# Patient Record
Sex: Male | Born: 1980 | Race: Black or African American | Hispanic: No | Marital: Single | Smoking: Current every day smoker
Health system: Southern US, Community
[De-identification: ages and names within clinical notes are randomized; demographics above are authoritative.]

## PROBLEM LIST (undated history)

## (undated) DIAGNOSIS — C801 Malignant (primary) neoplasm, unspecified: Secondary | ICD-10-CM

## (undated) DIAGNOSIS — K8689 Other specified diseases of pancreas: Secondary | ICD-10-CM

## (undated) DIAGNOSIS — Z8042 Family history of malignant neoplasm of prostate: Secondary | ICD-10-CM

## (undated) DIAGNOSIS — Z803 Family history of malignant neoplasm of breast: Secondary | ICD-10-CM

## (undated) HISTORY — PX: OTHER SURGICAL HISTORY: SHX169

## (undated) HISTORY — DX: Family history of malignant neoplasm of prostate: Z80.42

## (undated) HISTORY — DX: Family history of malignant neoplasm of breast: Z80.3

---

## 2016-07-03 ENCOUNTER — Encounter (HOSPITAL_COMMUNITY): Payer: Self-pay

## 2016-07-03 ENCOUNTER — Emergency Department (HOSPITAL_COMMUNITY)
Admission: EM | Admit: 2016-07-03 | Discharge: 2016-07-03 | Disposition: A | Discharge status: Home / Self Care | Payer: Self-pay | Attending: Emergency Medicine | Admitting: Emergency Medicine

## 2016-07-03 DIAGNOSIS — K0889 Other specified disorders of teeth and supporting structures: Secondary | ICD-10-CM | POA: Insufficient documentation

## 2016-07-03 DIAGNOSIS — F17210 Nicotine dependence, cigarettes, uncomplicated: Secondary | ICD-10-CM | POA: Insufficient documentation

## 2016-07-03 MED ORDER — PENICILLIN V POTASSIUM 500 MG PO TABS: 500.0000 mg | ORAL_TABLET | Freq: Four times a day (QID) | ORAL | 0 refills | Status: AC

## 2016-07-03 MED ORDER — IBUPROFEN 400 MG PO TABS
600.0000 mg | ORAL_TABLET | Freq: Once | ORAL | Status: AC
  Administered 2016-07-03: 15:00:00 600 mg via ORAL
  Filled 2016-07-03: qty 1

## 2016-07-03 NOTE — ED Triage Notes (Signed)
Patient complains of left upper dental pain x 2 days, broken tooth to same. Has been taking ibuprofen with minimal relief. NAD.

## 2016-07-03 NOTE — Discharge Instructions (Signed)
Read the information below.  You are being treated for a possible dental infection. You are being prescribed antibiotics, please take as directed.  Apply cool compresses to affected area; Tylenol as well as Aleve per package instructions for pain relief; warm salt water gargles or OTC Orajel for symptomatic relief.  Please follow up with dentist, contact information provided, please notify dentist office you were referred from Emergency Department.  Use the prescribed medication as directed.  Please discuss all new medications with your pharmacist.   You may return to the Emergency Department at any time for worsening condition or any new symptoms that concern you. Return if develop fever, unable to open mouth, visual changes, drooling, neck stiffness, or any other concerning symptoms.

## 2016-07-03 NOTE — ED Notes (Signed)
c/o left upper dental pain x 2-3 days. Woke this am left facial swelling.

## 2016-07-03 NOTE — ED Provider Notes (Signed)
[facility] DEPT Provider Note    By signing my name below, I, Bea Graff, attest that this documentation has been prepared under the direction and in the presence of Gay Filler, PA-C. Electronically Signed: Bea Graff, ED Scribe. 07/03/16. 3:32 PM.    History   Chief Complaint No chief complaint on file.  The history is provided by the patient and medical records. No language interpreter was used.    Gabriel [name] is a 36 y.o. male who presents to the Emergency Department complaining of left upper dental pain that began two days ago. He reports associated sharp, shooting pain. History of fractured tooth in area. He also reports some left sided facial swelling that appeared yesterday. He has taken Motrin and Aleve for pain with temporary relief. There are no modifying factors noted. He denies fever, chills, drooling, difficulty breathing or swallowing, nausea, vomiting. He denies any known medication allergies.   History reviewed. No pertinent past medical history.  There are no active problems to display for this patient.   History reviewed. No pertinent surgical history.     Home Medications    Prior to Admission medications   Medication Sig Start Date End Date Taking? Authorizing Provider  penicillin v potassium (VEETID) 500 MG tablet Take 1 tablet (500 mg total) by mouth 4 (four) times daily. 07/03/16 07/10/16  Frederica Kuster, PA-C    Family History No family history on file.  Social History Social History  Substance Use Topics  . Smoking status: Current Some Day Smoker    Types: E-cigarettes  . Smokeless tobacco: Never Used  . Alcohol use Not on file     Allergies   Patient has no known allergies.   Review of Systems Review of Systems  Constitutional: Negative for chills and fever.  HENT: Positive for dental problem. Negative for drooling and trouble swallowing.   Respiratory: Negative for shortness of breath.   Gastrointestinal:  Negative for nausea and vomiting.  Allergic/Immunologic: Negative for immunocompromised state.     Physical Exam Updated Vital Signs BP 113/80 (BP Location: Left Arm)   Pulse 85   Temp 98.5 F (36.9 C) (Oral)   Resp 18   Ht 5\' 8"  (1.727 m)   Wt 120 lb (54.4 kg)   SpO2 100%   BMI 18.25 kg/m   Physical Exam  Constitutional: He appears well-developed and well-nourished.  HENT:  Head: Normocephalic and atraumatic.  Mouth/Throat: Uvula is midline, oropharynx is clear and moist and mucous membranes are normal. No trismus in the jaw. Abnormal dentition. Dental caries present. No dental abscesses.  Partially edentulous. Multiple dental carries. Fractured tooth #16 with tenderness to palpation. No obvious abscess. No trismus. Uvula rises symmetrically. Managing oral secretions. Left maxillary facial swelling.  Eyes: Conjunctivae are normal. Pupils are equal, round, and reactive to light. Right eye exhibits no discharge. Left eye exhibits no discharge. No scleral icterus.  Neck: Normal range of motion. Neck supple. No neck rigidity. Normal range of motion present.  Cardiovascular: Normal rate, regular rhythm and normal heart sounds.   Pulmonary/Chest: Effort normal and breath sounds normal. No respiratory distress.  Musculoskeletal: Normal range of motion.  Lymphadenopathy:    He has no cervical adenopathy.  Neurological: He is alert.  Skin: Skin is warm and dry.  Psychiatric: He has a normal mood and affect. His behavior is normal.  Nursing note and vitals reviewed.    ED Treatments / Results  DIAGNOSTIC STUDIES: Oxygen Saturation is 100% on RA, normal by my interpretation.  COORDINATION OF CARE: 3:29 PM- Will give dental referral. Advised pt to alternate OTC Tylenol and Motrin. Will prescribe antibiotic. Pt verbalizes understanding and agrees to plan.  Medications  ibuprofen (ADVIL,MOTRIN) tablet 600 mg (600 mg Oral Given 07/03/16 1513)    Labs (all labs ordered are listed,  but only abnormal results are displayed) Labs Reviewed - No data to display  EKG  EKG Interpretation None       Radiology No results found.  Procedures Procedures (including critical care time)  Medications Ordered in ED Medications  ibuprofen (ADVIL,MOTRIN) tablet 600 mg (600 mg Oral Given 07/03/16 1513)     Initial Impression / Assessment and Plan / ED Course  I have reviewed the triage vital signs and the nursing notes.  Pertinent labs & imaging results that were available during my care of the patient were reviewed by me and considered in my medical decision making (see chart for details).     Patient presents to ED with complaint of left upper dental pain and left facial swelling x 2 days. H/o fracture left upper tooth has had dental infections in the past. Denies fever, trouble swallowing, trouble breathing, nausea, vomiting, myalgias. Patient is afebrile and non-toxic appearing in NAD. VSS. Heart RRR. Lungs CTABL. Abdomen soft, non-tender. Partially edentulous with multiple dental caries. Fracture tooth #16 with TTP. No gross abscess. Mild left maxillary facial swelling. No trismus. Uvula midline and rises symmetrically. Managing oral secretions. Neck ROM intact. Low suspicion for Ludwig's angina or spread of infection.  Will treat with penicillin and pain medicine.  Urged patient to follow-up with dentist, resource provided. ED return precautions given. Patient voiced understanding and is agreeable.    I personally performed the services described in this documentation, which was scribed in my presence. The recorded information has been reviewed and is accurate.   Final Clinical Impressions(s) / ED Diagnoses   Final diagnoses:  Pain, dental    New Prescriptions Discharge Medication List as of 07/03/2016  3:35 PM    START taking these medications   Details  penicillin v potassium (VEETID) 500 MG tablet Take 1 tablet (500 mg total) by mouth 4 (four) times daily.,  Starting Sat 07/03/2016, Until Sat 07/10/2016, Print         Frederica Kuster, PA-C 07/03/16 [place], MD 07/04/16 [phone]

## 2016-07-03 NOTE — ED Notes (Addendum)
Started 3 days ago with left upper dental pain. Was taking Aleve. Started 2 days ago with increased pain and swelling to left face.

## 2016-07-03 NOTE — ED Notes (Signed)
Pt stable, understands discharge instructions, and reasons for return.   

## 2016-09-06 ENCOUNTER — Encounter (HOSPITAL_COMMUNITY): Payer: Self-pay | Admitting: Emergency Medicine

## 2016-09-06 DIAGNOSIS — M79642 Pain in left hand: Secondary | ICD-10-CM | POA: Insufficient documentation

## 2016-09-06 DIAGNOSIS — F17210 Nicotine dependence, cigarettes, uncomplicated: Secondary | ICD-10-CM | POA: Insufficient documentation

## 2016-09-06 DIAGNOSIS — L089 Local infection of the skin and subcutaneous tissue, unspecified: Secondary | ICD-10-CM | POA: Insufficient documentation

## 2016-09-06 NOTE — ED Triage Notes (Signed)
Pt c/o of swelling of the left hand.  Denies any injury.

## 2016-09-07 ENCOUNTER — Emergency Department (HOSPITAL_COMMUNITY): Payer: Self-pay

## 2016-09-07 ENCOUNTER — Emergency Department (HOSPITAL_COMMUNITY)
Admission: EM | Admit: 2016-09-07 | Discharge: 2016-09-07 | Disposition: A | Discharge status: Home / Self Care | Payer: Self-pay | Attending: Emergency Medicine | Admitting: Emergency Medicine

## 2016-09-07 DIAGNOSIS — L089 Local infection of the skin and subcutaneous tissue, unspecified: Secondary | ICD-10-CM

## 2016-09-07 DIAGNOSIS — M79642 Pain in left hand: Secondary | ICD-10-CM

## 2016-09-07 MED ORDER — OXYCODONE-ACETAMINOPHEN 5-325 MG PO TABS
1.0000 | ORAL_TABLET | Freq: Once | ORAL | Status: AC
  Administered 2016-09-07: 1 via ORAL
  Filled 2016-09-07: qty 1

## 2016-09-07 MED ORDER — CLINDAMYCIN HCL 150 MG PO CAPS: 300.0000 mg | ORAL_CAPSULE | Freq: Four times a day (QID) | ORAL | 0 refills | Status: AC

## 2016-09-07 MED ORDER — CLINDAMYCIN HCL 150 MG PO CAPS
300.0000 mg | ORAL_CAPSULE | Freq: Once | ORAL | Status: AC
  Administered 2016-09-07: 300 mg via ORAL
  Filled 2016-09-07: qty 2

## 2016-09-07 NOTE — ED Provider Notes (Signed)
[facility] DEPT Provider Note   CSN: [id number] Arrival date & time: 09/06/16  2233     History   Chief Complaint Chief Complaint  Patient presents with  . Hand Pain    HPI Gabriel [name] is a 36 y.o. male who presents with redness, pain, and swelling over the base of the second finger on the left hand. He reports the symptoms have been constant and worsening since onset. He denies recent trauma or injury. He denies fever, chills, right hand pain, or left elbow pain. No treatment prior to arrival. No history of similar symptoms. No pertinent past medical history, including gout.  The history is provided by the patient. No language interpreter was used.    History reviewed. No pertinent past medical history.  There are no active problems to display for this patient.   History reviewed. No pertinent surgical history.     Home Medications    Prior to Admission medications   Medication Sig Start Date End Date Taking? Authorizing Provider  clindamycin (CLEOCIN) 150 MG capsule Take 2 capsules (300 mg total) by mouth every 6 (six) hours. 09/07/16 09/12/16  Bayron Dalto, Laymond Purser, PA-C    Family History History reviewed. No pertinent family history.  Social History Social History  Substance Use Topics  . Smoking status: Current Some Day Smoker    Types: E-cigarettes  . Smokeless tobacco: Never Used  . Alcohol use No     Allergies   Penicillins   Review of Systems Review of Systems  Constitutional: Negative for activity change.  Respiratory: Negative for shortness of breath.   Cardiovascular: Negative for chest pain.  Gastrointestinal: Negative for abdominal pain.  Musculoskeletal: Positive for arthralgias, joint swelling and myalgias. Negative for back pain.  Skin: Negative for rash.     Physical Exam Updated Vital Signs BP 124/82 (BP Location: Right Arm)   Pulse 62   Temp 97.4 F (36.3 C) (Oral)   Resp 16   Ht 5\' 8"  (1.727 m)   Wt 56.7 kg (125 lb)   SpO2  99%   BMI 19.01 kg/m   Physical Exam  Constitutional: He appears well-developed.  HENT:  Head: Normocephalic.  Eyes: Conjunctivae are normal.  Neck: Neck supple.  Cardiovascular: Normal rate, regular rhythm and normal heart sounds.  Exam reveals no gallop and no friction rub.   No murmur heard. Pulmonary/Chest: Effort normal and breath sounds normal. No respiratory distress. He has no wheezes. He has no rales.  Abdominal: Soft. He exhibits no distension. There is no tenderness.  Musculoskeletal: Normal range of motion. He exhibits tenderness. He exhibits no deformity.  Erythema, warmth, swelling and tenderness to palpation over the base of the second metacarpal on the left hand. Good strength of the digit against resistance. Sensation is intact. Able to move all digits. Full range of motion of the wrist, elbow, and left shoulder.   Neurological: He is alert.  Skin: Skin is warm and dry. Capillary refill takes less than 2 seconds. There is erythema.  Psychiatric: His behavior is normal.  Nursing note and vitals reviewed.  ED Treatments / Results  Labs (all labs ordered are listed, but only abnormal results are displayed) Labs Reviewed - No data to display  EKG  EKG Interpretation None       Radiology Dg Hand Complete Left  Result Date: 09/07/2016 CLINICAL DATA:  Erythema and pain over the second metacarpal. EXAM: LEFT HAND - COMPLETE 3+ VIEW COMPARISON:  None. FINDINGS: There is no evidence of fracture  or dislocation. Mild soft tissue swelling over the dorsum of the metacarpals. There is no evidence of arthropathy or other focal bone abnormality. IMPRESSION: Soft tissue swelling over the metacarpals without underlying fracture, dislocation or bone destruction. Question soft tissue infection/cellulitis. Electronically Signed   By: Ashley Royalty M.D.   On: 09/07/2016 03:22    Procedures Procedures (including critical care time)  Medications Ordered in ED Medications    oxyCODONE-acetaminophen (PERCOCET/ROXICET) 5-325 MG per tablet 1 tablet (1 tablet Oral Given 09/07/16 0240)  clindamycin (CLEOCIN) capsule 300 mg (300 mg Oral Given 09/07/16 0422)     Initial Impression / Assessment and Plan / ED Course  I have reviewed the triage vital signs and the nursing notes.  Pertinent labs & imaging results that were available during my care of the patient were reviewed by me and considered in my medical decision making (see chart for details).     Patient has x-ray of the left hand demonstrating soft tissue swelling over the metacarpals without underlying fracture, dislocation, or bone destruction with questionable soft tissue infection/cellulitis. Low suspicion for gout or septic joint given patient's age and risk factors. Pt is without risk factors for HIV; no recent use of steroids or other immunosuppressive medications; no Hx of diabetes.  Pt is without gross abscess for which I&D would be possible.   Pt is alert, oriented, NAD, afebrile, non tachycardic, nonseptic and nontoxic appearing.  Pt to be d/c on oral antibiotics with referral to [facility] and wellness.  First dose of antibiotics given in the ED. Strict return precautions given. VSS. Patient is stable for discharge at this time.   Final Clinical Impressions(s) / ED Diagnoses   Final diagnoses:  Left hand pain  Soft tissue infection    New Prescriptions Discharge Medication List as of 09/07/2016  4:19 AM    START taking these medications   Details  clindamycin (CLEOCIN) 150 MG capsule Take 2 capsules (300 mg total) by mouth every 6 (six) hours., Starting Tue 09/07/2016, Until Sun 09/12/2016, Print         Peniel Hass, [place] A, PA-C 09/08/16 8299    Orpah Greek, MD 09/08/16 [phone]

## 2016-09-07 NOTE — Discharge Instructions (Signed)
Clindamycin is an antibiotic and needs to be taken four times per day (every 6 hours). Please take two pills for each dose. Your first dose has been given here. Please call [facility] in the morning to schedule a recheck of the wound in 3-5 days. If swelling worsens down the rest of finger, he develop fever chills, or if your current symptoms worsen, please return to the emergency department for reevaluation. Please take all of her antibiotics in a few start until better. You can control your pain and swelling by taking ibuprofen or Tylenol. He can also apply ice to the left hand to help with swelling.

## 2016-09-07 NOTE — ED Notes (Signed)
Patient transported to X-ray 
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
## 2017-06-15 ENCOUNTER — Emergency Department (HOSPITAL_COMMUNITY)
Admission: EM | Admit: 2017-06-15 | Discharge: 2017-06-15 | Disposition: A | Discharge status: Home / Self Care | Payer: Self-pay | Attending: Emergency Medicine | Admitting: Emergency Medicine

## 2017-06-15 ENCOUNTER — Encounter (HOSPITAL_COMMUNITY): Payer: Self-pay | Admitting: *Deleted

## 2017-06-15 ENCOUNTER — Other Ambulatory Visit: Payer: Self-pay

## 2017-06-15 DIAGNOSIS — K529 Noninfective gastroenteritis and colitis, unspecified: Secondary | ICD-10-CM | POA: Insufficient documentation

## 2017-06-15 DIAGNOSIS — F17210 Nicotine dependence, cigarettes, uncomplicated: Secondary | ICD-10-CM | POA: Insufficient documentation

## 2017-06-15 LAB — CBC
HEMATOCRIT: 44.8 % (ref 39.0–52.0)
HEMOGLOBIN: 15.2 g/dL (ref 13.0–17.0)
MCH: 30 pg (ref 26.0–34.0)
MCHC: 33.9 g/dL (ref 30.0–36.0)
MCV: 88.5 fL (ref 78.0–100.0)
PLATELETS: 238 10*3/uL (ref 150–400)
RBC: 5.06 MIL/uL (ref 4.22–5.81)
RDW: 13.6 % (ref 11.5–15.5)
WBC: 3.5 10*3/uL — ABNORMAL LOW (ref 4.0–10.5)

## 2017-06-15 LAB — DIFFERENTIAL
Basophils Absolute: 0 10*3/uL (ref 0.0–0.1)
Basophils Relative: 1 %
Eosinophils Absolute: 0 10*3/uL (ref 0.0–0.7)
Eosinophils Relative: 1 %
Lymphocytes Relative: 24 %
Lymphs Abs: 0.9 10*3/uL (ref 0.7–4.0)
Monocytes Absolute: 0.2 10*3/uL (ref 0.1–1.0)
Monocytes Relative: 7 %
Neutro Abs: 2.5 10*3/uL (ref 1.7–7.7)
Neutrophils Relative %: 67 %

## 2017-06-15 LAB — COMPREHENSIVE METABOLIC PANEL
ALT: 17 U/L (ref 17–63)
AST: 17 U/L (ref 15–41)
Albumin: 3.9 g/dL (ref 3.5–5.0)
Alkaline Phosphatase: 78 U/L (ref 38–126)
Anion gap: 10 (ref 5–15)
BUN: 5 mg/dL — ABNORMAL LOW (ref 6–20)
CHLORIDE: 103 mmol/L (ref 101–111)
CO2: 26 mmol/L (ref 22–32)
CREATININE: 0.98 mg/dL (ref 0.61–1.24)
Calcium: 9.1 mg/dL (ref 8.9–10.3)
GFR calc non Af Amer: >60 mL/min (ref >60)
Glucose, Bld: 93 mg/dL (ref 65–99)
POTASSIUM: 3.9 mmol/L (ref 3.5–5.1)
SODIUM: 139 mmol/L (ref 135–145)
Total Bilirubin: 0.8 mg/dL (ref 0.3–1.2)
Total Protein: 7.3 g/dL (ref 6.5–8.1)

## 2017-06-15 LAB — LIPASE, BLOOD: LIPASE: 24 U/L (ref 11–51)

## 2017-06-15 MED ORDER — SODIUM CHLORIDE 0.9 % IV BOLUS (SEPSIS)
1000.0000 mL | Freq: Once | INTRAVENOUS | Status: AC
  Administered 2017-06-15: 1000 mL via INTRAVENOUS

## 2017-06-15 MED ORDER — ONDANSETRON HCL 4 MG/2ML IJ SOLN
4.0000 mg | Freq: Once | INTRAMUSCULAR | Status: AC
  Administered 2017-06-15: 4 mg via INTRAVENOUS
  Filled 2017-06-15: qty 2

## 2017-06-15 MED ORDER — PROMETHAZINE HCL 25 MG PO TABS: 25.0000 mg | ORAL_TABLET | Freq: Four times a day (QID) | ORAL | 0 refills | Status: DC | PRN

## 2017-06-15 NOTE — ED Provider Notes (Signed)
[facility] EMERGENCY DEPARTMENT Provider Note   CSN: [id number] Arrival date & time: 06/15/17  1240     History   Chief Complaint Chief Complaint  Patient presents with  . Diarrhea    HPI Gabriel [name] is a 37 y.o. male.  HPI Patient presents to the emergency department with nausea vomiting and diarrhea that started yesterday.  The patient states that 2 of his children had similar symptoms.  Patient states that he did not take any medications prior to arrival.  The patient states that nothing seems to make the condition better or worse.  The patient states that he has not vomited since yesterday but continues to have some episodes of diarrhea.  The patient denies chest pain, shortness of breath, headache,blurred vision, neck pain, fever, cough, weakness, numbness, dizziness, anorexia, edema,  rash, back pain, dysuria, hematemesis, bloody stool, near syncope, or syncope. History reviewed. No pertinent past medical history.  There are no active problems to display for this patient.   History reviewed. No pertinent surgical history.     Home Medications    Prior to Admission medications   Not on File    Family History No family history on file.  Social History Social History   Tobacco Use  . Smoking status: Current Some Day Smoker    Types: E-cigarettes  . Smokeless tobacco: Never Used  Substance Use Topics  . Alcohol use: No  . Drug use: No     Allergies   Penicillins   Review of Systems Review of Systems All other systems negative except as documented in the HPI. All pertinent positives and negatives as reviewed in the HPI.  Physical Exam Updated Vital Signs BP (!) 114/53   Pulse (!) 55   Temp 98.7 F (37.1 C) (Oral)   Resp 14   Ht 5\' 8"  (1.727 m)   Wt 56.7 kg (125 lb)   SpO2 100%   BMI 19.01 kg/m   Physical Exam  Constitutional: He is oriented to person, place, and time. He appears well-developed and well-nourished. No  distress.  HENT:  Head: Normocephalic and atraumatic.  Mouth/Throat: Oropharynx is clear and moist.  Eyes: Pupils are equal, round, and reactive to light.  Neck: Normal range of motion. Neck supple.  Cardiovascular: Normal rate, regular rhythm and normal heart sounds. Exam reveals no gallop and no friction rub.  No murmur heard. Pulmonary/Chest: Effort normal and breath sounds normal. No respiratory distress. He has no wheezes.  Abdominal: Soft. Bowel sounds are normal. He exhibits no distension. There is no tenderness. There is no rebound and no guarding.  Neurological: He is alert and oriented to person, place, and time. He exhibits normal muscle tone. Coordination normal.  Skin: Skin is warm and dry. Capillary refill takes less than 2 seconds. No rash noted. No erythema.  Psychiatric: He has a normal mood and affect. His behavior is normal.  Nursing note and vitals reviewed.    ED Treatments / Results  Labs (all labs ordered are listed, but only abnormal results are displayed) Labs Reviewed  COMPREHENSIVE METABOLIC PANEL - Abnormal; Notable for the following components:      Result Value   BUN 5 (*)    All other components within normal limits  CBC - Abnormal; Notable for the following components:   WBC 3.5 (*)    All other components within normal limits  LIPASE, BLOOD  DIFFERENTIAL    EKG  EKG Interpretation None  Radiology No results found.  Procedures Procedures (including critical care time)  Medications Ordered in ED Medications  sodium chloride 0.9 % bolus 1,000 mL (1,000 mLs Intravenous New Bag/Given 06/15/17 1824)  ondansetron (ZOFRAN) injection 4 mg (4 mg Intravenous Given 06/15/17 1823)     Initial Impression / Assessment and Plan / ED Course  I have reviewed the triage vital signs and the nursing notes.  Pertinent labs & imaging results that were available during my care of the patient were reviewed by me and considered in my medical decision  making (see chart for details).     Patient has been hydrated and given antiemetics the patient feels very good at this time and states that he would like to be discharged home.  Patient passed an oral trial of ginger ale.  Patient is advised to return here as needed.  The patient agrees the plan and all questions were answered.  Final Clinical Impressions(s) / ED Diagnoses   Final diagnoses:  None    ED Discharge Orders    None       Dalia Heading, PA-C 06/16/17 0111    Daleen Bo, MD 06/16/17 [phone]

## 2017-06-15 NOTE — ED Triage Notes (Signed)
Pt states headache, diarrhea, chills, emesis since yesterday.

## 2017-06-15 NOTE — Discharge Instructions (Signed)
Return here as needed. Slowly increase your fluid intake. Rest as much as possible.

## 2017-06-15 NOTE — ED Provider Notes (Signed)
Patient placed in Quick Look pathway, seen and evaluated   Chief Complaint: Nausea, vomiting, diarrhea  HPI:   Patient presents with acute onset of nausea, vomiting, and diarrhea which began yesterday.  He notes 4 episodes of nonbloody watery diarrhea today and one episode of nonbloody emesis today.  No fevers but does note chills.  Notes a "tightness "to his upper abdomen but otherwise no pain.  States his last oral intake prior to symptom onset were ox tails and a [place] sandwich from [employer].  Also notes a frontal throbbing headache. ROS: Positive for headaches, nausea, vomiting, diarrhea, abdominal pain, negative for fevers  Physical Exam:   Gen: No distress  Neuro: Awake and Alert  Skin: Warm    Focused Exam: Bowel sounds present in all 4 quadrants.  Abdomen is soft.  No distention.  Murphy sign absent, Rovsing's absent.  He has mild tenderness to palpation in the periumbilical region.  Patient is nontoxic in appearance.   Initiation of care has begun. The patient has been counseled on the process, plan, and necessity for staying for the completion/evaluation, and the remainder of the medical screening examination    Renita Papa, PA-C 06/15/17 1344    Isla Pence, MD 06/15/17 [phone]

## 2018-08-29 ENCOUNTER — Encounter (HOSPITAL_COMMUNITY): Payer: Self-pay | Admitting: Emergency Medicine

## 2018-08-29 ENCOUNTER — Other Ambulatory Visit: Payer: Self-pay

## 2018-08-29 ENCOUNTER — Emergency Department (HOSPITAL_COMMUNITY)
Admission: EM | Admit: 2018-08-29 | Discharge: 2018-08-30 | Disposition: A | Discharge status: Home / Self Care | Payer: Self-pay | Attending: Emergency Medicine | Admitting: Emergency Medicine

## 2018-08-29 DIAGNOSIS — F17290 Nicotine dependence, other tobacco product, uncomplicated: Secondary | ICD-10-CM | POA: Insufficient documentation

## 2018-08-29 DIAGNOSIS — Y929 Unspecified place or not applicable: Secondary | ICD-10-CM | POA: Insufficient documentation

## 2018-08-29 DIAGNOSIS — Y999 Unspecified external cause status: Secondary | ICD-10-CM | POA: Insufficient documentation

## 2018-08-29 DIAGNOSIS — Y939 Activity, unspecified: Secondary | ICD-10-CM | POA: Insufficient documentation

## 2018-08-29 DIAGNOSIS — X58XXXA Exposure to other specified factors, initial encounter: Secondary | ICD-10-CM | POA: Insufficient documentation

## 2018-08-29 DIAGNOSIS — S39012A Strain of muscle, fascia and tendon of lower back, initial encounter: Secondary | ICD-10-CM | POA: Insufficient documentation

## 2018-08-29 LAB — COMPREHENSIVE METABOLIC PANEL
ALT: 33 U/L (ref 0–44)
AST: 21 U/L (ref 15–41)
Albumin: 4 g/dL (ref 3.5–5.0)
Alkaline Phosphatase: 71 U/L (ref 38–126)
Anion gap: 12 (ref 5–15)
BUN: 7 mg/dL (ref 6–20)
CO2: 27 mmol/L (ref 22–32)
Calcium: 9.4 mg/dL (ref 8.9–10.3)
Chloride: 101 mmol/L (ref 98–111)
Creatinine, Ser: 1 mg/dL (ref 0.61–1.24)
GFR calc Af Amer: >60 mL/min (ref >60)
GFR calc non Af Amer: >60 mL/min (ref >60)
Glucose, Bld: 103 mg/dL — ABNORMAL HIGH (ref 70–99)
Potassium: 3.6 mmol/L (ref 3.5–5.1)
Sodium: 140 mmol/L (ref 135–145)
Total Bilirubin: 0.7 mg/dL (ref 0.3–1.2)
Total Protein: 7 g/dL (ref 6.5–8.1)

## 2018-08-29 LAB — CBC
HCT: 48.3 % (ref 39.0–52.0)
Hemoglobin: 15.7 g/dL (ref 13.0–17.0)
MCH: 28.9 pg (ref 26.0–34.0)
MCHC: 32.5 g/dL (ref 30.0–36.0)
MCV: 89 fL (ref 80.0–100.0)
Platelets: 266 10*3/uL (ref 150–400)
RBC: 5.43 MIL/uL (ref 4.22–5.81)
RDW: 12.9 % (ref 11.5–15.5)
WBC: 7.2 10*3/uL (ref 4.0–10.5)
nRBC: 0 % (ref 0.0–0.2)

## 2018-08-29 LAB — LIPASE, BLOOD: Lipase: 28 U/L (ref 11–51)

## 2018-08-29 MED ORDER — SODIUM CHLORIDE 0.9% FLUSH: 3.0000 mL | Freq: Once | INTRAVENOUS | Status: DC

## 2018-08-29 NOTE — ED Triage Notes (Signed)
Pt reporting RLQ pain with radiation around to his back. He reports vomiting 3 times and is unsure if he has a fever. Denies travel or contact with anyone with symptoms.

## 2018-08-30 LAB — URINALYSIS, ROUTINE W REFLEX MICROSCOPIC
Bilirubin Urine: NEGATIVE
Glucose, UA: NEGATIVE mg/dL
Hgb urine dipstick: NEGATIVE
Ketones, ur: 5 mg/dL — AB
Leukocytes,Ua: NEGATIVE
Nitrite: NEGATIVE
Protein, ur: NEGATIVE mg/dL
Specific Gravity, Urine: 1.029 (ref 1.005–1.030)
pH: 5 (ref 5.0–8.0)

## 2018-08-30 MED ORDER — KETOROLAC TROMETHAMINE 60 MG/2ML IM SOLN
30.0000 mg | Freq: Once | INTRAMUSCULAR | Status: AC
  Administered 2018-08-30: 30 mg via INTRAMUSCULAR
  Filled 2018-08-30: qty 2

## 2018-08-30 NOTE — ED Notes (Signed)
Patient verbalizes understanding of discharge instructions. Opportunity for questioning and answers were provided. Armband removed by staff, pt discharged from ED ambulatory.   

## 2018-08-30 NOTE — ED Notes (Signed)
UA sent with culture. Pt stated when he urinated he felt a burning sensation and saw "a few drops of blood."

## 2018-08-30 NOTE — ED Notes (Signed)
Pt stated he needs to drink more in order to give UA. Gave pt soda and he said he would drink and try again

## 2018-08-30 NOTE — Discharge Instructions (Signed)
You may use over-the-counter Motrin (Ibuprofen), Acetaminophen (Tylenol), topical muscle creams such as SalonPas, Icy Hot, Bengay, etc. Please stretch, apply heat, and have massage therapy for additional assistance. ° °

## 2018-08-30 NOTE — ED Notes (Signed)
Gave pt urinal for u/a collection

## 2018-08-30 NOTE — ED Notes (Signed)
Gave pt water to drink, encouraged oral hydration

## 2018-08-30 NOTE — ED Provider Notes (Signed)
[facility] EMERGENCY DEPARTMENT Provider Note  CSN: [id number] Arrival date & time: 08/29/18 1901  Chief Complaint(s) Abdominal Pain  HPI Gabriel [name] is a 38 y.o. male with no apparent past medical history presents to the emergency department with 2 weeks of intermittent right-sided lower back and flank pain that is been intermittent since onset.  Exacerbated with movement and palpation of the right paraspinal muscle groups of the lumbar region. Pain improved with certain positions. Denies any trauma.  No urinary symptoms. No bladder/bowel incontinence. No lower extremity weakness. Denied any prior nausea, vomiting, or diarrhea, but reports that today he felt that he had to use the bathroom.  During that episode while straining, he felt nauseated and had 3 episodes of nonbloody nonbilious emesis.  Denies any other abdominal pain.  No chest pain or shortness of breath.  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   HPI  Past Medical History History reviewed. No pertinent past medical history. There are no active problems to display for this patient.  Home Medication(s) Prior to Admission medications   Medication Sig Start Date End Date Taking? Authorizing Provider  promethazine (PHENERGAN) 25 MG tablet Take 1 tablet (25 mg total) by mouth every 6 (six) hours as needed for nausea or vomiting. 06/15/17   Dalia Heading, PA-C                                                                                                                                    Past Surgical History History reviewed. No pertinent surgical history. Family History No family history on file.  Social History Social History   Tobacco Use  . Smoking status: Current Some Day Smoker    Types: E-cigarettes  . Smokeless tobacco: Never Used  Substance Use Topics  . Alcohol use: No  . Drug use: No   Allergies Penicillins  Review of  Systems Review of Systems All other systems are reviewed and are negative for acute change except as noted in the HPI  Physical Exam Vital Signs  I have reviewed the triage vital signs BP 106/66   Pulse (!) 59   Temp 98.1 F (36.7 C) (Oral)   Resp 16   SpO2 98%   Physical Exam Vitals signs reviewed.  Constitutional:      General: He is not in acute distress.    Appearance: He is well-developed. He is not diaphoretic.  HENT:     Head: Normocephalic and atraumatic.     Jaw: No trismus.     Right Ear: External ear normal.     Left Ear: External ear normal.     Nose: Nose normal.  Eyes:     General: No scleral icterus.    Conjunctiva/sclera: Conjunctivae normal.  Neck:     Musculoskeletal: Normal range of motion.     Trachea: Phonation normal.  Cardiovascular:  Rate and Rhythm: Normal rate and regular rhythm.  Pulmonary:     Effort: Pulmonary effort is normal. No respiratory distress.     Breath sounds: No stridor.  Abdominal:     General: There is no distension.     Tenderness: There is no abdominal tenderness. There is no right CVA tenderness, left CVA tenderness, guarding or rebound. Negative signs include Rovsing's sign, McBurney's sign, psoas sign and obturator sign.  Musculoskeletal: Normal range of motion.     Lumbar back: He exhibits tenderness. He exhibits no bony tenderness.       Back:  Neurological:     Mental Status: He is alert and oriented to person, place, and time.     Comments: Spine Exam: Strength: 5/5 throughout LE bilaterally Sensation: Intact to light touch in proximal and distal LE bilaterally Reflexes: 1+ quadriceps and achilles reflexes   Psychiatric:        Behavior: Behavior normal.     ED Results and Treatments Labs (all labs ordered are listed, but only abnormal results are displayed) Labs Reviewed  COMPREHENSIVE METABOLIC PANEL - Abnormal; Notable for the following components:      Result Value   Glucose, Bld 103 (*)    All  other components within normal limits  URINALYSIS, ROUTINE W REFLEX MICROSCOPIC - Abnormal; Notable for the following components:   APPearance HAZY (*)    Ketones, ur 5 (*)    All other components within normal limits  LIPASE, BLOOD  CBC                                                                                                                         EKG  EKG Interpretation  Date/Time:    Ventricular Rate:    PR Interval:    QRS Duration:   QT Interval:    QTC Calculation:   R Axis:     Text Interpretation:        Radiology No results found. Pertinent labs & imaging results that were available during my care of the patient were reviewed by me and considered in my medical decision making (see chart for details).  Medications Ordered in ED Medications  sodium chloride flush (NS) 0.9 % injection 3 mL (has no administration in time range)  ketorolac (TORADOL) injection 30 mg (30 mg Intramuscular Given 08/30/18 0049)  Procedures Procedures  (including critical care time)  Medical Decision Making / ED Course I have reviewed the nursing notes for this encounter and the patient's prior records (if available in EHR or on provided paperwork).    38 y.o. male presents with back pain in lumbar area for 2 weeks without signs of radicular pain. No acute traumatic onset. No red flag symptoms of fever, weight loss, saddle anesthesia, weakness, fecal/urinary incontinence or urinary retention.   Labs drawn in triage were grossly reassuring without leukocytosis or anemia.  No significant electrolyte derangements or renal sufficiency.  No evidence of bili obstruction or pancreatitis.  UA without evidence of infection.  Suspect MSK etiology. No indication for imaging emergently. Patient was recommended to take short course of scheduled NSAIDs and engage in early  mobility as definitive treatment. Return precautions discussed for worsening or new concerning symptoms.    Final Clinical Impression(s) / ED Diagnoses Final diagnoses:  Lumbar strain, initial encounter   Disposition: Discharge  Condition: Good  I have discussed the results, Dx and Tx plan with the patient who expressed understanding and agree(s) with the plan. Discharge instructions discussed at great length. The patient was given strict return precautions who verbalized understanding of the instructions. No further questions at time of discharge.    ED Discharge Orders    None       Follow Up: Primary care provider  Schedule an appointment as soon as possible for a visit  If you do not have a primary care physician, contact [facility] at [phone] for referral      This chart was dictated using voice recognition software.  Despite best efforts to proofread,  errors can occur which can change the documentation meaning.   Fatima Blank, MD 08/30/18 [phone]

## 2018-11-29 ENCOUNTER — Other Ambulatory Visit: Payer: Self-pay

## 2018-11-29 ENCOUNTER — Encounter (HOSPITAL_COMMUNITY): Payer: Self-pay | Admitting: Emergency Medicine

## 2018-11-29 ENCOUNTER — Emergency Department (HOSPITAL_COMMUNITY): Payer: Self-pay

## 2018-11-29 ENCOUNTER — Emergency Department (HOSPITAL_COMMUNITY)
Admission: EM | Admit: 2018-11-29 | Discharge: 2018-11-29 | Disposition: A | Discharge status: Home / Self Care | Payer: Self-pay | Attending: Emergency Medicine | Admitting: Emergency Medicine

## 2018-11-29 DIAGNOSIS — R1031 Right lower quadrant pain: Secondary | ICD-10-CM | POA: Insufficient documentation

## 2018-11-29 DIAGNOSIS — R109 Unspecified abdominal pain: Secondary | ICD-10-CM

## 2018-11-29 DIAGNOSIS — F17290 Nicotine dependence, other tobacco product, uncomplicated: Secondary | ICD-10-CM | POA: Insufficient documentation

## 2018-11-29 DIAGNOSIS — R748 Abnormal levels of other serum enzymes: Secondary | ICD-10-CM | POA: Insufficient documentation

## 2018-11-29 LAB — CBC WITH DIFFERENTIAL/PLATELET
Abs Immature Granulocytes: 0.01 10*3/uL (ref 0.00–0.07)
Basophils Absolute: 0 10*3/uL (ref 0.0–0.1)
Basophils Relative: 1 %
Eosinophils Absolute: 0 10*3/uL (ref 0.0–0.5)
Eosinophils Relative: 1 %
HCT: 48.2 % (ref 39.0–52.0)
Hemoglobin: 16 g/dL (ref 13.0–17.0)
Immature Granulocytes: 0 %
Lymphocytes Relative: 34 %
Lymphs Abs: 1.5 10*3/uL (ref 0.7–4.0)
MCH: 29.2 pg (ref 26.0–34.0)
MCHC: 33.2 g/dL (ref 30.0–36.0)
MCV: 88 fL (ref 80.0–100.0)
Monocytes Absolute: 0.3 10*3/uL (ref 0.1–1.0)
Monocytes Relative: 7 %
Neutro Abs: 2.5 10*3/uL (ref 1.7–7.7)
Neutrophils Relative %: 57 %
Platelets: 251 10*3/uL (ref 150–400)
RBC: 5.48 MIL/uL (ref 4.22–5.81)
RDW: 12.6 % (ref 11.5–15.5)
WBC: 4.3 10*3/uL (ref 4.0–10.5)
nRBC: 0 % (ref 0.0–0.2)

## 2018-11-29 LAB — URINALYSIS, ROUTINE W REFLEX MICROSCOPIC
Bilirubin Urine: NEGATIVE
Glucose, UA: NEGATIVE mg/dL
Hgb urine dipstick: NEGATIVE
Ketones, ur: NEGATIVE mg/dL
Leukocytes,Ua: NEGATIVE
Nitrite: NEGATIVE
Protein, ur: NEGATIVE mg/dL
Specific Gravity, Urine: 1.016 (ref 1.005–1.030)
pH: 7 (ref 5.0–8.0)

## 2018-11-29 LAB — LIPASE, BLOOD: Lipase: 65 U/L — ABNORMAL HIGH (ref 11–51)

## 2018-11-29 MED ORDER — ONDANSETRON HCL 4 MG PO TABS: 4.0000 mg | ORAL_TABLET | Freq: Four times a day (QID) | ORAL | 0 refills | Status: DC

## 2018-11-29 MED ORDER — OXYCODONE-ACETAMINOPHEN 5-325 MG PO TABS: 1.0000 | ORAL_TABLET | Freq: Once | ORAL | Status: DC

## 2018-11-29 MED ORDER — IOHEXOL 300 MG/ML  SOLN: 100.0000 mL | Freq: Once | INTRAMUSCULAR | Status: DC | PRN

## 2018-11-29 NOTE — ED Notes (Signed)
Unsuccessful IV attempts. Phlebotomy at bedside. IV consult ordered

## 2018-11-29 NOTE — Discharge Planning (Signed)
Molly Maselli J. Clydene Laming, RN, BSN, [place] [phone]  [facility] set up appointment with [place] on 9/10 @ 3:10.  Spoke with pt at bedside and advised to please arrive 15 min early and take a picture ID and your current medications.  Pt verbalizes understanding of keeping appointment.

## 2018-11-29 NOTE — Discharge Instructions (Signed)
Please read attached information. If you experience any new or worsening signs or symptoms please return to the emergency room for evaluation. Please follow-up with your primary care provider or specialist as discussed. Please use medication prescribed only as directed and discontinue taking if you have any concerning signs or symptoms.   °

## 2018-11-29 NOTE — ED Provider Notes (Signed)
[facility] EMERGENCY DEPARTMENT Provider Note   CSN: [id number] Arrival date & time: 11/29/18  [id number]     History   Chief Complaint Chief Complaint  Patient presents with   Flank Pain    HPI Gabriel [name] is a 38 y.o. male.     HPI    38 year old male presents today with complaints of abdominal flank pain.  Patient notes a 55-month history of right lower abdominal and flank discomfort.  He notes he is lost 30 pounds over the last month.  He notes nausea and vomiting frequently but not on a daily basis.  He notes his bowel movements are smaller and intermittently having diarrhea and hard stool.  Patient notes that he has been seen previously for this approximately 1 month ago with no significant findings this included a CT scan.  He notes he is sexually active with a male partner. He notes he does not drink alcohol.      History reviewed. No pertinent past medical history.  There are no active problems to display for this patient.   History reviewed. No pertinent surgical history.      Home Medications    Prior to Admission medications   Medication Sig Start Date End Date Taking? Authorizing Provider  ondansetron (ZOFRAN) 4 MG tablet Take 1 tablet (4 mg total) by mouth every 6 (six) hours. 11/29/18   Okey Regal, PA-C    Family History No family history on file.  Social History Social History   Tobacco Use   Smoking status: Current Some Day Smoker    Types: E-cigarettes   Smokeless tobacco: Never Used  Substance Use Topics   Alcohol use: No   Drug use: No     Allergies   Penicillins   Review of Systems Review of Systems  All other systems reviewed and are negative.  Physical Exam Updated Vital Signs BP 117/90 (BP Location: Right Arm)    Pulse 68    Temp 98.1 F (36.7 C) (Oral)    Resp 16    Ht 5\' 8"  (1.727 m)    Wt 52.6 kg    SpO2 100%    BMI 17.64 kg/m   Physical Exam Vitals signs and nursing note reviewed.    Constitutional:      Appearance: He is well-developed.     Comments: Cachectic appearance  HENT:     Head: Normocephalic and atraumatic.  Eyes:     General: No scleral icterus.       Right eye: No discharge.        Left eye: No discharge.     Conjunctiva/sclera: Conjunctivae normal.     Pupils: Pupils are equal, round, and reactive to light.  Neck:     Musculoskeletal: Normal range of motion.     Vascular: No JVD.     Trachea: No tracheal deviation.  Pulmonary:     Effort: Pulmonary effort is normal.     Breath sounds: No stridor.  Abdominal:     Comments: Minimal tenderness palpation of right lower quadrant no rebound guarding or masses  Neurological:     Mental Status: He is alert and oriented to person, place, and time.     Coordination: Coordination normal.  Psychiatric:        Behavior: Behavior normal.        Thought Content: Thought content normal.        Judgment: Judgment normal.    ED Treatments / Results  Labs (all labs ordered  are listed, but only abnormal results are displayed) Labs Reviewed  URINALYSIS, ROUTINE W REFLEX MICROSCOPIC - Abnormal; Notable for the following components:      Result Value   APPearance CLOUDY (*)    All other components within normal limits  LIPASE, BLOOD - Abnormal; Notable for the following components:   Lipase 65 (*)    All other components within normal limits  CBC WITH DIFFERENTIAL/PLATELET  HIV ANTIBODY (ROUTINE TESTING W REFLEX)  RPR    EKG None  Radiology Ct Abdomen Pelvis Wo Contrast  Result Date: 11/29/2018 CLINICAL DATA:  Abdominal pain, evaluate for appendicitis EXAM: CT ABDOMEN AND PELVIS WITHOUT CONTRAST TECHNIQUE: Multidetector CT imaging of the abdomen and pelvis was performed following the standard protocol without IV contrast. COMPARISON:  10/25/2018 FINDINGS: Lower chest: No acute abnormality. Hepatobiliary: No focal liver abnormality is seen. No gallstones, gallbladder wall thickening, or biliary  dilatation. Pancreas: Unremarkable. No pancreatic ductal dilatation or surrounding inflammatory changes. Spleen: Normal in size without focal abnormality. Adrenals/Urinary Tract: Adrenal glands are unremarkable. Kidneys are normal, without renal calculi, focal lesion, or hydronephrosis. Bladder is unremarkable. Stomach/Bowel: Stomach is within normal limits. Appendix is not visualized and is limited in overall evaluation secondary to lack of enteric contrast and peroneal fat. No evidence of bowel wall thickening, distention, or inflammatory changes. Vascular/Lymphatic: Normal caliber abdominal aorta with mild atherosclerosis. No lymphadenopathy. Reproductive: Prostate is unremarkable. Other: No abdominal wall hernia or abnormality. No abdominopelvic ascites. Musculoskeletal: No acute or significant osseous findings. IMPRESSION: 1. Appendix is not visualized and is limited in overall evaluation secondary to lack of enteric contrast and peroneal fat. If there is persistent concern regarding acute appendicitis, recommend repeat CT of the abdomen with oral and intravenous contrast. 2. Otherwise, no acute abdominal or pelvic pathology. 3.  Aortic Atherosclerosis (ICD10-I70.0). Electronically Signed   By: Kathreen Devoid   On: 11/29/2018 15:25    Procedures Procedures (including critical care time)  Medications Ordered in ED Medications  iohexol (OMNIPAQUE) 300 MG/ML solution 100 mL (has no administration in time range)  oxyCODONE-acetaminophen (PERCOCET/ROXICET) 5-325 MG per tablet 1 tablet (has no administration in time range)     Initial Impression / Assessment and Plan / ED Course  I have reviewed the triage vital signs and the nursing notes.  Pertinent labs & imaging results that were available during my care of the patient were reviewed by me and considered in my medical decision making (see chart for details).         Assessment/Plan: 38 year old male presents today with abdominal pain.  Patient  does appear to have lost weight.  He has tenderness to his abdomen worse in the right lower quadrant.  This has been several months of abdominal pain.  He is afebrile and has had a CT scan 1 month ago with no acute abnormalities.  Today CT scan was done with noncontrast as his IV had infiltrated there is terrible difficulty getting an IV and patient did not want to be stuck again.  They were unable to visualize the appendix and although he does have lower abdominal pain this is been chronic in nature he has a normal white count I have a low suspicion for acute appendicitis and again he did have a normal CT 1 month ago per the patient.  He also has slightly elevated lipase here today.  No signs of acute pancreatitis at this time.  Patient will need further evaluation and management for his ongoing weight loss.  He does not  have insurance I was able to arrange an appointment for him to be seen as an outpatient with his primary care to help with financial counseling and potential insurance options so that he may follow-up with a gastroenterologist.  Patient will follow-up as an outpatient he will return immediately if develops any new or worsening signs or symptoms.  He verbalized understanding and agreement to this plan had no further questions or concerns.   Final Clinical Impressions(s) / ED Diagnoses   Final diagnoses:  Abdominal pain, unspecified abdominal location  Elevated lipase    ED Discharge Orders         Ordered    ondansetron (ZOFRAN) 4 MG tablet  Every 6 hours     11/29/18 1651           Okey Regal, PA-C 11/29/18 1733    Julianne Rice, MD 12/04/18 2230

## 2018-11-29 NOTE — ED Triage Notes (Signed)
Pt arrives to ED with c/o of left flank pain for over 1 month that does not seem to be getting better- pt has been seen for this before but did not find the cause.

## 2018-11-29 NOTE — ED Notes (Signed)
IV team at bedside 

## 2018-11-29 NOTE — ED Notes (Signed)
ED Provider at bedside. 

## 2018-11-30 LAB — RPR: RPR Ser Ql: NONREACTIVE

## 2018-11-30 LAB — HIV ANTIBODY (ROUTINE TESTING W REFLEX): HIV Screen 4th Generation wRfx: NONREACTIVE

## 2018-12-14 ENCOUNTER — Ambulatory Visit (INDEPENDENT_AMBULATORY_CARE_PROVIDER_SITE_OTHER): Payer: Self-pay | Admitting: Primary Care

## 2018-12-14 ENCOUNTER — Other Ambulatory Visit: Payer: Self-pay

## 2018-12-14 ENCOUNTER — Encounter (INDEPENDENT_AMBULATORY_CARE_PROVIDER_SITE_OTHER): Payer: Self-pay | Admitting: Primary Care

## 2018-12-14 VITALS — BP 126/81 | HR 71 | Temp 98.2°F | Ht 68.0 in | Wt 94.8 lb

## 2018-12-14 DIAGNOSIS — F329 Major depressive disorder, single episode, unspecified: Secondary | ICD-10-CM

## 2018-12-14 DIAGNOSIS — K5900 Constipation, unspecified: Secondary | ICD-10-CM

## 2018-12-14 DIAGNOSIS — Z09 Encounter for follow-up examination after completed treatment for conditions other than malignant neoplasm: Secondary | ICD-10-CM

## 2018-12-14 DIAGNOSIS — Z7689 Persons encountering health services in other specified circumstances: Secondary | ICD-10-CM

## 2018-12-14 DIAGNOSIS — R634 Abnormal weight loss: Secondary | ICD-10-CM

## 2018-12-14 DIAGNOSIS — R1084 Generalized abdominal pain: Secondary | ICD-10-CM

## 2018-12-14 DIAGNOSIS — R112 Nausea with vomiting, unspecified: Secondary | ICD-10-CM

## 2018-12-14 DIAGNOSIS — F32A Depression, unspecified: Secondary | ICD-10-CM

## 2018-12-14 DIAGNOSIS — R109 Unspecified abdominal pain: Secondary | ICD-10-CM

## 2018-12-14 DIAGNOSIS — F17200 Nicotine dependence, unspecified, uncomplicated: Secondary | ICD-10-CM

## 2018-12-14 MED ORDER — CYCLOBENZAPRINE HCL 10 MG PO TABS: 10.0000 mg | ORAL_TABLET | Freq: Three times a day (TID) | ORAL | 1 refills | Status: DC | PRN

## 2018-12-14 MED ORDER — MELATONIN 5 MG PO TABS: 5.0000 mg | ORAL_TABLET | Freq: Every day | ORAL | 1 refills | Status: DC

## 2018-12-14 NOTE — Patient Instructions (Signed)
Acute Back Pain, Adult Acute back pain is sudden and usually short-lived. It is often caused by an injury to the muscles and tissues in the back. The injury may result from:  A muscle or ligament getting overstretched or torn (strained). Ligaments are tissues that connect bones to each other. Lifting something improperly can cause a back strain.  Wear and tear (degeneration) of the spinal disks. Spinal disks are circular tissue that provides cushioning between the bones of the spine (vertebrae).  Twisting motions, such as while playing sports or doing yard work.  A hit to the back.  Arthritis. You may have a physical exam, lab tests, and imaging tests to find the cause of your pain. Acute back pain usually goes away with rest and home care. Follow these instructions at home: Managing pain, stiffness, and swelling  Take over-the-counter and prescription medicines only as told by your health care provider.  Your health care provider may recommend applying ice during the first 24-48 hours after your pain starts. To do this: ? Put ice in a plastic bag. ? Place a towel between your skin and the bag. ? Leave the ice on for 20 minutes, 2-3 times a day.  If directed, apply heat to the affected area as often as told by your health care provider. Use the heat source that your health care provider recommends, such as a moist heat pack or a heating pad. ? Place a towel between your skin and the heat source. ? Leave the heat on for 20-30 minutes. ? Remove the heat if your skin turns bright red. This is especially important if you are unable to feel pain, heat, or cold. You have a greater risk of getting burned. Activity   Do not stay in bed. Staying in bed for more than 1-2 days can delay your recovery.  Sit up and stand up straight. Avoid leaning forward when you sit, or hunching over when you stand. ? If you work at a desk, sit close to it so you do not need to lean over. Keep your chin tucked  in. Keep your neck drawn back, and keep your elbows bent at a right angle. Your arms should look like the letter "L." ? Sit high and close to the steering wheel when you drive. Add lower back (lumbar) support to your car seat, if needed.  Take short walks on even surfaces as soon as you are able. Try to increase the length of time you walk each day.  Do not sit, drive, or stand in one place for more than 30 minutes at a time. Sitting or standing for long periods of time can put stress on your back.  Do not drive or use heavy machinery while taking prescription pain medicine.  Use proper lifting techniques. When you bend and lift, use positions that put less stress on your back: ? Bend your knees. ? Keep the load close to your body. ? Avoid twisting.  Exercise regularly as told by your health care provider. Exercising helps your back heal faster and helps prevent back injuries by keeping muscles strong and flexible.  Work with a physical therapist to make a safe exercise program, as recommended by your health care provider. Do any exercises as told by your physical therapist. Lifestyle  Maintain a healthy weight. Extra weight puts stress on your back and makes it difficult to have good posture.  Avoid activities or situations that make you feel anxious or stressed. Stress and anxiety increase muscle   tension and can make back pain worse. Learn ways to manage anxiety and stress, such as through exercise. General instructions  Sleep on a firm mattress in a comfortable position. Try lying on your side with your knees slightly bent. If you lie on your back, put a pillow under your knees.  Follow your treatment plan as told by your health care provider. This may include: ? Cognitive or behavioral therapy. ? Acupuncture or massage therapy. ? Meditation or yoga. Contact a health care provider if:  You have pain that is not relieved with rest or medicine.  You have increasing pain going down  into your legs or buttocks.  Your pain does not improve after 2 weeks.  You have pain at night.  You lose weight without trying.  You have a fever or chills. Get help right away if:  You develop new bowel or bladder control problems.  You have unusual weakness or numbness in your arms or legs.  You develop nausea or vomiting.  You develop abdominal pain.  You feel faint. Summary  Acute back pain is sudden and usually short-lived.  Use proper lifting techniques. When you bend and lift, use positions that put less stress on your back.  Take over-the-counter and prescription medicines and apply heat or ice as directed by your health care provider. This information is not intended to replace advice given to you by your health care provider. Make sure you discuss any questions you have with your health care provider. Document Released: 03/22/2005 Document Revised: 07/11/2018 Document Reviewed: 11/03/2016 Elsevier Patient Education  [place]. Preventing Complications From Unhealthy Weight Loss Behaviors, Adult Reaching and maintaining a healthy weight is important for your overall health. It is natural to want to lose weight quickly, using whatever methods seem fastest. However, losing weight in a healthy way is not a quick process. Instead, aim for slow, steady weight loss. What lifestyle changes can be made? You can make certain lifestyle changes to help you lose weight in a healthy way. These include eating nutritious foods and exercising regularly. What to avoid:  Following a diet that restricts entire types of food.  Skipping meals to save calories. It is especially important to eat breakfast.  Not eating anything for long periods of time (fasting).  Restricting your calories to far less than the amount that you need to lose or maintain a healthy weight.  Compulsively getting an extreme amount of exercise.  Taking laxative pills to make you have more frequent  bowel movements.  Taking medicines to make your body lose excess fluids (diuretics).  Eating an excessive amount of food (bingeing), then making yourself vomit (purging). Healthy behaviors:   Eat a variety of healthy foods, including: ? Fruits and vegetables. ? Whole grains. ? Lean proteins. ? Low-fat dairy products.  Drink water instead of sugary drinks.  Plan healthy, low-calorie meals. Work with a Financial planner (dietitian) to make a healthy meal plan that works for you and to make an exercise program. ? Include different types of exercise in your exercise program, such as strengthening, aerobic, and flexibility exercises. ? To maintain your weight, get at least 150 minutes of moderate-intensity exercise every week. Moderate-intensity exercise could be brisk walking or biking. ? To lose a healthy amount of weight, get 60 minutes of moderate-intensity exercise each day.  Find ways to reduce stress, such as regular exercise or meditation.  Find a hobby or other activity that you enjoy to distract you from eating when you  feel stressed or bored. Why are these changes important? Making these changes will improve your overall health. Maintaining a healthy weight also lowers your risk of certain conditions, including:  Heart disease.  High cholesterol.  High blood pressure.  Type 2 diabetes.  Stroke.  Osteoarthritis.  Osteoporosis.  Some types of cancer.  Breathing and sleeping disorders. What can happen if changes are not made? Using disordered eating or other unhealthy eating behaviors to try to lose weight can cause:  Fatigue.  Imbalances in electrolytes and chemicals that your body needs to work properly.  Organ damage or failure, especially of the kidneys.  Dehydration.  Imbalances in body fluids.  Low heart rate and blood pressure.  Thin bones that break easily.  Social isolation or relationship problems with your friends and family.  Emotional  distress, including depression and anxiety.  A greater risk of an eating disorder. If you develop an eating disorder, you could develop serious health problems and complications that affect yourorgans and bodily processes. These include:  Dry skin and hair.  Hair loss.  Fainting.  Difficulty getting pregnant.  Changes in your heart muscle and the way your heart works.  Severe dehydration.  Damage to the digestive tract and digestive problems.  Long-term (chronic) gastrointestinal problems.  High blood pressure.  High cholesterol.  Heart disease.  Type 2 diabetes. Where to find support For more support, talk with:  Your health care provider or dietitian. Ask about support groups.  A mental health care provider.  Family and friends. Where to find more information Learn more about how to prevent complications from unhealthy weight loss behaviors from:  Centers for Disease Control and Prevention: https://www.cervantes-rivera.net/  [place]: PureMess.co.nz.shtml  National Eating Disorders Association: www.nationaleatingdisorders.org Contact a health care provider if:  You often feel very tired.  You notice changes in your skin or your hair.  You faint because of dehydration or too much exercise.  You struggle to change your unhealthy weight loss behaviors on your own.  Unhealthy weight loss behaviors are affecting your daily life.  You have signs or symptoms of an eating disorder.  You have major weight changes in a short period of time.  You feel guilty or ashamed about eating or exercising.  You have trouble with your relationships because of your weight loss habits. Summary  Aim for slow, steady weight loss by choosing healthy foods, getting enough calories every day, and exercising regularly.  If you cannot make these changes by yourself, or  if you think that you may have an eating disorder, contact your health care provider. This information is not intended to replace advice given to you by your health care provider. Make sure you discuss any questions you have with your health care provider. Document Released: 04/06/2015 Document Revised: 03/04/2017 Document Reviewed: 04/06/2015 Elsevier Patient Education  2020 [employer].

## 2018-12-14 NOTE — Progress Notes (Signed)
New Patient Office Visit  Subjective:  Patient ID: Gabriel [name], male    DOB: 1980-09-11  Age: 38 y.o. MRN: [id number]  CC:  Chief Complaint  Patient presents with  . Hospitalization Follow-up    abdominal pain    HPI Jonathyn Bacus presents for abdominal pain and emergency room indicated if pain continued he needed a CT with IV contrast and /or contrast. No past medical history on file.  No past surgical history on file.  No family history on file.  Social History   Socioeconomic History  . Marital status: Single    Spouse name: Not on file  . Number of children: Not on file  . Years of education: Not on file  . Highest education level: Not on file  Occupational History  . Not on file  Social Needs  . Financial resource strain: Not on file  . Food insecurity    Worry: Not on file    Inability: Not on file  . Transportation needs    Medical: Not on file    Non-medical: Not on file  Tobacco Use  . Smoking status: Current Some Day Smoker    Types: E-cigarettes  . Smokeless tobacco: Never Used  Substance and Sexual Activity  . Alcohol use: No  . Drug use: No  . Sexual activity: Not on file  Lifestyle  . Physical activity    Days per week: Not on file    Minutes per session: Not on file  . Stress: Not on file  Relationships  . Social Herbalist on phone: Not on file    Gets together: Not on file    Attends religious service: Not on file    Active member of club or organization: Not on file    Attends meetings of clubs or organizations: Not on file    Relationship status: Not on file  . Intimate partner violence    Fear of current or ex partner: Not on file    Emotionally abused: Not on file    Physically abused: Not on file    Forced sexual activity: Not on file  Other Topics Concern  . Not on file  Social History Narrative  . Not on file    ROS Review of Systems  Constitutional: Positive for activity change, appetite change and  fatigue.  Gastrointestinal: Positive for abdominal pain, constipation, nausea and vomiting.    Objective:   Today's Vitals: BP 126/81 (BP Location: Left Arm, Patient Position: Lying right side, Cuff Size: Normal)   Pulse 71   Temp 98.2 F (36.8 C) (Tympanic)   Ht 5\' 8"  (1.727 m)   Wt 94 lb 12.8 oz (43 kg)   SpO2 98%   BMI 14.41 kg/m   Physical Exam Vitals signs reviewed.  Constitutional:      Appearance: He is ill-appearing.  HENT:     Head: Normocephalic.     Right Ear: Tympanic membrane normal.     Left Ear: Tympanic membrane normal.  Eyes:     Extraocular Movements: Extraocular movements intact.     Pupils: Pupils are equal, round, and reactive to light.  Neck:     Musculoskeletal: Normal range of motion.  Cardiovascular:     Rate and Rhythm: Normal rate and regular rhythm.     Pulses: Normal pulses.     Heart sounds: Normal heart sounds.  Pulmonary:     Effort: Pulmonary effort is normal.     Breath sounds: Normal breath  sounds.  Abdominal:     General: Abdomen is flat. Bowel sounds are normal.     Palpations: Abdomen is soft.  Musculoskeletal: Normal range of motion.  Neurological:     General: No focal deficit present.     Mental Status: He is alert.  Psychiatric:        Mood and Affect: Mood normal.     Assessment & Plan:  Alekzander was seen today for hospitalization follow-up.  Diagnoses and all orders for this visit:  Encounter to establish care -     Ambulatory referral to Gastroenterology -     Discontinue: Melatonin 5 MG TABS; Take 1 tablet (5 mg total) by mouth at bedtime. -     Discontinue: cyclobenzaprine (FLEXERIL) 10 MG tablet; Take 1 tablet (10 mg total) by mouth 3 (three) times daily as needed for muscle spasms. -     CT Abdomen Pelvis W Contrast; Future -     cyclobenzaprine (FLEXERIL) 10 MG tablet; Take 1 tablet (10 mg total) by mouth 3 (three) times daily as needed for muscle spasms. -     Melatonin 5 MG TABS; Take 1 tablet (5 mg total) by  mouth at bedtime.  Depression, unspecified depression type Depression is when you feel down, blue or sad for at least 2 weeks in a row. You may experience increaset increase in sleeping, eating or  loss of interest in doing things that once gave you pleasure. Feeling worthless, guilty, nervous and low self esteem, avoiding interaction with other people or increase agitation.  Loss of weight -     Ambulatory referral to Gastroenterology  Abdominal pain, unspecified abdominal location -     Ambulatory referral to Gastroenterology  Constipation, unspecified constipation type MANAGEMENT OF CHRONIC CONSTIPATION   Drink fluids in the recommended amount everyday. Recommend amount is 8 cups of water daily. Do not replace water with Gatorade or Powerade as these should only be used when you are dehydrated.   Eat lots of high fiber foods-fruits, veggies, bran and whole grain instead of white bread  Be active everyday. Inactivity makes constipation worse.  Add psyllium daily (Metamucil) which comes in capsules now. Start very low dose and work up to recommended dose on bottle daily.  Stay away from Victor or any magnesium containing laxative, unless you need it to clear things out rarely. It is an addictive laxative and your gut will become dependent on it.  If that is not working, I would start Miralax, which you can buy in generic 17 gms daily. It's a powder and not an "addictive laxative". Take it every day and titrate the dose up or down to get the daily Bm.  We will consider the use of other pharmacological treatments should the above recommendations prove to be unsuccessful.   -     Ambulatory referral to Gastroenterology  Nausea and vomiting, intractability of vomiting not specified, unspecified vomiting type -     Ambulatory referral to Gastroenterology  Generalized abdominal pain Rule out appendicitis /obstructive bowel obstruction.  -     Cancel: CT Abdomen Pelvis W  Contrast; Future -     CT Abdomen Pelvis W Contrast; Future    Follow-up: Return in about 5 weeks (around 01/18/2019) for weight loss.   Kerin Perna, NP

## 2018-12-15 ENCOUNTER — Ambulatory Visit (HOSPITAL_COMMUNITY)
Admission: RE | Admit: 2018-12-15 | Discharge: 2018-12-15 | Disposition: A | Discharge status: Home / Self Care | Payer: Self-pay | Source: Ambulatory Visit | Attending: Primary Care | Admitting: Primary Care

## 2018-12-15 DIAGNOSIS — R1084 Generalized abdominal pain: Secondary | ICD-10-CM | POA: Insufficient documentation

## 2018-12-15 MED ORDER — SODIUM CHLORIDE (PF) 0.9 % IJ SOLN
INTRAMUSCULAR | Status: AC
  Filled 2018-12-15: qty 50

## 2018-12-15 MED ORDER — IOHEXOL 300 MG/ML  SOLN
100.0000 mL | Freq: Once | INTRAMUSCULAR | Status: AC | PRN
  Administered 2018-12-15: 17:00:00 75 mL via INTRAVENOUS

## 2018-12-18 ENCOUNTER — Other Ambulatory Visit (INDEPENDENT_AMBULATORY_CARE_PROVIDER_SITE_OTHER): Payer: Self-pay | Admitting: Primary Care

## 2018-12-18 DIAGNOSIS — R109 Unspecified abdominal pain: Secondary | ICD-10-CM

## 2018-12-20 ENCOUNTER — Encounter (INDEPENDENT_AMBULATORY_CARE_PROVIDER_SITE_OTHER): Payer: Self-pay | Admitting: Primary Care

## 2018-12-21 ENCOUNTER — Ambulatory Visit (INDEPENDENT_AMBULATORY_CARE_PROVIDER_SITE_OTHER): Payer: Self-pay | Admitting: Gastroenterology

## 2018-12-21 ENCOUNTER — Other Ambulatory Visit: Payer: Self-pay

## 2018-12-21 ENCOUNTER — Encounter: Payer: Self-pay | Admitting: Gastroenterology

## 2018-12-21 ENCOUNTER — Other Ambulatory Visit (INDEPENDENT_AMBULATORY_CARE_PROVIDER_SITE_OTHER): Payer: Self-pay

## 2018-12-21 VITALS — BP 110/76 | HR 100 | Temp 98.7°F | Ht 68.0 in | Wt 92.2 lb

## 2018-12-21 DIAGNOSIS — R634 Abnormal weight loss: Secondary | ICD-10-CM

## 2018-12-21 DIAGNOSIS — K8689 Other specified diseases of pancreas: Secondary | ICD-10-CM

## 2018-12-21 DIAGNOSIS — K5900 Constipation, unspecified: Secondary | ICD-10-CM

## 2018-12-21 DIAGNOSIS — R109 Unspecified abdominal pain: Secondary | ICD-10-CM

## 2018-12-21 LAB — CBC WITH DIFFERENTIAL/PLATELET
Basophils Absolute: 0.1 10*3/uL (ref 0.0–0.1)
Basophils Relative: 1.2 % (ref 0.0–3.0)
Eosinophils Absolute: 0.1 10*3/uL (ref 0.0–0.7)
Eosinophils Relative: 1.2 % (ref 0.0–5.0)
HCT: 49.2 % (ref 39.0–52.0)
Hemoglobin: 16.3 g/dL (ref 13.0–17.0)
Lymphocytes Relative: 27.4 % (ref 12.0–46.0)
Lymphs Abs: 1.4 10*3/uL (ref 0.7–4.0)
MCHC: 33 g/dL (ref 30.0–36.0)
MCV: 88.3 fl (ref 78.0–100.0)
Monocytes Absolute: 0.3 10*3/uL (ref 0.1–1.0)
Monocytes Relative: 4.9 % (ref 3.0–12.0)
Neutro Abs: 3.4 10*3/uL (ref 1.4–7.7)
Neutrophils Relative %: 65.3 % (ref 43.0–77.0)
Platelets: 270 10*3/uL (ref 150.0–400.0)
RBC: 5.58 Mil/uL (ref 4.22–5.81)
RDW: 14.2 % (ref 11.5–15.5)
WBC: 5.2 10*3/uL (ref 4.0–10.5)

## 2018-12-21 LAB — COMPREHENSIVE METABOLIC PANEL
ALT: 174 U/L — ABNORMAL HIGH (ref 0–53)
AST: 48 U/L — ABNORMAL HIGH (ref 0–37)
Albumin: 4.5 g/dL (ref 3.5–5.2)
Alkaline Phosphatase: 75 U/L (ref 39–117)
BUN: 9 mg/dL (ref 6–23)
CO2: 33 mEq/L — ABNORMAL HIGH (ref 19–32)
Calcium: 10 mg/dL (ref 8.4–10.5)
Chloride: 97 mEq/L (ref 96–112)
Creatinine, Ser: 0.96 mg/dL (ref 0.40–1.50)
GFR: 105.63 mL/min (ref >60.00)
Glucose, Bld: 105 mg/dL — ABNORMAL HIGH (ref 70–99)
Potassium: 3.9 mEq/L (ref 3.5–5.1)
Sodium: 136 mEq/L (ref 135–145)
Total Bilirubin: 0.4 mg/dL (ref 0.2–1.2)
Total Protein: 7.6 g/dL (ref 6.0–8.3)

## 2018-12-21 LAB — LIPASE: Lipase: 118 U/L — ABNORMAL HIGH (ref 11.0–59.0)

## 2018-12-21 MED ORDER — OXYCODONE-ACETAMINOPHEN 5-325 MG PO TABS: 1.0000 | ORAL_TABLET | ORAL | 0 refills | Status: DC | PRN

## 2018-12-21 NOTE — H&P (View-Only) (Signed)
HPI :  38 y/o male with no significant past medical history, referral by Juluis Mire NP  for abdominal pain and abnormal CT imaging.   The patient is can accompanied by his fiance.  He reports for the past 3 to 4 months he has had progressive weight loss.  He previously weighed 140 pounds at baseline, now down to 92 pounds.  During this time he has developed persistent abdominal pain in his right mid to upper abdomen.  This pain radiates into his back.  It tends to be there all the time.  He has had a very poor appetite with anorexia.  He denies much of any nausea but has no desire to eat.  He has been using some Ensure lately to help get some calories in.  He denies any vomiting recently but had this previously.  He was given some Zofran to help with his symptoms previously but he could not afford it.  He was given a trial of omeprazole over-the-counter which did not help at all when symptoms first started.  He reports having some constipation lately, often has to strain to get out stool.  No blood in his stool.  He is not using any NSAIDs.  He has been using Tylenol PM to help him sleep at night.  The pain is become progressively worse and having a hard time tolerating it.  He has been to the ER on 3 different occasions for this evaluation so far.  He was seen [place] on 11/29/2018 and had a noncontrast CT scan of the abdomen and pelvis.  He reports his IV blew and was not able to get IV contrast.  On that exam there was no acute pathology.  He states he then went to an emergency room in [place] for persistent symptoms and had another CT scan done there which did not show any clear cause, he does not know if that had IV contrast or not.  He was given some Flexeril at some point for pain which has not helped.  Most recently he was seen again at the [facility] ER for persistent symptoms and progressive weight loss.  They performed a CT scan of the abdomen and pelvis with contrast on September 11.  On that  exam he was found to have a 1.5 cm mass in the mid pancreas with atrophy and upstream pancreatic tail dilation with some soft tissue fullness around the superior mesenteric artery and celiac axis.  He was also noted to have some parenchymal abnormalities in the lungs, suspected infectious versus inflammatory process and a follow-up CT chest was recommended in 3 months.  He is previously had a normal CBC without any anemia or leukocytosis.  Prior lipase on 8/26 was 65.  He had normal liver enzymes in May but have not been rechecked.    He is extremely weak and continuing to lose weight.  He has not been able to work in recent weeks.  He is normally works as a Games developer. He is unaware of the results of his most recent CT Scan and the concerns about what was found.  He denies any family history of pancreatic cancer.  He denies any family history of pancreatitis.  He has a history of tobacco use, has occasional alcohol use.  CT abdomen / pelvis without contrast 11/29/18 - no acute pathology  CT abdomen / pelvis with contrast 12/15/18 - 1.5cm mass within the mid aspect of the pancreatic body with associated atrophy of the upstream  pancreatic tail and associated pancreatic ductal dilatation. There is soft tissue fullness around the superior mesenteric artery and celiac axis. This may represent edema or potentially tumor involvement. 2. Tree-in-bud nodularity within the peripheral aspect of the lower obes bilaterally which may be secondary to an infectious or inflammatory process. Recommend follow-up chest CT in 3 months to assess for interval change.    History reviewed. No pertinent past medical history.   Past Surgical History:  Procedure Laterality Date  . none     Family History  Problem Relation Age of Onset  . Breast cancer Mother   . Esophageal cancer Neg Hx   . Colon cancer Neg Hx   . Pancreatic cancer Neg Hx   . Liver disease Neg Hx   . Stomach cancer Neg Hx    Social History    Tobacco Use  . Smoking status: Current Every Day Smoker    Types: E-cigarettes  . Smokeless tobacco: Never Used  Substance Use Topics  . Alcohol use: No  . Drug use: No   Current Outpatient Medications  Medication Sig Dispense Refill  . cyclobenzaprine (FLEXERIL) 10 MG tablet Take 10 mg by mouth 3 (three) times daily as needed for muscle spasms.     No current facility-administered medications for this visit.    Allergies  Allergen Reactions  . Penicillins Swelling    Has patient had a PCN reaction causing immediate rash, facial/tongue/throat swelling, SOB or lightheadedness with hypotension: Yes-swelling Has patient had a PCN reaction causing severe rash involving mucus membranes or skin necrosis: No Has patient had a PCN reaction that required hospitalization: No Has patient had a PCN reaction occurring within the last 10 years:Yes If all of the above answers are "NO", then may proceed with Cephalosporin use.      Review of Systems: All systems reviewed and negative except where noted in HPI.    Ct Abdomen Pelvis Wo Contrast  Result Date: 11/29/2018 CLINICAL DATA:  Abdominal pain, evaluate for appendicitis EXAM: CT ABDOMEN AND PELVIS WITHOUT CONTRAST TECHNIQUE: Multidetector CT imaging of the abdomen and pelvis was performed following the standard protocol without IV contrast. COMPARISON:  10/25/2018 FINDINGS: Lower chest: No acute abnormality. Hepatobiliary: No focal liver abnormality is seen. No gallstones, gallbladder wall thickening, or biliary dilatation. Pancreas: Unremarkable. No pancreatic ductal dilatation or surrounding inflammatory changes. Spleen: Normal in size without focal abnormality. Adrenals/Urinary Tract: Adrenal glands are unremarkable. Kidneys are normal, without renal calculi, focal lesion, or hydronephrosis. Bladder is unremarkable. Stomach/Bowel: Stomach is within normal limits. Appendix is not visualized and is limited in overall evaluation secondary to  lack of enteric contrast and peroneal fat. No evidence of bowel wall thickening, distention, or inflammatory changes. Vascular/Lymphatic: Normal caliber abdominal aorta with mild atherosclerosis. No lymphadenopathy. Reproductive: Prostate is unremarkable. Other: No abdominal wall hernia or abnormality. No abdominopelvic ascites. Musculoskeletal: No acute or significant osseous findings. IMPRESSION: 1. Appendix is not visualized and is limited in overall evaluation secondary to lack of enteric contrast and peroneal fat. If there is persistent concern regarding acute appendicitis, recommend repeat CT of the abdomen with oral and intravenous contrast. 2. Otherwise, no acute abdominal or pelvic pathology. 3.  Aortic Atherosclerosis (ICD10-I70.0). Electronically Signed   By: Kathreen Devoid   On: 11/29/2018 15:25   Ct Abdomen Pelvis W Contrast  Result Date: 12/15/2018 CLINICAL DATA:  Patient with right lower quadrant abdominal pain for 1 year. Extensive weight loss. EXAM: CT ABDOMEN AND PELVIS WITH CONTRAST TECHNIQUE: Multidetector CT imaging of the  abdomen and pelvis was performed using the standard protocol following bolus administration of intravenous contrast. CONTRAST:  76mL OMNIPAQUE IOHEXOL 300 MG/ML  SOLN COMPARISON:  CT abdomen pelvis 11/29/2018; CT abdomen pelvis 10/25/2018 FINDINGS: Lower chest: Normal heart size. Tree-in-bud nodularity within the peripheral aspect of the lower lobes bilaterally. No pleural effusion. Hepatobiliary: Liver is normal in size and contour. Subcentimeter too small to characterize low-attenuation lesion hepatic dome. Gallbladder is unremarkable. No intrahepatic or extrahepatic biliary ductal dilatation. Pancreas: Within the mid aspect of the pancreatic body (image 21; series 2) there is suggestion of a small mass measuring approximately 1.5 cm. There is soft tissue fullness around the celiac axis (image 18; series 2) and superior mesenteric artery. There is atrophy of the upstream  pancreatic tail with associated pancreatic ductal dilatation measuring up to 4 mm. Spleen: Unremarkable Adrenals/Urinary Tract: Normal adrenal glands. Kidneys enhance symmetrically with contrast. Urinary bladder is unremarkable. Stomach/Bowel: Oral contrast material throughout the small and large bowel. No evidence for bowel obstruction. No free fluid or free intraperitoneal air. Vascular/Lymphatic: Normal caliber abdominal aorta. Peripheral calcified atherosclerotic plaque. No retroperitoneal lymphadenopathy. The splenic vein is not patent. Reproductive: Unremarkable. Other: None. Musculoskeletal: No aggressive or acute appearing osseous lesions. IMPRESSION: 1. Suggestion of a 1.5 cm mass within the mid aspect of the pancreatic body with associated atrophy of the upstream pancreatic tail and associated pancreatic ductal dilatation. Recommend further evaluation with MRI. There is soft tissue fullness around the superior mesenteric artery and celiac axis. This may represent edema or potentially tumor involvement. 2. Tree-in-bud nodularity within the peripheral aspect of the lower lobes bilaterally which may be secondary to an infectious or inflammatory process. Recommend follow-up chest CT in 3 months to assess for interval change. 3. These results will be called to the ordering clinician or representative by the Radiologist Assistant, and communication documented in the PACS or zVision Dashboard. Electronically Signed   By: Lovey Newcomer M.D.   On: 12/15/2018 18:43    Physical Exam: BP 110/76   Pulse 100   Temp 98.7 F (37.1 C)   Ht 5\' 8"  (1.727 m)   Wt 92 lb 3.2 oz (41.8 kg)   BMI 14.02 kg/m  Constitutional: Pleasant, thin male  HEENT: Normocephalic and atraumatic. Conjunctivae are normal. No scleral icterus. Neck supple.  Cardiovascular: Normal rate, regular rhythm.  Pulmonary/chest: Effort normal and breath sounds normal.  Abdominal: Soft, nondistended, scaphoid abdomen with right upper to mid  abdominal TTP. Marland Kitchen There are no masses palpable. No hepatomegaly. Extremities: no edema Lymphadenopathy: No cervical adenopathy noted. Neurological: Alert and oriented to person place and time. Skin: Skin is warm and dry. No rashes noted. Psychiatric: Normal mood and affect. Behavior is normal.   ASSESSMENT AND PLAN: 38 year old male here for new patient consultation regarding the following:  Pancreatic mass  Weight loss Abdominal pain Constipation  Most recent CT scan is very concerning for pancreatic mass lesion / malignancy, and I suspect this is driving his symptoms.  We discussed the CT scan results, he and his fiance were not aware of the details of this.  I relayed my concerns and differential diagnosis.  He is progressively deteriorating and failing to thrive, he needs an urgent work-up for these symptoms and findings.  I am going to repeat his labs today to ensure he is not dehydrated or warrants admission to the hospital for that, also get a check CA-19-9 level.  Ultimately he warrants an EGD and EUS to further evaluate this and potentially biopsy  to rule out pancreatic malignancy.  I am going to speak with my advanced endoscopy colleagues to see about getting this done as soon as possible.  If he continues to deteriorate as an outpatient and cannot tolerate p.o. or have adequate pain control with oral medications, he may need to be admitted for expedited evaluation and pain control.  I am going to give him some Percocet to use for pain control initially.  I recommend he stay on a liquid diet as he does better with that rather than solids right now, and use protein shakes/Ensure to boost his caloric intake.  He declined a trial of Zofran, he can't afford it.  He declined a trial of PPI as he is already tried that and with no help.  I recommend he use MiraLAX for constipation especially with the use of narcotic his constipation will worsen if he does not take anything.  We will hope to get  back to him soon about scheduling an EUS.  If he has any questions or is worsening in the interim he is advised to call us back to discuss admission to the hospital.  He and fianc agreed.  All questions answered.  Point of contacts: Patient cell - [phone] or fiancee [phone]  Jansen Cellar, MD [facility]  CC: Kerin Perna, NP

## 2018-12-21 NOTE — Progress Notes (Addendum)
HPI :  38 y/o male with no significant past medical history, referral by Juluis Mire NP  for abdominal pain and abnormal CT imaging.   The patient is can accompanied by his fiance.  He reports for the past 3 to 4 months he has had progressive weight loss.  He previously weighed 140 pounds at baseline, now down to 92 pounds.  During this time he has developed persistent abdominal pain in his right mid to upper abdomen.  This pain radiates into his back.  It tends to be there all the time.  He has had a very poor appetite with anorexia.  He denies much of any nausea but has no desire to eat.  He has been using some Ensure lately to help get some calories in.  He denies any vomiting recently but had this previously.  He was given some Zofran to help with his symptoms previously but he could not afford it.  He was given a trial of omeprazole over-the-counter which did not help at all when symptoms first started.  He reports having some constipation lately, often has to strain to get out stool.  No blood in his stool.  He is not using any NSAIDs.  He has been using Tylenol PM to help him sleep at night.  The pain is become progressively worse and having a hard time tolerating it.  He has been to the ER on 3 different occasions for this evaluation so far.  He was seen [facility] on 11/29/2018 and had a noncontrast CT scan of the abdomen and pelvis.  He reports his IV blew and was not able to get IV contrast.  On that exam there was no acute pathology.  He states he then went to an emergency room in [place] for persistent symptoms and had another CT scan done there which did not show any clear cause, he does not know if that had IV contrast or not.  He was given some Flexeril at some point for pain which has not helped.  Most recently he was seen again at the [facility] ER for persistent symptoms and progressive weight loss.  They performed a CT scan of the abdomen and pelvis with contrast on September 11.  On that  exam he was found to have a 1.5 cm mass in the mid pancreas with atrophy and upstream pancreatic tail dilation with some soft tissue fullness around the superior mesenteric artery and celiac axis.  He was also noted to have some parenchymal abnormalities in the lungs, suspected infectious versus inflammatory process and a follow-up CT chest was recommended in 3 months.  He is previously had a normal CBC without any anemia or leukocytosis.  Prior lipase on 8/26 was 65.  He had normal liver enzymes in May but have not been rechecked.    He is extremely weak and continuing to lose weight.  He has not been able to work in recent weeks.  He is normally works as a Games developer. He is unaware of the results of his most recent CT Scan and the concerns about what was found.  He denies any family history of pancreatic cancer.  He denies any family history of pancreatitis.  He has a history of tobacco use, has occasional alcohol use.  CT abdomen / pelvis without contrast 11/29/18 - no acute pathology  CT abdomen / pelvis with contrast 12/15/18 - 1.5cm mass within the mid aspect of the pancreatic body with associated atrophy of the upstream  pancreatic tail and associated pancreatic ductal dilatation. There is soft tissue fullness around the superior mesenteric artery and celiac axis. This may represent edema or potentially tumor involvement. 2. Tree-in-bud nodularity within the peripheral aspect of the lower obes bilaterally which may be secondary to an infectious or inflammatory process. Recommend follow-up chest CT in 3 months to assess for interval change.    History reviewed. No pertinent past medical history.   Past Surgical History:  Procedure Laterality Date  . none     Family History  Problem Relation Age of Onset  . Breast cancer Mother   . Esophageal cancer Neg Hx   . Colon cancer Neg Hx   . Pancreatic cancer Neg Hx   . Liver disease Neg Hx   . Stomach cancer Neg Hx    Social History    Tobacco Use  . Smoking status: Current Every Day Smoker    Types: E-cigarettes  . Smokeless tobacco: Never Used  Substance Use Topics  . Alcohol use: No  . Drug use: No   Current Outpatient Medications  Medication Sig Dispense Refill  . cyclobenzaprine (FLEXERIL) 10 MG tablet Take 10 mg by mouth 3 (three) times daily as needed for muscle spasms.     No current facility-administered medications for this visit.    Allergies  Allergen Reactions  . Penicillins Swelling    Has patient had a PCN reaction causing immediate rash, facial/tongue/throat swelling, SOB or lightheadedness with hypotension: Yes-swelling Has patient had a PCN reaction causing severe rash involving mucus membranes or skin necrosis: No Has patient had a PCN reaction that required hospitalization: No Has patient had a PCN reaction occurring within the last 10 years:Yes If all of the above answers are "NO", then may proceed with Cephalosporin use.      Review of Systems: All systems reviewed and negative except where noted in HPI.    Ct Abdomen Pelvis Wo Contrast  Result Date: 11/29/2018 CLINICAL DATA:  Abdominal pain, evaluate for appendicitis EXAM: CT ABDOMEN AND PELVIS WITHOUT CONTRAST TECHNIQUE: Multidetector CT imaging of the abdomen and pelvis was performed following the standard protocol without IV contrast. COMPARISON:  10/25/2018 FINDINGS: Lower chest: No acute abnormality. Hepatobiliary: No focal liver abnormality is seen. No gallstones, gallbladder wall thickening, or biliary dilatation. Pancreas: Unremarkable. No pancreatic ductal dilatation or surrounding inflammatory changes. Spleen: Normal in size without focal abnormality. Adrenals/Urinary Tract: Adrenal glands are unremarkable. Kidneys are normal, without renal calculi, focal lesion, or hydronephrosis. Bladder is unremarkable. Stomach/Bowel: Stomach is within normal limits. Appendix is not visualized and is limited in overall evaluation secondary to  lack of enteric contrast and peroneal fat. No evidence of bowel wall thickening, distention, or inflammatory changes. Vascular/Lymphatic: Normal caliber abdominal aorta with mild atherosclerosis. No lymphadenopathy. Reproductive: Prostate is unremarkable. Other: No abdominal wall hernia or abnormality. No abdominopelvic ascites. Musculoskeletal: No acute or significant osseous findings. IMPRESSION: 1. Appendix is not visualized and is limited in overall evaluation secondary to lack of enteric contrast and peroneal fat. If there is persistent concern regarding acute appendicitis, recommend repeat CT of the abdomen with oral and intravenous contrast. 2. Otherwise, no acute abdominal or pelvic pathology. 3.  Aortic Atherosclerosis (ICD10-I70.0). Electronically Signed   By: Kathreen Devoid   On: 11/29/2018 15:25   Ct Abdomen Pelvis W Contrast  Result Date: 12/15/2018 CLINICAL DATA:  Patient with right lower quadrant abdominal pain for 1 year. Extensive weight loss. EXAM: CT ABDOMEN AND PELVIS WITH CONTRAST TECHNIQUE: Multidetector CT imaging of the  abdomen and pelvis was performed using the standard protocol following bolus administration of intravenous contrast. CONTRAST:  37mL OMNIPAQUE IOHEXOL 300 MG/ML  SOLN COMPARISON:  CT abdomen pelvis 11/29/2018; CT abdomen pelvis 10/25/2018 FINDINGS: Lower chest: Normal heart size. Tree-in-bud nodularity within the peripheral aspect of the lower lobes bilaterally. No pleural effusion. Hepatobiliary: Liver is normal in size and contour. Subcentimeter too small to characterize low-attenuation lesion hepatic dome. Gallbladder is unremarkable. No intrahepatic or extrahepatic biliary ductal dilatation. Pancreas: Within the mid aspect of the pancreatic body (image 21; series 2) there is suggestion of a small mass measuring approximately 1.5 cm. There is soft tissue fullness around the celiac axis (image 18; series 2) and superior mesenteric artery. There is atrophy of the upstream  pancreatic tail with associated pancreatic ductal dilatation measuring up to 4 mm. Spleen: Unremarkable Adrenals/Urinary Tract: Normal adrenal glands. Kidneys enhance symmetrically with contrast. Urinary bladder is unremarkable. Stomach/Bowel: Oral contrast material throughout the small and large bowel. No evidence for bowel obstruction. No free fluid or free intraperitoneal air. Vascular/Lymphatic: Normal caliber abdominal aorta. Peripheral calcified atherosclerotic plaque. No retroperitoneal lymphadenopathy. The splenic vein is not patent. Reproductive: Unremarkable. Other: None. Musculoskeletal: No aggressive or acute appearing osseous lesions. IMPRESSION: 1. Suggestion of a 1.5 cm mass within the mid aspect of the pancreatic body with associated atrophy of the upstream pancreatic tail and associated pancreatic ductal dilatation. Recommend further evaluation with MRI. There is soft tissue fullness around the superior mesenteric artery and celiac axis. This may represent edema or potentially tumor involvement. 2. Tree-in-bud nodularity within the peripheral aspect of the lower lobes bilaterally which may be secondary to an infectious or inflammatory process. Recommend follow-up chest CT in 3 months to assess for interval change. 3. These results will be called to the ordering clinician or representative by the Radiologist Assistant, and communication documented in the PACS or zVision Dashboard. Electronically Signed   By: Lovey Newcomer M.D.   On: 12/15/2018 18:43    Physical Exam: BP 110/76   Pulse 100   Temp 98.7 F (37.1 C)   Ht 5\' 8"  (1.727 m)   Wt 92 lb 3.2 oz (41.8 kg)   BMI 14.02 kg/m  Constitutional: Pleasant, thin male  HEENT: Normocephalic and atraumatic. Conjunctivae are normal. No scleral icterus. Neck supple.  Cardiovascular: Normal rate, regular rhythm.  Pulmonary/chest: Effort normal and breath sounds normal.  Abdominal: Soft, nondistended, scaphoid abdomen with right upper to mid  abdominal TTP. Marland Kitchen There are no masses palpable. No hepatomegaly. Extremities: no edema Lymphadenopathy: No cervical adenopathy noted. Neurological: Alert and oriented to person place and time. Skin: Skin is warm and dry. No rashes noted. Psychiatric: Normal mood and affect. Behavior is normal.   ASSESSMENT AND PLAN: 38 year old male here for new patient consultation regarding the following:  Pancreatic mass  Weight loss Abdominal pain Constipation  Most recent CT scan is very concerning for pancreatic mass lesion / malignancy, and I suspect this is driving his symptoms.  We discussed the CT scan results, he and his fiance were not aware of the details of this.  I relayed my concerns and differential diagnosis.  He is progressively deteriorating and failing to thrive, he needs an urgent work-up for these symptoms and findings.  I am going to repeat his labs today to ensure he is not dehydrated or warrants admission to the hospital for that, also get a check CA-19-9 level.  Ultimately he warrants an EGD and EUS to further evaluate this and potentially biopsy  to rule out pancreatic malignancy.  I am going to speak with my advanced endoscopy colleagues to see about getting this done as soon as possible.  If he continues to deteriorate as an outpatient and cannot tolerate p.o. or have adequate pain control with oral medications, he may need to be admitted for expedited evaluation and pain control.  I am going to give him some Percocet to use for pain control initially.  I recommend he stay on a liquid diet as he does better with that rather than solids right now, and use protein shakes/Ensure to boost his caloric intake.  He declined a trial of Zofran, he can't afford it.  He declined a trial of PPI as he is already tried that and with no help.  I recommend he use MiraLAX for constipation especially with the use of narcotic his constipation will worsen if he does not take anything.  We will hope to get  back to him soon about scheduling an EUS.  If he has any questions or is worsening in the interim he is advised to call us back to discuss admission to the hospital.  He and fianc agreed.  All questions answered.  Point of contacts: Patient cell - [phone] or fiancee [phone]  Haviland Cellar, MD [facility]  CC: Kerin Perna, NP

## 2018-12-21 NOTE — Patient Instructions (Addendum)
If you are age 39 or older, your body mass index should be between 23-30. Your Body mass index is 14.02 kg/m. If this is out of the aforementioned range listed, please consider follow up with your Primary Care Provider.  If you are age 49 or younger, your body mass index should be between 19-25. Your Body mass index is 14.02 kg/m. If this is out of the aformentioned range listed, please consider follow up with your Primary Care Provider.   To help prevent the possible spread of infection to our patients, communities, and staff; we will be implementing the following measures:  As of now we are not allowing any visitors/family members to accompany you to any upcoming appointments with [facility]. If you have any concerns about this please contact our office to discuss prior to the appointment.   Please go to the lab in the basement of our building to have lab work done as you leave today. Hit "B" for basement when you get on the elevator.  When the doors open the lab is on your left.  We will call you with the results. Thank you.  Take Miralax over-the-counter as needed for constipation.  We have sent the following medications to your pharmacy for you to pick up at your convenience: Percocet 5-325mg : Take every 4 to 6 hours as needed for pain  Thank you for entrusting me with your care and for choosing [facility], Dr. [place] Cellar

## 2018-12-22 ENCOUNTER — Other Ambulatory Visit: Payer: Self-pay

## 2018-12-22 ENCOUNTER — Telehealth: Payer: Self-pay

## 2018-12-22 DIAGNOSIS — K8689 Other specified diseases of pancreas: Secondary | ICD-10-CM

## 2018-12-22 DIAGNOSIS — R634 Abnormal weight loss: Secondary | ICD-10-CM

## 2018-12-22 DIAGNOSIS — R627 Adult failure to thrive: Secondary | ICD-10-CM

## 2018-12-22 LAB — CANCER ANTIGEN 19-9: CA 19-9: 120 U/mL — ABNORMAL HIGH (ref <34)

## 2018-12-22 NOTE — Telephone Encounter (Signed)
EUS scheduled for 9/24 at [facility] with Dr Lenna Sciara, pt instructed and medications reviewed.  Patient instructions mailed to home.  Patient to call with any questions or concerns. The pt has also been given the COVID testing.

## 2018-12-22 NOTE — Telephone Encounter (Signed)
-----   Message from Yetta Flock, MD sent at 12/22/2018  7:25 AM EDT ----- Regarding: RE: EUS request Thanks very much all, really appreciate you accommodating him.   Richardson Landry ----- Message ----- From: Milus Banister, MD Sent: 12/22/2018   6:32 AM EDT To: Timothy Lasso, RN, Yetta Flock, MD, # Subject: RE: EUS request                                I have one spot still open for next Thursday.  Certainly can add him on then as well.  Jeanet Lupe, Can you see what will work to expedite this procedure?  Thanks  DJ  ----- Message ----- From: Irving Copas., MD Sent: 12/21/2018   7:59 PM EDT To: Milus Banister, MD, Yetta Flock, MD Subject: RE: EUS request                                Rovonda and Glenys Snader to see if I can add on Monday if they haven't released my time slots and we can get the rapid test.  If not they are going to see about Tuesday.  If that doesn't work they will be looking at both Dan and Is schedule for the next couple of weeks to see what can happen. GM ----- Message ----- From: Yetta Flock, MD Sent: 12/21/2018   5:16 PM EDT To: Milus Banister, MD, # Subject: EUS request                                    Hey guys,  Need your assistance with an EUS on this guy. He's 38 y/o with progressive weight loss of almost 50 lbs in recent months. Long story short he's had a recent CT showing mid pancreatic body mass with some associated changes concerning for malignancy. He's failing to thrive, he may get admitted if he progresses any further and can't tolerate much PO or for pain control, but hoping he can get an expedited EGD / EUS quickly to evaluate and get him a diagnosis. Any chance either of you have any openings in the next week or so?  Thanks much, Richardson Landry

## 2018-12-25 ENCOUNTER — Other Ambulatory Visit (HOSPITAL_COMMUNITY)
Admission: RE | Admit: 2018-12-25 | Discharge: 2018-12-25 | Disposition: A | Discharge status: Home / Self Care | Payer: HRSA Program | Source: Ambulatory Visit | Attending: Gastroenterology | Admitting: Gastroenterology

## 2018-12-25 DIAGNOSIS — Z20828 Contact with and (suspected) exposure to other viral communicable diseases: Secondary | ICD-10-CM | POA: Diagnosis not present

## 2018-12-25 DIAGNOSIS — Z01812 Encounter for preprocedural laboratory examination: Secondary | ICD-10-CM | POA: Diagnosis not present

## 2018-12-26 ENCOUNTER — Other Ambulatory Visit: Payer: Self-pay

## 2018-12-26 ENCOUNTER — Encounter (HOSPITAL_COMMUNITY): Payer: Self-pay | Admitting: *Deleted

## 2018-12-26 LAB — NOVEL CORONAVIRUS, NAA (HOSP ORDER, SEND-OUT TO REF LAB; TAT 18-24 HRS): SARS-CoV-2, NAA: NOT DETECTED

## 2018-12-28 ENCOUNTER — Other Ambulatory Visit: Payer: Self-pay

## 2018-12-28 ENCOUNTER — Ambulatory Visit (HOSPITAL_COMMUNITY)
Admission: RE | Admit: 2018-12-28 | Discharge: 2018-12-28 | Disposition: A | Discharge status: Home / Self Care | Payer: Self-pay | Attending: Gastroenterology | Admitting: Gastroenterology

## 2018-12-28 ENCOUNTER — Ambulatory Visit (HOSPITAL_COMMUNITY): Payer: Self-pay | Admitting: Anesthesiology

## 2018-12-28 ENCOUNTER — Encounter (HOSPITAL_COMMUNITY): Payer: Self-pay

## 2018-12-28 ENCOUNTER — Encounter (HOSPITAL_COMMUNITY)
Admission: RE | Disposition: A | Discharge status: Home / Self Care | Payer: Self-pay | Source: Home / Self Care | Attending: Gastroenterology

## 2018-12-28 DIAGNOSIS — C251 Malignant neoplasm of body of pancreas: Secondary | ICD-10-CM | POA: Insufficient documentation

## 2018-12-28 DIAGNOSIS — C259 Malignant neoplasm of pancreas, unspecified: Secondary | ICD-10-CM

## 2018-12-28 DIAGNOSIS — K8689 Other specified diseases of pancreas: Secondary | ICD-10-CM

## 2018-12-28 DIAGNOSIS — R627 Adult failure to thrive: Secondary | ICD-10-CM

## 2018-12-28 DIAGNOSIS — F17290 Nicotine dependence, other tobacco product, uncomplicated: Secondary | ICD-10-CM | POA: Insufficient documentation

## 2018-12-28 DIAGNOSIS — C250 Malignant neoplasm of head of pancreas: Secondary | ICD-10-CM

## 2018-12-28 DIAGNOSIS — K5900 Constipation, unspecified: Secondary | ICD-10-CM | POA: Insufficient documentation

## 2018-12-28 DIAGNOSIS — R634 Abnormal weight loss: Secondary | ICD-10-CM

## 2018-12-28 DIAGNOSIS — Z880 Allergy status to penicillin: Secondary | ICD-10-CM | POA: Insufficient documentation

## 2018-12-28 HISTORY — DX: Other specified diseases of pancreas: K86.89

## 2018-12-28 HISTORY — PX: EUS: SHX5427

## 2018-12-28 HISTORY — PX: ESOPHAGOGASTRODUODENOSCOPY (EGD) WITH PROPOFOL: SHX5813

## 2018-12-28 HISTORY — PX: FINE NEEDLE ASPIRATION BIOPSY: CATH118315

## 2018-12-28 SURGERY — UPPER ENDOSCOPIC ULTRASOUND (EUS) RADIAL
Anesthesia: Monitor Anesthesia Care

## 2018-12-28 MED ORDER — LABETALOL HCL 5 MG/ML IV SOLN
INTRAVENOUS | Status: AC
  Filled 2018-12-28: qty 4

## 2018-12-28 MED ORDER — FENTANYL CITRATE (PF) 100 MCG/2ML IJ SOLN
INTRAMUSCULAR | Status: DC | PRN
  Administered 2018-12-28: 50 ug via INTRAVENOUS

## 2018-12-28 MED ORDER — METOCLOPRAMIDE HCL 5 MG/ML IJ SOLN
INTRAMUSCULAR | Status: DC | PRN
  Administered 2018-12-28: 10 mg via INTRAVENOUS

## 2018-12-28 MED ORDER — LABETALOL HCL 5 MG/ML IV SOLN
10.0000 mg | Freq: Once | INTRAVENOUS | Status: AC
  Administered 2018-12-28: 10 mg via INTRAVENOUS

## 2018-12-28 MED ORDER — ONDANSETRON HCL 4 MG/2ML IJ SOLN
INTRAMUSCULAR | Status: DC | PRN
  Administered 2018-12-28: 4 mg via INTRAVENOUS

## 2018-12-28 MED ORDER — LIDOCAINE 2% (20 MG/ML) 5 ML SYRINGE
INTRAMUSCULAR | Status: DC | PRN
  Administered 2018-12-28: 40 mg via INTRAVENOUS

## 2018-12-28 MED ORDER — PROPOFOL 10 MG/ML IV BOLUS
INTRAVENOUS | Status: AC
  Filled 2018-12-28: qty 20

## 2018-12-28 MED ORDER — LACTATED RINGERS IV SOLN
INTRAVENOUS | Status: DC | PRN
  Administered 2018-12-28: 13:00:00 via INTRAVENOUS

## 2018-12-28 MED ORDER — SODIUM CHLORIDE 0.9 % IV SOLN: INTRAVENOUS | Status: DC

## 2018-12-28 MED ORDER — PROPOFOL 10 MG/ML IV BOLUS
INTRAVENOUS | Status: DC | PRN
  Administered 2018-12-28 (×2): 20 mg via INTRAVENOUS
  Administered 2018-12-28: 30 mg via INTRAVENOUS
  Administered 2018-12-28 (×4): 20 mg via INTRAVENOUS

## 2018-12-28 MED ORDER — PROPOFOL 500 MG/50ML IV EMUL
INTRAVENOUS | Status: DC | PRN
  Administered 2018-12-28: 125 ug/kg/min via INTRAVENOUS

## 2018-12-28 MED ORDER — LABETALOL HCL 5 MG/ML IV SOLN
10.0000 mg | INTRAVENOUS | Status: DC | PRN
  Administered 2018-12-28 (×2): 10 mg via INTRAVENOUS

## 2018-12-28 MED ORDER — PROPOFOL 10 MG/ML IV BOLUS
INTRAVENOUS | Status: AC
  Filled 2018-12-28: qty 40

## 2018-12-28 MED ORDER — FENTANYL CITRATE (PF) 100 MCG/2ML IJ SOLN
INTRAMUSCULAR | Status: AC
  Filled 2018-12-28: qty 2

## 2018-12-28 NOTE — Anesthesia Preprocedure Evaluation (Signed)
Anesthesia Evaluation  Patient identified by MRN, date of birth, ID band Patient awake    Reviewed: Allergy & Precautions, H&P , NPO status , Patient's Chart, lab work & pertinent test results  Airway Mallampati: II   Neck ROM: full    Dental   Pulmonary Current Smoker,    breath sounds clear to auscultation       Cardiovascular negative cardio ROS   Rhythm:regular Rate:Normal     Neuro/Psych    GI/Hepatic Pancreatic mass   Endo/Other    Renal/GU      Musculoskeletal   Abdominal   Peds  Hematology   Anesthesia Other Findings   Reproductive/Obstetrics                             Anesthesia Physical Anesthesia Plan  ASA: II  Anesthesia Plan: MAC   Post-op Pain Management:    Induction: Intravenous  PONV Risk Score and Plan: 0 and Treatment may vary due to age or medical condition and Propofol infusion  Airway Management Planned: Nasal Cannula  Additional Equipment:   Intra-op Plan:   Post-operative Plan:   Informed Consent: I have reviewed the patients History and Physical, chart, labs and discussed the procedure including the risks, benefits and alternatives for the proposed anesthesia with the patient or authorized representative who has indicated his/her understanding and acceptance.       Plan Discussed with: CRNA, Anesthesiologist and Surgeon  Anesthesia Plan Comments:         Anesthesia Quick Evaluation

## 2018-12-28 NOTE — Transfer of Care (Signed)
Immediate Anesthesia Transfer of Care Note  Patient: Gabriel [name]  Procedure(s) Performed: UPPER ENDOSCOPIC ULTRASOUND (EUS) RADIAL (N/A )  Patient Location: PACU and Endoscopy Unit  Anesthesia Type:MAC  Level of Consciousness: oriented, drowsy and patient cooperative  Airway & Oxygen Therapy: Patient Spontanous Breathing and Patient connected to face mask oxygen  Post-op Assessment: Report given to RN and Post -op Vital signs reviewed and stable  Post vital signs: Reviewed and stable  Last Vitals:  Vitals Value Taken Time  BP    Temp    Pulse    Resp    SpO2      Last Pain:  Vitals:   12/28/18 1023  TempSrc: Oral  PainSc: 8          Complications: No apparent anesthesia complications

## 2018-12-28 NOTE — Interval H&P Note (Signed)
History and Physical Interval Note:  12/28/2018 12:41 PM  Gabriel [name]  has presented today for surgery, with the diagnosis of pancreatic mass-failure to thrive- weight loss.  The various methods of treatment have been discussed with the patient and family. After consideration of risks, benefits and other options for treatment, the patient has consented to  Procedure(s): UPPER ENDOSCOPIC ULTRASOUND (EUS) RADIAL (N/A) as a surgical intervention.  The patient's history has been reviewed, patient examined, no change in status, stable for surgery.  I have reviewed the patient's chart and labs.  Questions were answered to the patient's satisfaction.     Milus Banister

## 2018-12-28 NOTE — Anesthesia Procedure Notes (Signed)
Procedure Name: MAC Date/Time: 12/28/2018 1:15 PM Performed by: Lollie Sails, CRNA Pre-anesthesia Checklist: Patient identified, Emergency Drugs available, Suction available, Timeout performed and Patient being monitored Oxygen Delivery Method: Simple face mask

## 2018-12-28 NOTE — Discharge Instructions (Signed)
YOU HAD AN ENDOSCOPIC PROCEDURE TODAY: Refer to the procedure report and other information in the discharge instructions given to you for any specific questions about what was found during the examination. If this information does not answer your questions, please call [place] office at [phone] to clarify.   YOU SHOULD EXPECT: Some feelings of bloating in the abdomen. Passage of more gas than usual. Walking can help get rid of the air that was put into your GI tract during the procedure and reduce the bloating. If you had a lower endoscopy (such as a colonoscopy or flexible sigmoidoscopy) you may notice spotting of blood in your stool or on the toilet paper. Some abdominal soreness may be present for a day or two, also.  DIET: Your first meal following the procedure should be a light meal and then it is ok to progress to your normal diet. A half-sandwich or bowl of soup is an example of a good first meal. Heavy or fried foods are harder to digest and may make you feel nauseous or bloated. Drink plenty of fluids but you should avoid alcoholic beverages for 24 hours. If you had a esophageal dilation, please see attached instructions for diet.    ACTIVITY: Your care partner should take you home directly after the procedure. You should plan to take it easy, moving slowly for the rest of the day. You can resume normal activity the day after the procedure however YOU SHOULD NOT DRIVE, use power tools, machinery or perform tasks that involve climbing or major physical exertion for 24 hours (because of the sedation medicines used during the test).   SYMPTOMS TO REPORT IMMEDIATELY: A gastroenterologist can be reached at any hour. Please call [phone]  for any of the following symptoms:   Following upper endoscopy (EGD, EUS, ERCP, esophageal dilation) Vomiting of blood or coffee ground material  New, significant abdominal pain  New, significant chest pain or pain under the shoulder blades  Painful or  persistently difficult swallowing  New shortness of breath  Black, tarry-looking or red, bloody stools  FOLLOW UP:  If any biopsies were taken you will be contacted by phone or by letter within the next 1-3 weeks. Call [phone]  if you have not heard about the biopsies in 3 weeks.  Please also call with any specific questions about appointments or follow up tests.  

## 2018-12-28 NOTE — Progress Notes (Signed)
Spoke with Dr. Marcie Bal, BP withing range after 30 mg of Labetalol.  Per MD patient to discharge home

## 2018-12-28 NOTE — Op Note (Signed)
[facility] Patient Name: Gabriel [name] Procedure Date: 12/28/2018 MRN: [id number] Attending MD: Milus Banister , MD Date of Birth: [birth year] CSN: [id number] Age: 38 Admit Type: Inpatient Procedure:                Upper EUS Indications:              abdominal pain, significant weight loss and                            abnormal pancreas on CT Providers:                Milus Banister, MD, Cleda Daub, RN, Lina Sar, Technician, Cherylynn Ridges, Technician,                            Edman Circle. Zenia Resides CRNA, CRNA Referring MD:             Jolly Mango, MD Medicines:                Monitored Anesthesia Care Complications:            No immediate complications. Estimated blood loss:                            None. Estimated Blood Loss:     Estimated blood loss: none. Procedure:                Pre-Anesthesia Assessment:                           - Prior to the procedure, a History and Physical                            was performed, and patient medications and                            allergies were reviewed. The patient's tolerance of                            previous anesthesia was also reviewed. The risks                            and benefits of the procedure and the sedation                            options and risks were discussed with the patient.                            All questions were answered, and informed consent                            was obtained. Prior Anticoagulants: The patient has  taken no previous anticoagulant or antiplatelet                            agents. ASA Grade Assessment: II - A patient with                            mild systemic disease. After reviewing the risks                            and benefits, the patient was deemed in                            satisfactory condition to undergo the procedure.                           After obtaining informed  consent, the endoscope was                            passed under direct vision. Throughout the                            procedure, the patient's blood pressure, pulse, and                            oxygen saturations were monitored continuously. The                            [id number] Olympus Radial EUS was                            introduced through the mouth, and advanced to the                            second part of duodenum. The [id number]                            Olympus Linear EUS was introduced through the                            mouth, and advanced to the second part of duodenum.                            The upper EUS was accomplished without difficulty.                            The patient tolerated the procedure well. Scope In: Scope Out: Findings:      ENDOSCOPIC FINDING: :      The examined esophagus was endoscopically normal.      The entire examined stomach was endoscopically normal.      The examined duodenum was endoscopically normal.      ENDOSONOGRAPHIC FINDING: :      1. An irregular mass was identified in the pancreatic body. The mass was       hypoechoic and heterogenous. The mass  measured 24 mm by 19 mm in maximal       cross-sectional diameter. The endosonographic borders were       poorly-defined. The mass clearly involves the celiac trunk on this       examination, I could not see SMA well however. The remainder of the       pancreas was examined. The endosonographic appearance of parenchyma and       the upstream pancreatic duct indicated duct dilation and a maximum duct       diameter of 6 mm. Fine needle aspiration for cytology was performed.       Color Doppler imaging was utilized prior to needle puncture to confirm a       lack of significant vascular structures within the needle path. Two       passes were made with the 22 gauge needle using a transgastric approach.       A cytotechnologist was present to evaluate  the adequacy of the specimen.       Final cytology results are pending.      2. Soft tissue heterogeneous mass in anterior neck, measuring 3.6cm       maximally. This was not sampled, presumed to be large thyroid gland.      3. No peripancreatic adenopathy.      4. CBD was normal, non-dilated.      5. Limited views of liver, spleen were normal. 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 Await final                            path results.                           - Heterogeneous soft tissue mass in the neck,                            measuring 3.6cm maximally. This was not sampled,                            presumed to be a large thyroid gland. Moderate Sedation:      Not Applicable - Patient had care per Anesthesia. Recommendation:           - Discharge patient  to home (ambulatory). Procedure Code(s):        --- Professional ---                           [phone], Esophagogastroduodenoscopy, flexible,                            transoral; with transendoscopic ultrasound-guided                            intramural or transmural fine needle                            aspiration/biopsy(s), (includes endoscopic                            ultrasound examination limited to the esophagus,                            stomach or duodenum, and adjacent structures) Diagnosis Code(s):        --- Professional ---                           K86.89, Other specified diseases of pancreas                           R59.1, Generalized enlarged lymph nodes CPT copyright 2019 [employer]. All rights reserved. The  codes documented in this report are preliminary and upon coder review may  be revised to meet current compliance requirements. Milus Banister, MD 12/28/2018 2:00:45 PM This report has been signed electronically. Number of Addenda: 0

## 2018-12-29 ENCOUNTER — Encounter (HOSPITAL_COMMUNITY): Payer: Self-pay | Admitting: Gastroenterology

## 2018-12-29 NOTE — Anesthesia Postprocedure Evaluation (Signed)
Anesthesia Post Note  Patient: Gabriel [name]  Procedure(s) Performed: UPPER ENDOSCOPIC ULTRASOUND (EUS) RADIAL (N/A ) ESOPHAGOGASTRODUODENOSCOPY (EGD) WITH PROPOFOL (N/A ) FINE NEEDLE ASPIRATION BIOPSY     Patient location during evaluation: PACU Anesthesia Type: MAC Level of consciousness: awake and alert Pain management: pain level controlled Vital Signs Assessment: post-procedure vital signs reviewed and stable Respiratory status: spontaneous breathing, nonlabored ventilation, respiratory function stable and patient connected to nasal cannula oxygen Cardiovascular status: stable and blood pressure returned to baseline Postop Assessment: no apparent nausea or vomiting Anesthetic complications: no    Last Vitals:  Vitals:   12/28/18 1520 12/28/18 1525  BP: (!) 156/97 (!) 146/99  Pulse: 72 72  Resp: (!) 21 20  Temp:    SpO2: 99% 99%    Last Pain:  Vitals:   12/29/18 1042  TempSrc:   PainSc: 0-No pain                 Davanee Klinkner S

## 2019-01-03 ENCOUNTER — Other Ambulatory Visit: Payer: Self-pay

## 2019-01-03 ENCOUNTER — Telehealth: Payer: Self-pay | Admitting: Gastroenterology

## 2019-01-03 DIAGNOSIS — C259 Malignant neoplasm of pancreas, unspecified: Secondary | ICD-10-CM

## 2019-01-03 DIAGNOSIS — K8689 Other specified diseases of pancreas: Secondary | ICD-10-CM

## 2019-01-03 LAB — CYTOLOGY - NON PAP

## 2019-01-03 NOTE — Telephone Encounter (Signed)
I spoke with Dr Lyndon Code?? At pathology and he tells me he thought he released it but did not.  He is releasing now.

## 2019-01-03 NOTE — Telephone Encounter (Signed)
Not sure what happened with his path but it is not back yet from EUS last Thursday. Can you check on it?  Thanks

## 2019-01-04 ENCOUNTER — Telehealth: Payer: Self-pay | Admitting: Hematology

## 2019-01-04 NOTE — Telephone Encounter (Signed)
Gabriel [name] has been cld and scheduled to see Dr. Maylon Peppers on 10/7 at 1030am w/labs at 10am. Pt aware to arrive 15-20 minutes early.

## 2019-01-06 ENCOUNTER — Other Ambulatory Visit: Payer: Self-pay | Admitting: Hematology

## 2019-01-06 DIAGNOSIS — C259 Malignant neoplasm of pancreas, unspecified: Secondary | ICD-10-CM

## 2019-01-06 NOTE — Progress Notes (Signed)
Gabriel [name] CONSULT NOTE  Patient Care Team: Kerin Perna, NP as PCP - General (Internal Medicine)  HEME/ONC OVERVIEW: 1. 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  He tried a variety of OTC therapies, including PPI, without any improvement.  He presented to the ER on several occasions for these symptoms, including a visit in the end of 11/2018, but CT abdomen/pelvis without contrast at that time did not show any intra-abdominal abnormality. He was also evaluated in an ER in [place], [place] in early 12/2018 that did not show any abnormality on CT. He was finally seen again in mid-12/2018 at [facility] ER, during which CT abdomen/pelvis showed a pancreatic body mass involving at least the SMA. He underwent EUS that showed a ~2.5cm pancreatic body mass with definite involvement of the celiac axis, but there was no peri-pancreatic adenopathy. Biopsy of the pancreatic mass showed adenocarcinoma. Patient was referred to oncology for further evaluation.  -I discussed the imaging and pathology results in detail with the patient -I also reviewed the NCCN guideline in detail with the  patient -Unfortunately, due to the SMA involvement, the case was deemed not operable by surgical oncology  -I have ordered PET scan to assess for any occult metastases -I also have requested molecular testing on the recent pancreatic biopsy, including MSI  -In addition, I have referred him for genetic counseling and evaluation, given the early onset of the pancreatic cancer -Furthermore,I have consulted nutrition for recommendations, and if the patient continues to lose weight rapidly, we may consider feeding tube placement  -We discussed some of the risks, benefits and side-effects of gemcitabine and Abraxane.  Due to the patient's frailty, I will plan to start q2week treatment with reduced doses of chemotherapy.  -Some of the short term side-effects included, though not limited to, risk of fatigue, weight loss, tumor lysis syndrome, risk of allergic reactions, pancytopenia, life-threatening infections, need for transfusions of blood products, nausea, vomiting, change in bowel habits, admission to hospital for various reasons, and risks of death.  -Long term side-effects are also discussed including permanent damage to nerve function, chronic fatigue, and rare secondary malignancy including bone marrow disorders.  -The patient is aware that the response rates discussed earlier is not guaranteed.   -After a long discussion, patient made an informed decision to proceed with the prescribed plan of care.  -To facilitate treatment, I have ordered port placement -Tentative chemotherapy education and treatment to start on 01/24/2019   Abdominal pain -Secondary to vascular and nerve involvement by pancreatic cancer  -Pain not well controlled by PRN Percocet -I have prescribe MS-Contin 62m BID and IR morphine [id number] PRN for pain control -We can also consider celiac nerve block if pain remains poorly controlled despite high-dose pain medications  Diarrhea -Possibly due to pancreatic  insufficiency -I have prescribed Creon TID w/ meals and the GI navigator will assist the patient with patient assistance application  -Nutritional consult as above  Goals of care discussion -We discussed that in the setting of unresectable pancreatic cancer, chemotherapy and/or chemoradiation is for palliative intent, NOT curative -I also emphasized the importance of balancing the benefits and potential toxicities of treatment, and if the treatment is causing him significant suffering and impact on quality of life, then we will need to reconsider the approach -Patient expressed understanding and was in agreement with the plan  Orders Placed This Encounter  Procedures  . NM PET Image Initial (PI) Skull Base To Thigh    Standing Status:   Future    Standing Expiration Date:   01/10/2020    Order Specific Question:   If indicated for the ordered procedure, I authorize the administration of a radiopharmaceutical per Radiology protocol    Answer:   Yes    Order Specific Question:   Preferred imaging location?    Answer:   [facility]    Order Specific Question:   Radiology Contrast Protocol - do NOT remove file path    Answer:   [email or web address]  . IR IMAGING GUIDED PORT INSERTION    Standing Status:   Future    Standing Expiration Date:   03/11/2020    Order Specific Question:   Reason for Exam (SYMPTOM  OR DIAGNOSIS REQUIRED)    Answer:   Pancreatic cancer needing chemo access    Order Specific Question:   Preferred Imaging Location?    Answer:   [facility]  . Ambulatory referral to Genetics    Referral Priority:   Urgent    Referral Type:   Consultation    Referral Reason:   Specialty Services Required    Number of Visits Requested:   1  . Ambulatory referral to Nutrition and Diabetic E    Referral Priority:   Urgent    Referral Type:   Consultation    Referral Reason:   Specialty Services Required    Number of Visits Requested:   1    All questions were answered. The patient knows to call the clinic with any problems, questions or concerns.  Return in 2 weeks for labs, port flush, clinic appt, chemo ed and 1st dose of Gemcitabine/Abraxane.  Tish Men, MD 01/10/2019 11:48 AM   CHIEF COMPLAINTS/PURPOSE OF CONSULTATION:  "My stomach hurts"  HISTORY OF PRESENTING ILLNESS:  Gabriel [name] 38 y.o. male is here because of newly diagnosed pancreatic adenocarcinoma.  Patient presented to GI for evaluation of a new pancreatic mass after having had 3-4 months of progressive weight loss, poor appetite, and persistent abdominal pain.  He tried a variety of OTC therapies, including PPI, without any improvement.  He presented to the ER on several occasions for these symptoms, including a visit in the end of 11/2018, but CT abdomen/pelvis without contrast at that time did not show any intra-abdominal abnormality. He was also evaluated in an ER in [place], [place] in early 12/2018 that did not show any abnormality on CT. He was finally seen again in mid-12/2018 at [facility] ER, during which CT abdomen/pelvis showed a pancreatic body mass involving at least the SMA. He underwent EUS that showed a ~2.5cm pancreatic body mass with definite involvement of the celiac axis, but there was no peri-pancreatic adenopathy. Biopsy of the pancreatic mass showed adenocarcinoma. Patient was referred to oncology for further  evaluation.   Today, patient reports constant, sharp pain in the RUQ and epigastric area, radiating to the back, for which he takes Percocet 5/325 twice a day with very modest pain improvement.  Sometimes the pain wakes him up from sleep.  He also reports intermittent watery diarrhea without any hematochezia or melena every 2 to 3 days.  He has lost approximately 50 pounds over the past 6 months due to poor appetite.  He also reports chills, but denies any fever, night sweats, or lymphadenopathy.  He has moderate exertional dyspnea due to abdominal pain  and fatigue, limiting his ability to perform ADLs at home.   Of note, patient maternal grandfather was diagnosed with prostate cancer in his 45s.  His mother was diagnosed with breast cancer at age 45 and again at age 21.  He has 2 brothers (age 15 and 50), neither of whom has any cancer diagnosis.  He also has 3 children, ages 80, 40 and 43, who are healthy.  REVIEW OF SYSTEMS:   Constitutional: ( - ) fevers, ( + )  chills , ( - ) night sweats Eyes: ( - ) blurriness of vision, ( - ) double vision, ( - ) watery eyes Ears, nose, mouth, throat, and face: ( - ) mucositis, ( - ) sore throat Respiratory: ( - ) cough, ( + ) dyspnea, ( - ) wheezes Cardiovascular: ( - ) palpitation, ( - ) chest discomfort, ( - ) lower extremity swelling Gastrointestinal:  ( - ) nausea, ( - ) heartburn, ( + ) change in bowel habits Skin: ( - ) abnormal skin rashes Lymphatics: ( - ) new lymphadenopathy, ( - ) easy bruising Neurological: ( - ) numbness, ( - ) tingling, ( - ) new weaknesses Behavioral/Psych: ( - ) mood change, ( - ) new changes  All other systems were reviewed with the patient and are negative.  I have reviewed his chart and materials related to his cancer extensively and collaborated history with the patient. Summary of oncologic history is as follows: Oncology History  Pancreatic adenocarcinoma [facility])   Initial Diagnosis   Pancreatic adenocarcinoma ([place])   12/15/2018 Imaging   CT abdomen/pelvis w/ contrast: IMPRESSION: 1. Suggestion of a 1.5 cm mass within the mid aspect of the pancreatic body with associated atrophy of the upstream pancreatic tail and associated pancreatic ductal dilatation. Recommend further evaluation with MRI. There is soft tissue fullness around the superior mesenteric artery and celiac axis. This may represent edema or potentially tumor involvement. 2. [name]-in-bud nodularity within the peripheral aspect of the lower lobes bilaterally which may be secondary to an infectious  or inflammatory process. Recommend follow-up chest CT in 3 months to assess for interval change. 3. These results will be called to the ordering clinician or representative by the Radiologist Assistant, and communication documented in the PACS or zVision Dashboard.   12/29/2018 Procedure   EUS: -2.4 x 1.9cm mass in the body of the pancreas causing the main pancreatic duct obstruction and clearly involving the celiac trunk. Biopsied. -Heterogenous soft tissue mass in the neck, measuring 3.6cm maximally. This was not sampled, presumed to be a large thyroid gland.  -No peripancreatic adenopathy -CBD was normal, non-dilated.   12/29/2018 Pathology Results   CASE: [id number]   DIAGNOSIS:  - Malignant cells consistent with adenocarcinoma  - See comment.     MEDICAL HISTORY:  Past Medical History:  Diagnosis Date  . Pancreatic mass     SURGICAL HISTORY: Past Surgical History:  Procedure Laterality Date  . ESOPHAGOGASTRODUODENOSCOPY (EGD) WITH PROPOFOL N/A 12/28/2018   Procedure: ESOPHAGOGASTRODUODENOSCOPY (EGD) WITH PROPOFOL;  Surgeon: Milus Banister, MD;  Location: [facility] ENDOSCOPY;  Service: Endoscopy;  Laterality: N/A;  . EUS N/A 12/28/2018   Procedure: UPPER ENDOSCOPIC ULTRASOUND (EUS) RADIAL;  Surgeon: Milus Banister, MD;  Location: [facility] ENDOSCOPY;  Service: Endoscopy;  Laterality: N/A;  . FINE NEEDLE ASPIRATION BIOPSY  12/28/2018   Procedure: FINE NEEDLE ASPIRATION BIOPSY;  Surgeon: Milus Banister, MD;  Location: [facility] ENDOSCOPY;  Service: Endoscopy;;  . none      SOCIAL HISTORY: Social History   Socioeconomic History  . Marital status: Single    Spouse name: Not on file  . Number of children: Not on file  . Years of education: Not on file  . Highest education level: Not on file  Occupational History  . Not on file  Social Needs  . Financial resource strain: Not on file  . Food insecurity    Worry: Not on file    Inability: Not on file  . Transportation needs     Medical: Not on file    Non-medical: Not on file  Tobacco Use  . Smoking status: Current Every Day Smoker    Types: E-cigarettes  . Smokeless tobacco: Never Used  Substance and Sexual Activity  . Alcohol use: No  . Drug use: No  . Sexual activity: Not on file  Lifestyle  . Physical activity    Days per week: Not on file    Minutes per session: Not on file  . Stress: Not on file  Relationships  . Social Herbalist on phone: Not on file    Gets together: Not on file    Attends religious service: Not on file    Active member of club or organization: Not on file    Attends meetings of clubs or organizations: Not on file    Relationship status: Not on file  . Intimate partner violence    Fear of current or ex partner: Not on file    Emotionally abused: Not on file    Physically abused: Not on file    Forced sexual activity: Not on file  Other Topics Concern  . Not on file  Social History Narrative  . Not on file    FAMILY HISTORY: Family History  Problem Relation Age of Onset  . Breast cancer Mother   . Esophageal cancer Neg Hx   . Colon cancer Neg Hx   . Pancreatic cancer Neg Hx   . Liver disease Neg Hx   . Stomach cancer Neg Hx     ALLERGIES:  is allergic to penicillins.  MEDICATIONS:  Current Outpatient Medications  Medication Sig Dispense Refill  . cyclobenzaprine (FLEXERIL) 10 MG tablet Take 10 mg by mouth 3 (three) times daily as needed for muscle spasms.    . diphenhydramine-acetaminophen (TYLENOL PM) 25-500 MG TABS tablet Take 2 tablets by mouth at bedtime as needed (sleep).    . lipase/protease/amylase (CREON) 36000 UNITS CPEP capsule Take 1 capsule (36,000 Units total) by mouth 3 (three) times daily with meals. 90 capsule 5  . morphine (MS CONTIN) 15 MG 12 hr tablet Take 1 tablet (15 mg total) by mouth every 12 (twelve) hours. 60 tablet 0  . morphine (MSIR) 15 MG tablet Take 1 tablet (15 mg total) by mouth every 6 (six) hours as needed for severe  pain. 60 tablet 0  . oxyCODONE-acetaminophen (PERCOCET/ROXICET) 5-325 MG  tablet Take 1 tablet by mouth every 4 (four) hours as needed for severe pain. 42 tablet 0   No current facility-administered medications for this visit.    Facility-Administered Medications Ordered in Other Visits  Medication Dose Route Frequency Provider Last Rate Last Dose  . oxyCODONE-acetaminophen (PERCOCET/ROXICET) 5-325 MG per tablet 2 tablet  2 tablet Oral Once Tish Men, MD        PHYSICAL EXAMINATION: ECOG PERFORMANCE STATUS: 2 - Symptomatic, <50% confined to bed  Vitals:   01/10/19 1034  BP: 118/88  Pulse: 78  Resp: 16  Temp: 98.7 F (37.1 C)  SpO2: 98%   Filed Weights    GENERAL: alert, bending over in a wheelchair due to abdominal pain, thin, frail-appearing  SKIN: skin color, texture, turgor are normal, no rashes or significant lesions EYES: conjunctiva are pink and non-injected, sclera clear OROPHARYNX: no exudate, no erythema; lips, buccal mucosa, and tongue normal  NECK: supple, non-tender LYMPH:  no palpable lymphadenopathy in the cervical LUNGS: clear to auscultation with normal breathing effort HEART: regular rate & rhythm, no murmurs, no lower extremity edema ABDOMEN: soft, non-tender, non-distended, normal bowel sounds Musculoskeletal: very little muscle mass PSYCH: alert & oriented x 3, fluent speech NEURO: no focal motor/sensory deficits  LABORATORY DATA:  I have reviewed the data as listed Lab Results  Component Value Date   WBC 4.7 01/10/2019   HGB 15.9 01/10/2019   HCT 46.6 01/10/2019   MCV 85.0 01/10/2019   PLT 271 01/10/2019   Lab Results  Component Value Date   NA 136 01/10/2019   K 4.1 01/10/2019   CL 101 01/10/2019   CO2 29 01/10/2019    RADIOGRAPHIC STUDIES: I have personally reviewed the radiological images as listed and agreed with the findings in the report. Ct Abdomen Pelvis W Contrast  Result Date: 12/15/2018 CLINICAL DATA:  Patient with right lower  quadrant abdominal pain for 1 year. Extensive weight loss. EXAM: CT ABDOMEN AND PELVIS WITH CONTRAST TECHNIQUE: Multidetector CT imaging of the abdomen and pelvis was performed using the standard protocol following bolus administration of intravenous contrast. CONTRAST:  72m OMNIPAQUE IOHEXOL 300 MG/ML  SOLN COMPARISON:  CT abdomen pelvis 11/29/2018; CT abdomen pelvis 10/25/2018 FINDINGS: Lower chest: Normal heart size. [name]-in-bud nodularity within the peripheral aspect of the lower lobes bilaterally. No pleural effusion. Hepatobiliary: Liver is normal in size and contour. Subcentimeter too small to characterize low-attenuation lesion hepatic dome. Gallbladder is unremarkable. No intrahepatic or extrahepatic biliary ductal dilatation. Pancreas: Within the mid aspect of the pancreatic body (image 21; series 2) there is suggestion of a small mass measuring approximately 1.5 cm. There is soft tissue fullness around the celiac axis (image 18; series 2) and superior mesenteric artery. There is atrophy of the upstream pancreatic tail with associated pancreatic ductal dilatation measuring up to 4 mm. Spleen: Unremarkable Adrenals/Urinary Tract: Normal adrenal glands. Kidneys enhance symmetrically with contrast. Urinary bladder is unremarkable. Stomach/Bowel: Oral contrast material throughout the small and large bowel. No evidence for bowel obstruction. No free fluid or free intraperitoneal air. Vascular/Lymphatic: Normal caliber abdominal aorta. Peripheral calcified atherosclerotic plaque. No retroperitoneal lymphadenopathy. The splenic vein is not patent. Reproductive: Unremarkable. Other: None. Musculoskeletal: No aggressive or acute appearing osseous lesions. IMPRESSION: 1. Suggestion of a 1.5 cm mass within the mid aspect of the pancreatic body with associated atrophy of the upstream pancreatic tail and associated pancreatic ductal dilatation. Recommend further evaluation with MRI. There is soft tissue fullness  around the superior mesenteric artery and  celiac axis. This may represent edema or potentially tumor involvement. 2. [name]-in-bud nodularity within the peripheral aspect of the lower lobes bilaterally which may be secondary to an infectious or inflammatory process. Recommend follow-up chest CT in 3 months to assess for interval change. 3. These results will be called to the ordering clinician or representative by the Radiologist Assistant, and communication documented in the PACS or zVision Dashboard. Electronically Signed   By: Lovey Newcomer M.D.   On: 12/15/2018 18:43    PATHOLOGY: I have reviewed the pathology reports as documented in the oncologist history.

## 2019-01-10 ENCOUNTER — Inpatient Hospital Stay: Payer: Medicaid Other | Attending: Hematology | Admitting: Hematology

## 2019-01-10 ENCOUNTER — Other Ambulatory Visit: Payer: Self-pay

## 2019-01-10 ENCOUNTER — Encounter: Payer: Self-pay | Admitting: Hematology

## 2019-01-10 ENCOUNTER — Other Ambulatory Visit: Payer: Self-pay | Admitting: *Deleted

## 2019-01-10 ENCOUNTER — Telehealth: Payer: Self-pay | Admitting: Hematology

## 2019-01-10 ENCOUNTER — Inpatient Hospital Stay: Payer: Medicaid Other

## 2019-01-10 VITALS — BP 118/88 | HR 78 | Temp 98.7°F | Resp 16 | Ht 68.0 in

## 2019-01-10 DIAGNOSIS — Z7189 Other specified counseling: Secondary | ICD-10-CM

## 2019-01-10 DIAGNOSIS — R630 Anorexia: Secondary | ICD-10-CM | POA: Diagnosis not present

## 2019-01-10 DIAGNOSIS — G893 Neoplasm related pain (acute) (chronic): Secondary | ICD-10-CM | POA: Diagnosis not present

## 2019-01-10 DIAGNOSIS — Z5111 Encounter for antineoplastic chemotherapy: Secondary | ICD-10-CM | POA: Insufficient documentation

## 2019-01-10 DIAGNOSIS — C259 Malignant neoplasm of pancreas, unspecified: Secondary | ICD-10-CM

## 2019-01-10 DIAGNOSIS — K909 Intestinal malabsorption, unspecified: Secondary | ICD-10-CM

## 2019-01-10 DIAGNOSIS — R197 Diarrhea, unspecified: Secondary | ICD-10-CM | POA: Insufficient documentation

## 2019-01-10 DIAGNOSIS — C251 Malignant neoplasm of body of pancreas: Secondary | ICD-10-CM | POA: Insufficient documentation

## 2019-01-10 DIAGNOSIS — R634 Abnormal weight loss: Secondary | ICD-10-CM | POA: Diagnosis not present

## 2019-01-10 LAB — CMP (CANCER CENTER ONLY)
ALT: 158 U/L — ABNORMAL HIGH (ref 0–44)
AST: 73 U/L — ABNORMAL HIGH (ref 15–41)
Albumin: 4 g/dL (ref 3.5–5.0)
Alkaline Phosphatase: 75 U/L (ref 38–126)
Anion gap: 6 (ref 5–15)
BUN: 10 mg/dL (ref 6–20)
CO2: 29 mmol/L (ref 22–32)
Calcium: 9.3 mg/dL (ref 8.9–10.3)
Chloride: 101 mmol/L (ref 98–111)
Creatinine: 0.79 mg/dL (ref 0.61–1.24)
GFR, Est AFR Am: >60 mL/min (ref >60)
GFR, Estimated: >60 mL/min (ref >60)
Glucose, Bld: 107 mg/dL — ABNORMAL HIGH (ref 70–99)
Potassium: 4.1 mmol/L (ref 3.5–5.1)
Sodium: 136 mmol/L (ref 135–145)
Total Bilirubin: 0.4 mg/dL (ref 0.3–1.2)
Total Protein: 7.2 g/dL (ref 6.5–8.1)

## 2019-01-10 LAB — LIPASE, BLOOD: Lipase: 90 U/L — ABNORMAL HIGH (ref 11–51)

## 2019-01-10 LAB — CBC WITH DIFFERENTIAL (CANCER CENTER ONLY)
Abs Immature Granulocytes: 0.01 10*3/uL (ref 0.00–0.07)
Basophils Absolute: 0 10*3/uL (ref 0.0–0.1)
Basophils Relative: 0 %
Eosinophils Absolute: 0 10*3/uL (ref 0.0–0.5)
Eosinophils Relative: 0 %
HCT: 46.6 % (ref 39.0–52.0)
Hemoglobin: 15.9 g/dL (ref 13.0–17.0)
Immature Granulocytes: 0 %
Lymphocytes Relative: 20 %
Lymphs Abs: 1 10*3/uL (ref 0.7–4.0)
MCH: 29 pg (ref 26.0–34.0)
MCHC: 34.1 g/dL (ref 30.0–36.0)
MCV: 85 fL (ref 80.0–100.0)
Monocytes Absolute: 0.3 10*3/uL (ref 0.1–1.0)
Monocytes Relative: 7 %
Neutro Abs: 3.4 10*3/uL (ref 1.7–7.7)
Neutrophils Relative %: 73 %
Platelet Count: 271 10*3/uL (ref 150–400)
RBC: 5.48 MIL/uL (ref 4.22–5.81)
RDW: 13.1 % (ref 11.5–15.5)
WBC Count: 4.7 10*3/uL (ref 4.0–10.5)
nRBC: 0 % (ref 0.0–0.2)

## 2019-01-10 MED ORDER — MORPHINE SULFATE ER 15 MG PO TBCR: 15.0000 mg | EXTENDED_RELEASE_TABLET | Freq: Two times a day (BID) | ORAL | 0 refills | Status: DC

## 2019-01-10 MED ORDER — MORPHINE SULFATE 15 MG PO TABS: 15.0000 mg | ORAL_TABLET | Freq: Four times a day (QID) | ORAL | 0 refills | Status: DC | PRN

## 2019-01-10 MED ORDER — OXYCODONE-ACETAMINOPHEN 5-325 MG PO TABS
ORAL_TABLET | ORAL | Status: AC
  Filled 2019-01-10: qty 2

## 2019-01-10 MED ORDER — PANCRELIPASE (LIP-PROT-AMYL) 36000-114000 UNITS PO CPEP: 36000.0000 [IU] | ORAL_CAPSULE | Freq: Three times a day (TID) | ORAL | 5 refills | Status: AC

## 2019-01-10 MED ORDER — OXYCODONE-ACETAMINOPHEN 5-325 MG PO TABS
2.0000 | ORAL_TABLET | Freq: Once | ORAL | Status: AC
  Administered 2019-01-10: 2 via ORAL

## 2019-01-10 MED ORDER — OXYCODONE-ACETAMINOPHEN 5-325 MG PO TABS: 2.0000 | ORAL_TABLET | Freq: Once | ORAL | Status: AC

## 2019-01-10 NOTE — Progress Notes (Signed)
Dr. Maylon Peppers made aware of pt's pain of 8/10 in his lower back. Received VO for percocet 5/325 x2 tabs one time only.  Given to patient during his visit with Dr. Maylon Peppers.

## 2019-01-10 NOTE — Patient Instructions (Signed)
Gemcitabine injection What is this medicine? GEMCITABINE (jem SYE ta been) is a chemotherapy drug. This medicine is used to treat many types of cancer like breast cancer, lung cancer, pancreatic cancer, and ovarian cancer. This medicine may be used for other purposes; ask your health care provider or pharmacist if you have questions. COMMON BRAND NAME(S): Gemzar, Infugem What should I tell my health care provider before I take this medicine? They need to know if you have any of these conditions:  blood disorders  infection  kidney disease  liver disease  lung or breathing disease, like asthma  recent or ongoing radiation therapy  an unusual or allergic reaction to gemcitabine, other chemotherapy, other medicines, foods, dyes, or preservatives  pregnant or trying to get pregnant  breast-feeding How should I use this medicine? This drug is given as an infusion into a vein. It is administered in a hospital or clinic by a specially trained health care professional. Talk to your pediatrician regarding the use of this medicine in children. Special care may be needed. Overdosage: If you think you have taken too much of this medicine contact a poison control center or emergency room at once. NOTE: This medicine is only for you. Do not share this medicine with others. What if I miss a dose? It is important not to miss your dose. Call your doctor or health care professional if you are unable to keep an appointment. What may interact with this medicine?  medicines to increase blood counts like filgrastim, pegfilgrastim, sargramostim  some other chemotherapy drugs like cisplatin  vaccines Talk to your doctor or health care professional before taking any of these medicines:  acetaminophen  aspirin  ibuprofen  ketoprofen  naproxen This list may not describe all possible interactions. Give your health care provider a list of all the medicines, herbs, non-prescription drugs, or  dietary supplements you use. Also tell them if you smoke, drink alcohol, or use illegal drugs. Some items may interact with your medicine. What should I watch for while using this medicine? Visit your doctor for checks on your progress. This drug may make you feel generally unwell. This is not uncommon, as chemotherapy can affect healthy cells as well as cancer cells. Report any side effects. Continue your course of treatment even though you feel ill unless your doctor tells you to stop. In some cases, you may be given additional medicines to help with side effects. Follow all directions for their use. Call your doctor or health care professional for advice if you get a fever, chills or sore throat, or other symptoms of a cold or flu. Do not treat yourself. This drug decreases your body's ability to fight infections. Try to avoid being around people who are sick. This medicine may increase your risk to bruise or bleed. Call your doctor or health care professional if you notice any unusual bleeding. Be careful brushing and flossing your teeth or using a toothpick because you may get an infection or bleed more easily. If you have any dental work done, tell your dentist you are receiving this medicine. Avoid taking products that contain aspirin, acetaminophen, ibuprofen, naproxen, or ketoprofen unless instructed by your doctor. These medicines may hide a fever. Do not become pregnant while taking this medicine or for 6 months after stopping it. Women should inform their doctor if they wish to become pregnant or think they might be pregnant. Men should not father a child while taking this medicine and for 3 months after stopping it.   There is a potential for serious side effects to an unborn child. Talk to your health care professional or pharmacist for more information. Do not breast-feed an infant while taking this medicine or for at least 1 week after stopping it. Men should inform their doctors if they wish  to father a child. This medicine may lower sperm counts. Talk with your doctor or health care professional if you are concerned about your fertility. What side effects may I notice from receiving this medicine? Side effects that you should report to your doctor or health care professional as soon as possible:  allergic reactions like skin rash, itching or hives, swelling of the face, lips, or tongue  breathing problems  pain, redness, or irritation at site where injected  signs and symptoms of a dangerous change in heartbeat or heart rhythm like chest pain; dizziness; fast or irregular heartbeat; palpitations; feeling faint or lightheaded, falls; breathing problems  signs of decreased platelets or bleeding - bruising, pinpoint red spots on the skin, black, tarry stools, blood in the urine  signs of decreased red blood cells - unusually weak or tired, feeling faint or lightheaded, falls  signs of infection - fever or chills, cough, sore throat, pain or difficulty passing urine  signs and symptoms of kidney injury like trouble passing urine or change in the amount of urine  signs and symptoms of liver injury like dark yellow or brown urine; general ill feeling or flu-like symptoms; light-colored stools; loss of appetite; nausea; right upper belly pain; unusually weak or tired; yellowing of the eyes or skin  swelling of ankles, feet, hands Side effects that usually do not require medical attention (report to your doctor or health care professional if they continue or are bothersome):  constipation  diarrhea  hair loss  loss of appetite  nausea  rash  vomiting This list may not describe all possible side effects. Call your doctor for medical advice about side effects. You may report side effects to FDA at 1-800-FDA-1088. Where should I keep my medicine? This drug is given in a hospital or clinic and will not be stored at home. NOTE: This sheet is a summary. It may not cover all  possible information. If you have questions about this medicine, talk to your doctor, pharmacist, or health care provider.  2020 Elsevier/Gold Standard (2017-06-15 18:06:11)    Nanoparticle Albumin-Bound Paclitaxel injection What is this medicine? NANOPARTICLE ALBUMIN-BOUND PACLITAXEL (Na no PAHR ti kuhl al BYOO muhn-bound PAK li TAX el) is a chemotherapy drug. It targets fast dividing cells, like cancer cells, and causes these cells to die. This medicine is used to treat advanced breast cancer, lung cancer, and pancreatic cancer. This medicine may be used for other purposes; ask your health care provider or pharmacist if you have questions. COMMON BRAND NAME(S): Abraxane What should I tell my health care provider before I take this medicine? They need to know if you have any of these conditions:  kidney disease  liver disease  low blood counts, like low white cell, platelet, or red cell counts  lung or breathing disease, like asthma  tingling of the fingers or toes, or other nerve disorder  an unusual or allergic reaction to paclitaxel, albumin, other chemotherapy, other medicines, foods, dyes, or preservatives  pregnant or trying to get pregnant  breast-feeding How should I use this medicine? This drug is given as an infusion into a vein. It is administered in a hospital or clinic by a specially trained health care   professional. Talk to your pediatrician regarding the use of this medicine in children. Special care may be needed. Overdosage: If you think you have taken too much of this medicine contact a poison control center or emergency room at once. NOTE: This medicine is only for you. Do not share this medicine with others. What if I miss a dose? It is important not to miss your dose. Call your doctor or health care professional if you are unable to keep an appointment. What may interact with this medicine? This medicine may interact with the following  medications:  antiviral medicines for hepatitis, HIV or AIDS  certain antibiotics like erythromycin and clarithromycin  certain medicines for fungal infections like ketoconazole and itraconazole  certain medicines for seizures like carbamazepine, phenobarbital, phenytoin  gemfibrozil  nefazodone  rifampin  St. John's wort This list may not describe all possible interactions. Give your health care provider a list of all the medicines, herbs, non-prescription drugs, or dietary supplements you use. Also tell them if you smoke, drink alcohol, or use illegal drugs. Some items may interact with your medicine. What should I watch for while using this medicine? Your condition will be monitored carefully while you are receiving this medicine. You will need important blood work done while you are taking this medicine. This medicine can cause serious allergic reactions. If you experience allergic reactions like skin rash, itching or hives, swelling of the face, lips, or tongue, tell your doctor or health care professional right away. In some cases, you may be given additional medicines to help with side effects. Follow all directions for their use. This drug may make you feel generally unwell. This is not uncommon, as chemotherapy can affect healthy cells as well as cancer cells. Report any side effects. Continue your course of treatment even though you feel ill unless your doctor tells you to stop. Call your doctor or health care professional for advice if you get a fever, chills or sore throat, or other symptoms of a cold or flu. Do not treat yourself. This drug decreases your body's ability to fight infections. Try to avoid being around people who are sick. This medicine may increase your risk to bruise or bleed. Call your doctor or health care professional if you notice any unusual bleeding. Be careful brushing and flossing your teeth or using a toothpick because you may get an infection or bleed  more easily. If you have any dental work done, tell your dentist you are receiving this medicine. Avoid taking products that contain aspirin, acetaminophen, ibuprofen, naproxen, or ketoprofen unless instructed by your doctor. These medicines may hide a fever. Do not become pregnant while taking this medicine or for 6 months after stopping it. Women should inform their doctor if they wish to become pregnant or think they might be pregnant. Men should not father a child while taking this medicine or for 3 months after stopping it. There is a potential for serious side effects to an unborn child. Talk to your health care professional or pharmacist for more information. Do not breast-feed an infant while taking this medicine or for 2 weeks after stopping it. This medicine may interfere with the ability to get pregnant or to father a child. You should talk to your doctor or health care professional if you are concerned about your fertility. What side effects may I notice from receiving this medicine? Side effects that you should report to your doctor or health care professional as soon as possible:  allergic reactions   like skin rash, itching or hives, swelling of the face, lips, or tongue  breathing problems  changes in vision  fast, irregular heartbeat  low blood pressure  mouth sores  pain, tingling, numbness in the hands or feet  signs of decreased platelets or bleeding - bruising, pinpoint red spots on the skin, black, tarry stools, blood in the urine  signs of decreased red blood cells - unusually weak or tired, feeling faint or lightheaded, falls  signs of infection - fever or chills, cough, sore throat, pain or difficulty passing urine  signs and symptoms of liver injury like dark yellow or brown urine; general ill feeling or flu-like symptoms; light-colored stools; loss of appetite; nausea; right upper belly pain; unusually weak or tired; yellowing of the eyes or skin  swelling of  the ankles, feet, hands  unusually slow heartbeat Side effects that usually do not require medical attention (report to your doctor or health care professional if they continue or are bothersome):  diarrhea  hair loss  loss of appetite  nausea, vomiting  tiredness This list may not describe all possible side effects. Call your doctor for medical advice about side effects. You may report side effects to FDA at 1-800-FDA-1088. Where should I keep my medicine? This drug is given in a hospital or clinic and will not be stored at home. NOTE: This sheet is a summary. It may not cover all possible information. If you have questions about this medicine, talk to your doctor, pharmacist, or health care provider.  2020 Elsevier/Gold Standard (2016-11-23 13:03:45)  

## 2019-01-10 NOTE — Addendum Note (Signed)
Addended by: Tish Men on: 01/10/2019 12:52 PM   Modules accepted: Orders

## 2019-01-10 NOTE — Telephone Encounter (Signed)
Scheduled per 10/07 los, patient receive after visit summary and calender.

## 2019-01-10 NOTE — Progress Notes (Signed)
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

## 2019-01-11 ENCOUNTER — Encounter: Payer: Self-pay | Admitting: General Practice

## 2019-01-11 ENCOUNTER — Telehealth: Payer: Self-pay

## 2019-01-11 ENCOUNTER — Other Ambulatory Visit (INDEPENDENT_AMBULATORY_CARE_PROVIDER_SITE_OTHER): Payer: Self-pay | Admitting: Primary Care

## 2019-01-11 DIAGNOSIS — C259 Malignant neoplasm of pancreas, unspecified: Secondary | ICD-10-CM

## 2019-01-11 LAB — CANCER ANTIGEN 19-9: CA 19-9: 275 U/mL — ABNORMAL HIGH (ref 0–35)

## 2019-01-11 NOTE — Progress Notes (Signed)
[place] Initial Psychosocial Assessment Clinical Social Work  Clinical Social Work contacted by phone to assess psychosocial, emotional, mental health, and spiritual needs of the patient.   Barriers to care/review of distress screen:  - Transportation:  Do you anticipate any problems getting to appointments?  Do you have someone who can help run errands for you if you need it?  Celesta Gentile transports to appointments.  Can run errands if needed.  - Help at home:  What is your living situation (alone, family, other)?  If you are physically unable to care for yourself, who would you call on to help you?  Celesta Gentile helps w these needs - Support system:  What does your support system look like?  Who would you call on if you needed some kind of practical help?  What if you needed someone to talk to for emotional support?  Lives w Celesta Gentile, family lives in [place]. Has friend that he grew up with who lives nearby  - Finances:  Are you concerned about finances.  Considering returning to work?  If not, applying for disability?  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  Have FMLA, does not have paperwork yet.  Unsure about short and long term disability.  "Kind of concerned about finances, I pay my own bills, cannot pay now that I am not working."  Algeria lives w him and split bills - now is all her.    What is your understanding of where you are with your cancer? Its cause?  Your treatment plan and what happens next?  Diagnosis of cancer came as a big surprise.  Next steps in treatment are chemotherapy, starting soon.  Is in treatment planning phase.  "Right now, I am just dealing with it, there is so much going on, I don't think I have really taken it all in."    What are your worries for the future as you begin treatment for cancer? Finances.  "I try not to think too far ahead, worry doesn't do any good."  What are your hopes and priorities during your  treatment? What is important to you? What are your goals for your care?  Lacks energy to do much of anything - used to like to play sports like basketball.  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  Has not connected w his employer re FMLA or short/long term disability benefits.  Wants to apply for government disability as he is too fatigued to work at this time.  Will refer to [facility] after asking oncologist if all results are in chart to document diagnosis and stage of cancer.  Has support from fiancee and friend in the area - family lives in [place].  Was surprised by diagnosis of cancer, but "I try not to worry,"  Has not thought about long term concerns, is currently consumed by multiple appointments and tests.  "I try to keep a positive attitude and just keep going."  No concerns re access to food, transportation, help at home or living expenses.  However, patient is out of work and has lost significant income  since diagnosis.  Is usually active in outdoor sports.    Identifications of barriers to care:  Lack of insurance, lack of income  Availability of community resources:  [place],   Clinical Social Worker follow up needed: Yes.    Continue to follow to assess and modify plan as needed  Edwyna Shell, [place] Worker Phone:  [phone] Cell:  [phone]

## 2019-01-11 NOTE — Progress Notes (Signed)
Met with Gabriel [name] who is a  65 YOM with newly diagnosed inoperable pancreatic mass  during new patient appointment with Dr. Maylon Peppers to assess navigation needs and barriers to tx. 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 He has experienced a significant weight and is down to 89 lbs at 5'8". He reports spending most time in bed. Patient has supportive mother and fiance who will transport patient back and forth to appointments if possible. He is interested in the transportation program. I will make referral. Patient is uninsured but states that he has application for orange card. I submitted an application to [employer] for PAP to obtain Creon. He will benefit from a social work referral as well as an appointment with financial advocate to assist with financial barriers.  Provided written information from the [facility] and  reviewed treatment team members and contact information. I was able to speak with both his Mother and his fiance to explain details about treatment plan. Patient has my direct contact information for questions.

## 2019-01-11 NOTE — Progress Notes (Signed)
[place] CSW Progress Notes  Call to patient to explain resources emailed to him - provided online link to apply for  Medicaid and [employer], spoke w his PCP (Ehrenfeld) and asked that he be assisted in applying for [facility] discount.  Appt made for this, patient to be given paper application at [facility] appointment at [facility].    Edwyna Shell, LCSW Clinical Social Worker Phone:  [phone] Cell:  [phone]

## 2019-01-11 NOTE — Telephone Encounter (Signed)
Spoke with patient to advise that all of his appointments discussed at initial consult on 10/7  have been scheduled. We reviewed appointments. No questions voiced. Encouraged him to reach out to me with questions or concerns. Patient appreciative of call.

## 2019-01-12 ENCOUNTER — Telehealth: Payer: Self-pay | Admitting: Genetic Counselor

## 2019-01-12 ENCOUNTER — Encounter: Payer: Self-pay | Admitting: Genetic Counselor

## 2019-01-12 NOTE — Telephone Encounter (Signed)
Disregard the last note, this will be considered a walk-in visit.

## 2019-01-12 NOTE — Telephone Encounter (Signed)
Called patient regarding upcoming [facility] appointment, voicemail is not set up. This will be considered a virtual visit.

## 2019-01-14 ENCOUNTER — Other Ambulatory Visit: Payer: Self-pay | Admitting: Radiology

## 2019-01-15 ENCOUNTER — Encounter: Payer: Self-pay | Admitting: Genetic Counselor

## 2019-01-15 ENCOUNTER — Inpatient Hospital Stay: Payer: Medicaid Other | Admitting: Nutrition

## 2019-01-15 ENCOUNTER — Inpatient Hospital Stay (HOSPITAL_BASED_OUTPATIENT_CLINIC_OR_DEPARTMENT_OTHER): Payer: Medicaid Other | Admitting: Genetic Counselor

## 2019-01-15 ENCOUNTER — Other Ambulatory Visit: Payer: Self-pay

## 2019-01-15 DIAGNOSIS — Z8042 Family history of malignant neoplasm of prostate: Secondary | ICD-10-CM

## 2019-01-15 DIAGNOSIS — C259 Malignant neoplasm of pancreas, unspecified: Secondary | ICD-10-CM

## 2019-01-15 DIAGNOSIS — Z803 Family history of malignant neoplasm of breast: Secondary | ICD-10-CM

## 2019-01-15 DIAGNOSIS — Z1379 Encounter for other screening for genetic and chromosomal anomalies: Secondary | ICD-10-CM

## 2019-01-15 NOTE — Progress Notes (Signed)
REFERRING PROVIDER: Tish Men, MD [place],  [place] [address]  PRIMARY PROVIDER:  Kerin Perna, NP  PRIMARY REASON FOR VISIT:  1. Pancreatic adenocarcinoma ([place])   2. Family history of breast cancer   3. Family history of prostate cancer      HISTORY OF PRESENT ILLNESS:   Mr. Lezcano, a 38 y.o. male, was seen for a [facility] cancer genetics consultation at the request of Dr. Maylon Peppers due to a personal and family history of cancer.  Mr. Rogerson presents to clinic today to discuss the possibility of a hereditary predisposition to cancer, genetic testing, and to further clarify his future cancer risks, as well as potential cancer risks for family members.   In September 2020, at the age of 49, Mr. Ciolek was diagnosed with cancer of the pancreas. The treatment plan includes possible chemotherapy.    CANCER HISTORY:  Oncology History  Pancreatic adenocarcinoma [facility])   Initial Diagnosis   Pancreatic adenocarcinoma ([place])   12/15/2018 Imaging   CT abdomen/pelvis w/ contrast: IMPRESSION: 1. Suggestion of a 1.5 cm mass within the mid aspect of the pancreatic body with associated atrophy of the upstream pancreatic tail and associated pancreatic ductal dilatation. Recommend further evaluation with MRI. There is soft tissue fullness around the superior mesenteric artery and celiac axis. This may represent edema or potentially tumor involvement. 2. Tree-in-bud nodularity within the peripheral aspect of the lower lobes bilaterally which may be secondary to an infectious or inflammatory process. Recommend follow-up chest CT in 3 months to assess for interval change. 3. These results will be called to the ordering clinician or representative by the Radiologist Assistant, and communication documented in the PACS or zVision Dashboard.   12/29/2018 Procedure   EUS: -2.4 x 1.9cm mass in the body of the pancreas causing the main pancreatic duct obstruction and clearly  involving the celiac trunk. Biopsied. -Heterogenous soft tissue mass in the neck, measuring 3.6cm maximally. This was not sampled, presumed to be a large thyroid gland.  -No peripancreatic adenopathy -CBD was normal, non-dilated.   12/29/2018 Pathology Results   CASE: [id number]   DIAGNOSIS:  - Malignant cells consistent with adenocarcinoma  - See comment.   01/24/2019 -  Chemotherapy   The patient had PACLitaxel-protein bound (ABRAXANE) chemo infusion 150 mg, 100 mg/m2 = 150 mg (original dose ), Intravenous,  Once, 0 of 4 cycles Dose modification: 100 mg/m2 (Cycle 1, Reason: Provider Judgment) gemcitabine (GEMZAR) 1,102 mg in sodium chloride 0.9 % 250 mL chemo infusion, 800 mg/m2 = 1,102 mg (original dose ), Intravenous,  Once, 0 of 4 cycles Dose modification: 800 mg/m2 (Cycle 1, Reason: Provider Judgment)  for chemotherapy treatment.       Past Medical History:  Diagnosis Date  . Family history of breast cancer   . Family history of prostate cancer   . Pancreatic mass     Past Surgical History:  Procedure Laterality Date  . ESOPHAGOGASTRODUODENOSCOPY (EGD) WITH PROPOFOL N/A 12/28/2018   Procedure: ESOPHAGOGASTRODUODENOSCOPY (EGD) WITH PROPOFOL;  Surgeon: Milus Banister, MD;  Location: [facility] ENDOSCOPY;  Service: Endoscopy;  Laterality: N/A;  . EUS N/A 12/28/2018   Procedure: UPPER ENDOSCOPIC ULTRASOUND (EUS) RADIAL;  Surgeon: Milus Banister, MD;  Location: [facility] ENDOSCOPY;  Service: Endoscopy;  Laterality: N/A;  . FINE NEEDLE ASPIRATION BIOPSY  12/28/2018   Procedure: FINE NEEDLE ASPIRATION BIOPSY;  Surgeon: Milus Banister, MD;  Location: [facility] ENDOSCOPY;  Service: Endoscopy;;  . none      Social  History   Socioeconomic History  . Marital status: Single    Spouse name: Not on file  . Number of children: Not on file  . Years of education: Not on file  . Highest education level: Not on file  Occupational History  . Not on file  Social Needs  . Financial resource strain:  Not on file  . Food insecurity    Worry: Not on file    Inability: Not on file  . Transportation needs    Medical: Not on file    Non-medical: Not on file  Tobacco Use  . Smoking status: Current Every Day Smoker    Types: E-cigarettes  . Smokeless tobacco: Never Used  Substance and Sexual Activity  . Alcohol use: No  . Drug use: No  . Sexual activity: Not on file  Lifestyle  . Physical activity    Days per week: Not on file    Minutes per session: Not on file  . Stress: Not on file  Relationships  . Social Herbalist on phone: Not on file    Gets together: Not on file    Attends religious service: Not on file    Active member of club or organization: Not on file    Attends meetings of clubs or organizations: Not on file    Relationship status: Not on file  Other Topics Concern  . Not on file  Social History Narrative  . Not on file     FAMILY HISTORY:  We obtained a detailed, 4-generation family history.  Significant diagnoses are listed below: Family History  Problem Relation Age of Onset  . Breast cancer Mother 71       second cancer at 52  . Prostate cancer Maternal Grandfather        dx in his 91s  . Esophageal cancer Neg Hx   . Colon cancer Neg Hx   . Pancreatic cancer Neg Hx   . Liver disease Neg Hx   . Stomach cancer Neg Hx     The patient has three daughters who are cancer free.  He has two full brothers who are cancer free. Both parents are living.  The patient's mother had breast cancer at 75 and again in her 38's.  She had a mastectomy at her latest dx.  It is unclear if she had genetic testing.  She has approximately 9 siblings, the patient is not aware of cancer in any of his mother's siblings or their children.  The maternal grandparents are deceased.  The grandfather may have had prostate cancer.  The patient's father is living at 53.  He had two brothers, one has died and the patient is not sure why.  There is no reported cancer on the  paternal side of the family.  Mr. Kielbasa is unaware of previous family history of genetic testing for hereditary cancer risks. Patient's maternal ancestors are of African American descent, and paternal ancestors are of African American descent. There is no reported Ashkenazi Jewish ancestry. There is no known consanguinity.  GENETIC COUNSELING ASSESSMENT: Mr. Weatherall is a 38 y.o. male with a personal and family history of cancer which is somewhat suggestive of a hereditary cancer syndrome and predisposition to cancer given his young age of onset and the family history of cancer. We, therefore, discussed and recommended the following at today's visit.   DISCUSSION: We discussed that 5 - 10% of pancreatic cancer is hereditary, with most cases associated with BRCA mutations.  There are other genes that can be associated with hereditary pancreatic cancer syndromes.  These include PALB2, ATM and others.  We discussed that testing is beneficial for several reasons including knowing how to follow individuals after completing their treatment, identifying whether potential treatment options such as PARP inhibitors would be beneficial, and understand if other family members could be at risk for cancer and allow them to undergo genetic testing.   We reviewed the characteristics, features and inheritance patterns of hereditary cancer syndromes. We also discussed genetic testing, including the appropriate family members to test, the process of testing, insurance coverage and turn-around-time for results. We discussed the implications of a negative, positive and/or variant of uncertain significant result. In order to get genetic test results in a timely manner so that Mr. Berrios can use these genetic test results for surgical decisions, we recommended Mr. Langenbach pursue genetic testing for the 9-gene STAT panel. Once complete, we recommend Mr. Morawski pursue reflex genetic testing to the common hereditary gene  panel. The Common Hereditary Gene Panel offered by [facility] includes sequencing and/or deletion duplication testing of the following 48 genes: APC, ATM, AXIN2, BARD1, BMPR1A, BRCA1, BRCA2, BRIP1, CDH1, CDK4, CDKN2A (p14ARF), CDKN2A (p16INK4a), CHEK2, CTNNA1, DICER1, EPCAM (Deletion/duplication testing only), GREM1 (promoter region deletion/duplication testing only), KIT, MEN1, MLH1, MSH2, MSH3, MSH6, MUTYH, NBN, NF1, NHTL1, PALB2, PDGFRA, PMS2, POLD1, POLE, PTEN, RAD50, RAD51C, RAD51D, RNF43, SDHB, SDHC, SDHD, SMAD4, SMARCA4. STK11, TP53, TSC1, TSC2, and VHL.  The following genes were evaluated for sequence changes only: SDHA and HOXB13 c.251G>A variant only.   Based on Mr. Jasperson's personal and family history of cancer, he meets medical criteria for genetic testing. Despite that he meets criteria, he may still have an out of pocket cost.   PLAN: Despite our recommendation, Mr. Nevel did not wish to pursue genetic testing at today's visit. We understand this decision and remain available to coordinate genetic testing at any time in the future. We encouraged Mr. Kinnan to ask his mother if she ever had genetic testing as that could help inform us on the chance that he has a gene mutation that could allow for systemic therapy.  Mr. Lowrimore questions were answered to his satisfaction today. Our contact information was provided should additional questions or concerns arise. Thank you for the referral and allowing [place] to share in the care of your patient.   Shell Yandow P. Florene Glen, [place], [facility] Licensed, Insurance risk surveyor [email or web address] phone: [phone]  The patient was seen for a total of 25 minutes in face-to-face genetic counseling.  This patient was discussed with Drs. Magrinat, Lindi Adie and/or Burr Medico who agrees with the above.    _______________________________________________________________________ For Office Staff:  Number of people involved in session: 1 Was an Intern/  student involved with case: no

## 2019-01-15 NOTE — Progress Notes (Signed)
Telephone visit completed with patient. 38 year old male diagnosed with pancreas cancer. He is a patient of Dr. Maylon Peppers.  PMH not significant.  Medications: Creon but patient has not picked up his prescription.  Labs: Glucose 107 on October 7.  Height: 5'8". Weight: 92.2 pounds September 17. UBW: 145 pounds per patient.  BMI: 14.02  Patient reports he has lost a lot of weight. States he just couldn't eat. Reports appetite improved some over the weekend. He has constipation and takes a stool softener. States he was prescribed Creon but hasn't picked up the prescription.  Nutrition Diagnosis: Underweight related to new pancreas cancer and and inadequate oral intake as evidenced by BMI of 14.02.  Intervention: Patient educated to consume smaller more frequent meals and snacks with high calorie, high protein foods. Recommend patient consume Ensure Enlive 3 times daily between meals.  Provided 1 complementary case. Educated patient on high protein foods. Educated patient on strategies for improving constipation. Provided fact sheets.  Questions were answered.  Teach back method used.  Contact information provided.  Monitoring, evaluation, goals: Patient will tolerate increased calories and protein to improve lean body mass.  Next visit: Wednesday, October 21 during infusion.  **Disclaimer: This note was dictated with voice recognition software. Similar sounding words can inadvertently be transcribed and this note may contain transcription errors which may not have been corrected upon publication of note.**

## 2019-01-16 ENCOUNTER — Encounter (HOSPITAL_COMMUNITY): Payer: Self-pay

## 2019-01-16 ENCOUNTER — Ambulatory Visit (HOSPITAL_COMMUNITY)
Admission: RE | Admit: 2019-01-16 | Discharge: 2019-01-16 | Disposition: A | Discharge status: Home / Self Care | Payer: Medicaid Other | Source: Ambulatory Visit | Attending: Hematology | Admitting: Hematology

## 2019-01-16 ENCOUNTER — Other Ambulatory Visit: Payer: Self-pay

## 2019-01-16 DIAGNOSIS — C251 Malignant neoplasm of body of pancreas: Secondary | ICD-10-CM | POA: Insufficient documentation

## 2019-01-16 DIAGNOSIS — F17210 Nicotine dependence, cigarettes, uncomplicated: Secondary | ICD-10-CM | POA: Insufficient documentation

## 2019-01-16 DIAGNOSIS — C259 Malignant neoplasm of pancreas, unspecified: Secondary | ICD-10-CM

## 2019-01-16 HISTORY — PX: IR IMAGING GUIDED PORT INSERTION: IMG5740

## 2019-01-16 LAB — CBC WITH DIFFERENTIAL/PLATELET
Abs Immature Granulocytes: 0.01 10*3/uL (ref 0.00–0.07)
Basophils Absolute: 0 10*3/uL (ref 0.0–0.1)
Basophils Relative: 1 %
Eosinophils Absolute: 0 10*3/uL (ref 0.0–0.5)
Eosinophils Relative: 1 %
HCT: 41.9 % (ref 39.0–52.0)
Hemoglobin: 13.9 g/dL (ref 13.0–17.0)
Immature Granulocytes: 0 %
Lymphocytes Relative: 24 %
Lymphs Abs: 1.3 10*3/uL (ref 0.7–4.0)
MCH: 29.8 pg (ref 26.0–34.0)
MCHC: 33.2 g/dL (ref 30.0–36.0)
MCV: 89.9 fL (ref 80.0–100.0)
Monocytes Absolute: 0.3 10*3/uL (ref 0.1–1.0)
Monocytes Relative: 6 %
Neutro Abs: 3.7 10*3/uL (ref 1.7–7.7)
Neutrophils Relative %: 68 %
Platelets: 241 10*3/uL (ref 150–400)
RBC: 4.66 MIL/uL (ref 4.22–5.81)
RDW: 13.9 % (ref 11.5–15.5)
WBC: 5.3 10*3/uL (ref 4.0–10.5)
nRBC: 0 % (ref 0.0–0.2)

## 2019-01-16 LAB — PROTIME-INR
INR: 0.9 (ref 0.8–1.2)
Prothrombin Time: 11.7 seconds (ref 11.4–15.2)

## 2019-01-16 MED ORDER — HEPARIN SOD (PORK) LOCK FLUSH 100 UNIT/ML IV SOLN
INTRAVENOUS | Status: AC
  Filled 2019-01-16: qty 5

## 2019-01-16 MED ORDER — MIDAZOLAM HCL 2 MG/2ML IJ SOLN
INTRAMUSCULAR | Status: AC
  Filled 2019-01-16: qty 4

## 2019-01-16 MED ORDER — SODIUM CHLORIDE 0.9 % IV SOLN
INTRAVENOUS | Status: DC
  Administered 2019-01-16: 13:00:00 via INTRAVENOUS

## 2019-01-16 MED ORDER — LIDOCAINE-EPINEPHRINE 1 %-1:100000 IJ SOLN
INTRAMUSCULAR | Status: AC | PRN
  Administered 2019-01-16: 10 mL

## 2019-01-16 MED ORDER — LIDOCAINE HCL (PF) 1 % IJ SOLN
INTRAMUSCULAR | Status: AC | PRN
  Administered 2019-01-16: 5 mL

## 2019-01-16 MED ORDER — CLINDAMYCIN PHOSPHATE 900 MG/50ML IV SOLN
INTRAVENOUS | Status: AC
  Administered 2019-01-16: 14:00:00 900 mg via INTRAVENOUS
  Filled 2019-01-16: qty 50

## 2019-01-16 MED ORDER — FENTANYL CITRATE (PF) 100 MCG/2ML IJ SOLN
INTRAMUSCULAR | Status: AC
  Filled 2019-01-16: qty 2

## 2019-01-16 MED ORDER — LIDOCAINE-EPINEPHRINE 1 %-1:100000 IJ SOLN
INTRAMUSCULAR | Status: AC
  Filled 2019-01-16: qty 1

## 2019-01-16 MED ORDER — HEPARIN SOD (PORK) LOCK FLUSH 100 UNIT/ML IV SOLN
INTRAVENOUS | Status: AC | PRN
  Administered 2019-01-16: 500 [IU] via INTRAVENOUS

## 2019-01-16 MED ORDER — MIDAZOLAM HCL 2 MG/2ML IJ SOLN
INTRAMUSCULAR | Status: AC | PRN
  Administered 2019-01-16 (×4): 1 mg via INTRAVENOUS

## 2019-01-16 MED ORDER — FENTANYL CITRATE (PF) 100 MCG/2ML IJ SOLN
INTRAMUSCULAR | Status: AC | PRN
  Administered 2019-01-16 (×2): 50 ug via INTRAVENOUS

## 2019-01-16 MED ORDER — CLINDAMYCIN PHOSPHATE 900 MG/50ML IV SOLN
900.0000 mg | Freq: Once | INTRAVENOUS | Status: AC
  Administered 2019-01-16: 14:00:00 900 mg via INTRAVENOUS

## 2019-01-16 MED ORDER — LIDOCAINE HCL 1 % IJ SOLN
INTRAMUSCULAR | Status: AC
  Filled 2019-01-16: qty 20

## 2019-01-16 NOTE — Progress Notes (Signed)
Jaraad Fugitt was seen and treated in our Interventional Radiology  department on 01/16/2019. He a procedure done which required Moderate Sedation. He will be out of work for ongoing medical treatment.

## 2019-01-16 NOTE — Discharge Instructions (Signed)
Do not use EMLA cream on your new port until your port has healed. The petroleum in the EMLA cream will dissolve the skin glue and the incision will come apart resulting in an infection. Use ice in a zip lock bag for 3-4 minutes before the nurse access your port.    Implanted Port Insertion, Care After This sheet gives you information about how to care for yourself after your procedure. Your health care provider may also give you more specific instructions. If you have problems or questions, contact your health care provider. What can I expect after the procedure? After the procedure, it is common to have:  Discomfort at the port insertion site.  Bruising on the skin over the port. This should improve over 3-4 days. Follow these instructions at home: [facility] care  After your port is placed, you will get a manufacturer's information card. The card has information about your port. Keep this card with you at all times.  Take care of the port as told by your health care provider. Ask your health care provider if you or a family member can get training for taking care of the port at home. A home health care nurse may also take care of the port.  Make sure to remember what type of port you have. Incision care      Follow instructions from your health care provider about how to take care of your port insertion site. Make sure you: ? Wash your hands with soap and water before and after you change your bandage (dressing). If soap and water are not available, use hand sanitizer. ? Change your dressing as told by your health care provider. ? Leave stitches (sutures), skin glue, or adhesive strips in place. These skin closures may need to stay in place for 2 weeks or longer. If adhesive strip edges start to loosen and curl up, you may trim the loose edges. Do not remove adhesive strips completely unless your health care provider tells you to do that.  Check your port insertion site every day for signs  of infection. Check for: ? Redness, swelling, or pain. ? Fluid or blood. ? Warmth. ? Pus or a bad smell. Activity  Return to your normal activities as told by your health care provider. Ask your health care provider what activities are safe for you.  Do not lift anything that is heavier than 10 lb (4.5 kg), or the limit that you are told, until your health care provider says that it is safe. General instructions  Take over-the-counter and prescription medicines only as told by your health care provider.  Do not take baths, swim, or use a hot tub until your health care provider approves. Ask your health care provider if you may take showers. You may only be allowed to take sponge baths.  Do not drive for 24 hours if you were given a sedative during your procedure.  Wear a medical alert bracelet in case of an emergency. This will tell any health care providers that you have a port.  Keep all follow-up visits as told by your health care provider. This is important. Contact a health care provider if:  You cannot flush your port with saline as directed, or you cannot draw blood from the port.  You have a fever or chills.  You have redness, swelling, or pain around your port insertion site.  You have fluid or blood coming from your port insertion site.  Your port insertion site feels warm  to the touch.  You have pus or a bad smell coming from the port insertion site. Get help right away if:  You have chest pain or shortness of breath.  You have bleeding from your port that you cannot control. Summary  Take care of the port as told by your health care provider. Keep the manufacturer's information card with you at all times.  Change your dressing as told by your health care provider.  Contact a health care provider if you have a fever or chills or if you have redness, swelling, or pain around your port insertion site.  Keep all follow-up visits as told by your health care  provider. This information is not intended to replace advice given to you by your health care provider. Make sure you discuss any questions you have with your health care provider. Document Released: 01/10/2013 Document Revised: 10/18/2017 Document Reviewed: 10/18/2017 Elsevier Patient Education  [place].    Moderate Conscious Sedation, Adult, Care After These instructions provide you with information about caring for yourself after your procedure. Your health care provider may also give you more specific instructions. Your treatment has been planned according to current medical practices, but problems sometimes occur. Call your health care provider if you have any problems or questions after your procedure. What can I expect after the procedure? After your procedure, it is common:  To feel sleepy for several hours.  To feel clumsy and have poor balance for several hours.  To have poor judgment for several hours.  To vomit if you eat too soon. Follow these instructions at home: For at least 24 hours after the procedure:   Do not: ? Participate in activities where you could fall or become injured. ? Drive. ? Use heavy machinery. ? Drink alcohol. ? Take sleeping pills or medicines that cause drowsiness. ? Make important decisions or sign legal documents. ? Take care of children on your own.  Rest. Eating and drinking  Follow the diet recommended by your health care provider.  If you vomit: ? Drink water, juice, or soup when you can drink without vomiting. ? Make sure you have little or no nausea before eating solid foods. General instructions  Have a responsible adult stay with you until you are awake and alert.  Take over-the-counter and prescription medicines only as told by your health care provider.  If you smoke, do not smoke without supervision.  Keep all follow-up visits as told by your health care provider. This is important. Contact a health care  provider if:  You keep feeling nauseous or you keep vomiting.  You feel light-headed.  You develop a rash.  You have a fever. Get help right away if:  You have trouble breathing. This information is not intended to replace advice given to you by your health care provider. Make sure you discuss any questions you have with your health care provider. Document Released: 01/10/2013 Document Revised: 03/04/2017 Document Reviewed: 07/12/2015 Elsevier Patient Education  2020 [employer].

## 2019-01-16 NOTE — Procedures (Signed)
Interventional Radiology Procedure:   Indications: Pancreatic cancer  Procedure: Port placement  Findings: Right jugular port, tip at SVC/RA junction  Complications: None     EBL: Minimal, less than 10 ml  Plan: Discharge in one hour.  Keep port site and incisions dry for at least 24 hours.     Garrett Mitchum R. Leshonda Galambos, MD  Pager: [phone]   

## 2019-01-16 NOTE — H&P (Signed)
Chief Complaint: Patient was seen in consultation today for port placement.  Referring Physician(s): Zhao,Yan  Supervising Physician: Markus Daft  Patient Status: [facility] - Out-pt  History of Present Illness: Gabriel [name] is a 38 y.o. male with a past medical history significant for pancreatic cancer followed by Dr. Maylon Peppers who presents today for port placement. Briefly, Gabriel [name] began to experience unexplained weight loss, abdominal pain and poor appetite beginning around May of this year - he presented to the ED on 11/29/18 due to worsening symptoms. CT abd/pelvis at that time did not show any abnormalities. He was seen by his PCP for follow up on 9/10 and a repeat CT abd/pelvis was ordered which showed suggestion of a 1.5 cm mass within the mid aspect of the pancreatic body with associated atrophy of the upstream pancreatic tail and associated pancreatic ductal dilatation. He was referred to GI and underwent EUS on 9/24 which showed a ~2.5 cm pancreatic body mass, biopsy showed adenocarcinoma. 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   Patient denies any complaints currently except fatigue, he states his pain is ok because he took a morphine pill around 6 am this morning. He has been eating and drinking as much as he can because he does not want to have to have a feeding tube. He is not sure when he will begin chemotherapy. He states understanding of requested procedure and wishes to proceed.  Past Medical History:  Diagnosis Date  . Family history of breast cancer   . Family history of prostate cancer   . Pancreatic mass     Past Surgical History:  Procedure Laterality Date  . ESOPHAGOGASTRODUODENOSCOPY (EGD) WITH PROPOFOL N/A 12/28/2018   Procedure: ESOPHAGOGASTRODUODENOSCOPY (EGD) WITH PROPOFOL;  Surgeon: Milus Banister, MD;  Location: [facility] ENDOSCOPY;  Service:  Endoscopy;  Laterality: N/A;  . EUS N/A 12/28/2018   Procedure: UPPER ENDOSCOPIC ULTRASOUND (EUS) RADIAL;  Surgeon: Milus Banister, MD;  Location: [facility] ENDOSCOPY;  Service: Endoscopy;  Laterality: N/A;  . FINE NEEDLE ASPIRATION BIOPSY  12/28/2018   Procedure: FINE NEEDLE ASPIRATION BIOPSY;  Surgeon: Milus Banister, MD;  Location: [facility] ENDOSCOPY;  Service: Endoscopy;;  . none      Allergies: Penicillins  Medications: Prior to Admission medications   Medication Sig Start Date End Date Taking? Authorizing Provider  cyclobenzaprine (FLEXERIL) 10 MG tablet Take 10 mg by mouth 3 (three) times daily as needed for muscle spasms.   Yes [provider]  diphenhydramine-acetaminophen (TYLENOL PM) 25-500 MG TABS tablet Take 2 tablets by mouth at bedtime as needed (sleep).   Yes [provider]  lipase/protease/amylase (CREON) 36000 UNITS CPEP capsule Take 1 capsule (36,000 Units total) by mouth 3 (three) times daily with meals. 01/10/19 02/09/19 Yes Tish Men, MD  morphine (MS CONTIN) 15 MG 12 hr tablet Take 1 tablet (15 mg total) by mouth every 12 (twelve) hours. 01/10/19 02/09/19 Yes Tish Men, MD  morphine (MSIR) 15 MG tablet Take 1 tablet (15 mg total) by mouth every 6 (six) hours as needed for severe pain. 01/10/19  Yes Tish Men, MD  oxyCODONE-acetaminophen (PERCOCET/ROXICET) 5-325 MG tablet Take 1 tablet by mouth every 4 (four) hours as needed for severe pain. 12/21/18  Yes Armbruster, Carlota Raspberry, MD     Family History  Problem Relation Age of Onset  . Breast cancer Mother 62  second cancer at 68  . Prostate cancer Maternal Grandfather        dx in his 37s  . Esophageal cancer Neg Hx   . Colon cancer Neg Hx   . Pancreatic cancer Neg Hx   . Liver disease Neg Hx   . Stomach cancer Neg Hx     Social History   Socioeconomic History  . Marital status: Single    Spouse name: Not on file  . Number of children: Not on file  . Years of education: Not on file  . Highest  education level: Not on file  Occupational History  . Not on file  Social Needs  . Financial resource strain: Not on file  . Food insecurity    Worry: Not on file    Inability: Not on file  . Transportation needs    Medical: Not on file    Non-medical: Not on file  Tobacco Use  . Smoking status: Current Every Day Smoker    Types: E-cigarettes  . Smokeless tobacco: Never Used  Substance and Sexual Activity  . Alcohol use: No  . Drug use: No  . Sexual activity: Not on file  Lifestyle  . Physical activity    Days per week: Not on file    Minutes per session: Not on file  . Stress: Not on file  Relationships  . Social Herbalist on phone: Not on file    Gets together: Not on file    Attends religious service: Not on file    Active member of club or organization: Not on file    Attends meetings of clubs or organizations: Not on file    Relationship status: Not on file  Other Topics Concern  . Not on file  Social History Narrative  . Not on file     Review of Systems: A 12 point ROS discussed and pertinent positives are indicated in the HPI above.  All other systems are negative.  Review of Systems  Constitutional: Positive for appetite change and fatigue. Negative for chills and fever.  HENT: Negative for nosebleeds.   Respiratory: Negative for cough and shortness of breath.   Gastrointestinal: Positive for abdominal pain (None currently 2/2 morphine earlier). Negative for blood in stool, diarrhea, nausea and vomiting.  Genitourinary: Negative for dysuria and hematuria.  Musculoskeletal: Negative for back pain.  Skin: Negative for rash.  Neurological: Negative for dizziness and headaches.    Vital Signs: Ht 5\' 8"  (1.727 m)   Wt 90 lb (40.8 kg)   BMI 13.68 kg/m   Physical Exam Vitals signs reviewed.  Constitutional:      General: He is not in acute distress.    Appearance: He is ill-appearing.  HENT:     Mouth/Throat:     Mouth: Mucous membranes  are moist.     Pharynx: Oropharynx is clear. No oropharyngeal exudate or posterior oropharyngeal erythema.  Eyes:     General: No scleral icterus. Cardiovascular:     Rate and Rhythm: Normal rate and regular rhythm.  Pulmonary:     Effort: Pulmonary effort is normal.     Breath sounds: Normal breath sounds.  Abdominal:     General: There is no distension.     Palpations: Abdomen is soft.     Tenderness: There is no abdominal tenderness.  Skin:    General: Skin is warm and dry.     Coloration: Skin is not jaundiced.  Neurological:     Mental  Status: He is alert and oriented to person, place, and time.  Psychiatric:        Mood and Affect: Mood normal.        Behavior: Behavior normal.        Thought Content: Thought content normal.        Judgment: Judgment normal.      MD Evaluation Airway: WNL Heart: WNL Abdomen: WNL Chest/ Lungs: WNL ASA  Classification: 3 Mallampati/Airway Score: Two   Imaging: No results found.  Labs:  CBC: Recent Labs    11/29/18 1245 12/21/18 1629 01/10/19 1012 01/16/19 1234  WBC 4.3 5.2 4.7 5.3  HGB 16.0 16.3 15.9 13.9  HCT 48.2 49.2 46.6 41.9  PLT 251 270.0 271 241    COAGS: Recent Labs    01/16/19 1234  INR 0.9    BMP: Recent Labs    08/29/18 1918 12/21/18 1629 01/10/19 1012  NA 140 136 136  K 3.6 3.9 4.1  CL 101 97 101  CO2 27 33* 29  GLUCOSE 103* 105* 107*  BUN 7 9 10   CALCIUM 9.4 10.0 9.3  CREATININE 1.00 0.96 0.79  GFRNONAA >60  --  >60  GFRAA >60  --  >60    LIVER FUNCTION TESTS: Recent Labs    08/29/18 1918 12/21/18 1629 01/10/19 1012  BILITOT 0.7 0.4 0.4  AST 21 48* 73*  ALT 33 174* 158*  ALKPHOS 71 75 75  PROT 7.0 7.6 7.2  ALBUMIN 4.0 4.5 4.0    TUMOR MARKERS: Recent Labs    12/21/18 1629  CA199 120*    Assessment and Plan:  38 y/o M with recently diagnosed pancreatic adenocarcinoma followed by Dr. Maylon Peppers who plans to proceed with chemotherapy. IR has been asked to place a port so he  may begin chemotherapy.  Patient has been NPO since 6 am today, he took morphine ER x1 around 6 am with a small sip of water, he does not take blood thinning medications. Afebrile, WBC 5.3, hgb 13.9, plt 241, INR 0.9.  Risks and benefits of image guided port-a-catheter placement were discussed with the patient including, but not limited to bleeding, infection, pneumothorax, or fibrin sheath development and need for additional procedures.  All of the patient's questions were answered, patient is agreeable to proceed.  Consent signed and in chart.  Thank you for this interesting consult.  I greatly enjoyed meeting Gabriel [name] and look forward to participating in their care.  A copy of this report was sent to the requesting provider on this date.  Electronically Signed: Joaquim Nam, PA-C 01/16/2019, 1:00 PM   I spent a total of 15 Minutes   in face to face in clinical consultation, greater than 50% of which was counseling/coordinating care for port placement.

## 2019-01-17 ENCOUNTER — Encounter (HOSPITAL_COMMUNITY)
Admission: RE | Admit: 2019-01-17 | Discharge: 2019-01-17 | Disposition: A | Discharge status: Home / Self Care | Payer: Medicaid Other | Source: Ambulatory Visit | Attending: Hematology | Admitting: Hematology

## 2019-01-17 DIAGNOSIS — C259 Malignant neoplasm of pancreas, unspecified: Secondary | ICD-10-CM | POA: Diagnosis present

## 2019-01-17 LAB — GLUCOSE, CAPILLARY: Glucose-Capillary: 98 mg/dL (ref 70–99)

## 2019-01-17 MED ORDER — FLUDEOXYGLUCOSE F - 18 (FDG) INJECTION
6.0000 | Freq: Once | INTRAVENOUS | Status: AC
  Administered 2019-01-17: 6 via INTRAVENOUS

## 2019-01-23 ENCOUNTER — Other Ambulatory Visit: Payer: Self-pay | Admitting: Hematology

## 2019-01-23 ENCOUNTER — Inpatient Hospital Stay: Payer: Medicaid Other

## 2019-01-23 DIAGNOSIS — C259 Malignant neoplasm of pancreas, unspecified: Secondary | ICD-10-CM

## 2019-01-23 MED ORDER — LIDOCAINE-PRILOCAINE 2.5-2.5 % EX CREA: TOPICAL_CREAM | CUTANEOUS | 3 refills | Status: AC

## 2019-01-23 MED ORDER — ONDANSETRON HCL 8 MG PO TABS: 8.0000 mg | ORAL_TABLET | Freq: Two times a day (BID) | ORAL | 1 refills | Status: AC | PRN

## 2019-01-23 MED ORDER — PROCHLORPERAZINE MALEATE 10 MG PO TABS: 10.0000 mg | ORAL_TABLET | Freq: Four times a day (QID) | ORAL | 1 refills | Status: AC | PRN

## 2019-01-23 MED ORDER — LORAZEPAM 0.5 MG PO TABS: 0.5000 mg | ORAL_TABLET | Freq: Four times a day (QID) | ORAL | 0 refills | Status: AC | PRN

## 2019-01-24 ENCOUNTER — Inpatient Hospital Stay (HOSPITAL_BASED_OUTPATIENT_CLINIC_OR_DEPARTMENT_OTHER): Payer: Medicaid Other | Admitting: Hematology

## 2019-01-24 ENCOUNTER — Other Ambulatory Visit: Payer: Self-pay

## 2019-01-24 ENCOUNTER — Inpatient Hospital Stay: Payer: Medicaid Other

## 2019-01-24 ENCOUNTER — Encounter: Payer: Self-pay | Admitting: *Deleted

## 2019-01-24 ENCOUNTER — Inpatient Hospital Stay: Payer: Medicaid Other | Admitting: Nutrition

## 2019-01-24 ENCOUNTER — Encounter: Payer: Self-pay | Admitting: Hematology

## 2019-01-24 ENCOUNTER — Telehealth: Payer: Self-pay | Admitting: Hematology

## 2019-01-24 VITALS — BP 130/85 | HR 70 | Temp 98.9°F | Resp 16 | Ht 68.0 in | Wt 91.7 lb

## 2019-01-24 DIAGNOSIS — C259 Malignant neoplasm of pancreas, unspecified: Secondary | ICD-10-CM

## 2019-01-24 DIAGNOSIS — Z5111 Encounter for antineoplastic chemotherapy: Secondary | ICD-10-CM | POA: Diagnosis not present

## 2019-01-24 DIAGNOSIS — E46 Unspecified protein-calorie malnutrition: Secondary | ICD-10-CM | POA: Insufficient documentation

## 2019-01-24 DIAGNOSIS — G893 Neoplasm related pain (acute) (chronic): Secondary | ICD-10-CM

## 2019-01-24 DIAGNOSIS — Z809 Family history of malignant neoplasm, unspecified: Secondary | ICD-10-CM

## 2019-01-24 LAB — CBC WITH DIFFERENTIAL (CANCER CENTER ONLY)
Abs Immature Granulocytes: 0.01 10*3/uL (ref 0.00–0.07)
Basophils Absolute: 0 10*3/uL (ref 0.0–0.1)
Basophils Relative: 0 %
Eosinophils Absolute: 0 10*3/uL (ref 0.0–0.5)
Eosinophils Relative: 0 %
HCT: 40.4 % (ref 39.0–52.0)
Hemoglobin: 13.6 g/dL (ref 13.0–17.0)
Immature Granulocytes: 0 %
Lymphocytes Relative: 24 %
Lymphs Abs: 1.3 10*3/uL (ref 0.7–4.0)
MCH: 29.5 pg (ref 26.0–34.0)
MCHC: 33.7 g/dL (ref 30.0–36.0)
MCV: 87.6 fL (ref 80.0–100.0)
Monocytes Absolute: 0.4 10*3/uL (ref 0.1–1.0)
Monocytes Relative: 7 %
Neutro Abs: 3.7 10*3/uL (ref 1.7–7.7)
Neutrophils Relative %: 69 %
Platelet Count: 227 10*3/uL (ref 150–400)
RBC: 4.61 MIL/uL (ref 4.22–5.81)
RDW: 14.1 % (ref 11.5–15.5)
WBC Count: 5.3 10*3/uL (ref 4.0–10.5)
nRBC: 0 % (ref 0.0–0.2)

## 2019-01-24 LAB — CMP (CANCER CENTER ONLY)
ALT: 129 U/L — ABNORMAL HIGH (ref 0–44)
AST: 45 U/L — ABNORMAL HIGH (ref 15–41)
Albumin: 3.5 g/dL (ref 3.5–5.0)
Alkaline Phosphatase: 77 U/L (ref 38–126)
Anion gap: 8 (ref 5–15)
BUN: 9 mg/dL (ref 6–20)
CO2: 30 mmol/L (ref 22–32)
Calcium: 9.2 mg/dL (ref 8.9–10.3)
Chloride: 101 mmol/L (ref 98–111)
Creatinine: 0.74 mg/dL (ref 0.61–1.24)
GFR, Est AFR Am: >60 mL/min (ref >60)
GFR, Estimated: >60 mL/min (ref >60)
Glucose, Bld: 81 mg/dL (ref 70–99)
Potassium: 4 mmol/L (ref 3.5–5.1)
Sodium: 139 mmol/L (ref 135–145)
Total Bilirubin: 0.3 mg/dL (ref 0.3–1.2)
Total Protein: 6.7 g/dL (ref 6.5–8.1)

## 2019-01-24 MED ORDER — HEPARIN SOD (PORK) LOCK FLUSH 100 UNIT/ML IV SOLN
500.0000 [IU] | Freq: Once | INTRAVENOUS | Status: AC | PRN
  Administered 2019-01-24: 500 [IU]
  Filled 2019-01-24: qty 5

## 2019-01-24 MED ORDER — SODIUM CHLORIDE 0.9% FLUSH
10.0000 mL | INTRAVENOUS | Status: DC | PRN
  Administered 2019-01-24: 10 mL
  Filled 2019-01-24: qty 10

## 2019-01-24 MED ORDER — PROCHLORPERAZINE MALEATE 10 MG PO TABS
ORAL_TABLET | ORAL | Status: AC
  Filled 2019-01-24: qty 1

## 2019-01-24 MED ORDER — PACLITAXEL PROTEIN-BOUND CHEMO INJECTION 100 MG
100.0000 mg/m2 | Freq: Once | INTRAVENOUS | Status: AC
  Administered 2019-01-24: 150 mg via INTRAVENOUS
  Filled 2019-01-24: qty 30

## 2019-01-24 MED ORDER — PROCHLORPERAZINE MALEATE 10 MG PO TABS
10.0000 mg | ORAL_TABLET | Freq: Once | ORAL | Status: AC
  Administered 2019-01-24: 10 mg via ORAL

## 2019-01-24 MED ORDER — SODIUM CHLORIDE 0.9 % IV SOLN
800.0000 mg/m2 | Freq: Once | INTRAVENOUS | Status: AC
  Administered 2019-01-24: 1102 mg via INTRAVENOUS
  Filled 2019-01-24: qty 28.98

## 2019-01-24 MED ORDER — SODIUM CHLORIDE 0.9 % IV SOLN
Freq: Once | INTRAVENOUS | Status: AC
  Administered 2019-01-24: 14:00:00 via INTRAVENOUS
  Filled 2019-01-24: qty 250

## 2019-01-24 NOTE — Patient Instructions (Signed)
[facility] Discharge Instructions for Patients Receiving Chemotherapy  Today you received the following chemotherapy agents: Abraxane and Gemzar.  To help prevent nausea and vomiting after your treatment, we encourage you to take your nausea medication as directed.   If you develop nausea and vomiting that is not controlled by your nausea medication, call the clinic.   BELOW ARE SYMPTOMS THAT SHOULD BE REPORTED IMMEDIATELY:  *FEVER GREATER THAN 100.5 F  *CHILLS WITH OR WITHOUT FEVER  NAUSEA AND VOMITING THAT IS NOT CONTROLLED WITH YOUR NAUSEA MEDICATION  *UNUSUAL SHORTNESS OF BREATH  *UNUSUAL BRUISING OR BLEEDING  TENDERNESS IN MOUTH AND THROAT WITH OR WITHOUT PRESENCE OF ULCERS  *URINARY PROBLEMS  *BOWEL PROBLEMS  UNUSUAL RASH Items with * indicate a potential emergency and should be followed up as soon as possible.  Feel free to call the clinic should you have any questions or concerns. The clinic phone number is [phone].  Please show the [facility] at check-in to the Emergency Department and triage nurse.  Nanoparticle Albumin-Bound Paclitaxel injection What is this medicine? NANOPARTICLE ALBUMIN-BOUND PACLITAXEL (Na no PAHR ti kuhl al BYOO muhn-bound PAK li TAX el) is a chemotherapy drug. It targets fast dividing cells, like cancer cells, and causes these cells to die. This medicine is used to treat advanced breast cancer, lung cancer, and pancreatic cancer. This medicine may be used for other purposes; ask your health care provider or pharmacist if you have questions. COMMON BRAND NAME(S): Abraxane What should I tell my health care provider before I take this medicine? They need to know if you have any of these conditions:  kidney disease  liver disease  low blood counts, like low white cell, platelet, or red cell counts  lung or breathing disease, like asthma  tingling of the fingers or toes, or other nerve disorder  an unusual or  allergic reaction to paclitaxel, albumin, other chemotherapy, other medicines, foods, dyes, or preservatives  pregnant or trying to get pregnant  breast-feeding How should I use this medicine? This drug is given as an infusion into a vein. It is administered in a hospital or clinic by a specially trained health care professional. Talk to your pediatrician regarding the use of this medicine in children. Special care may be needed. Overdosage: If you think you have taken too much of this medicine contact a poison control center or emergency room at once. NOTE: This medicine is only for you. Do not share this medicine with others. What if I miss a dose? It is important not to miss your dose. Call your doctor or health care professional if you are unable to keep an appointment. What may interact with this medicine? This medicine may interact with the following medications:  antiviral medicines for hepatitis, HIV or AIDS  certain antibiotics like erythromycin and clarithromycin  certain medicines for fungal infections like ketoconazole and itraconazole  certain medicines for seizures like carbamazepine, phenobarbital, phenytoin  gemfibrozil  nefazodone  rifampin  St. John's wort This list may not describe all possible interactions. Give your health care provider a list of all the medicines, herbs, non-prescription drugs, or dietary supplements you use. Also tell them if you smoke, drink alcohol, or use illegal drugs. Some items may interact with your medicine. What should I watch for while using this medicine? Your condition will be monitored carefully while you are receiving this medicine. You will need important blood work done while you are taking this medicine. This medicine can cause  serious allergic reactions. If you experience allergic reactions like skin rash, itching or hives, swelling of the face, lips, or tongue, tell your doctor or health care professional right away. In some  cases, you may be given additional medicines to help with side effects. Follow all directions for their use. This drug may make you feel generally unwell. This is not uncommon, as chemotherapy can affect healthy cells as well as cancer cells. Report any side effects. Continue your course of treatment even though you feel ill unless your doctor tells you to stop. Call your doctor or health care professional for advice if you get a fever, chills or sore throat, or other symptoms of a cold or flu. Do not treat yourself. This drug decreases your body's ability to fight infections. Try to avoid being around people who are sick. This medicine may increase your risk to bruise or bleed. Call your doctor or health care professional if you notice any unusual bleeding. Be careful brushing and flossing your teeth or using a toothpick because you may get an infection or bleed more easily. If you have any dental work done, tell your dentist you are receiving this medicine. Avoid taking products that contain aspirin, acetaminophen, ibuprofen, naproxen, or ketoprofen unless instructed by your doctor. These medicines may hide a fever. Do not become pregnant while taking this medicine or for 6 months after stopping it. Women should inform their doctor if they wish to become pregnant or think they might be pregnant. Men should not father a child while taking this medicine or for 3 months after stopping it. There is a potential for serious side effects to an unborn child. Talk to your health care professional or pharmacist for more information. Do not breast-feed an infant while taking this medicine or for 2 weeks after stopping it. This medicine may interfere with the ability to get pregnant or to father a child. You should talk to your doctor or health care professional if you are concerned about your fertility. What side effects may I notice from receiving this medicine? Side effects that you should report to your doctor  or health care professional as soon as possible:  allergic reactions like skin rash, itching or hives, swelling of the face, lips, or tongue  breathing problems  changes in vision  fast, irregular heartbeat  low blood pressure  mouth sores  pain, tingling, numbness in the hands or feet  signs of decreased platelets or bleeding - bruising, pinpoint red spots on the skin, black, tarry stools, blood in the urine  signs of decreased red blood cells - unusually weak or tired, feeling faint or lightheaded, falls  signs of infection - fever or chills, cough, sore throat, pain or difficulty passing urine  signs and symptoms of liver injury like dark yellow or brown urine; general ill feeling or flu-like symptoms; light-colored stools; loss of appetite; nausea; right upper belly pain; unusually weak or tired; yellowing of the eyes or skin  swelling of the ankles, feet, hands  unusually slow heartbeat Side effects that usually do not require medical attention (report to your doctor or health care professional if they continue or are bothersome):  diarrhea  hair loss  loss of appetite  nausea, vomiting  tiredness This list may not describe all possible side effects. Call your doctor for medical advice about side effects. You may report side effects to FDA at 1-800-FDA-1088. Where should I keep my medicine? This drug is given in a hospital or clinic and  will not be stored at home. NOTE: This sheet is a summary. It may not cover all possible information. If you have questions about this medicine, talk to your doctor, pharmacist, or health care provider.  2020 Elsevier/Gold Standard (2016-11-23 13:03:45)  Gemcitabine injection What is this medicine? GEMCITABINE (jem SYE ta been) is a chemotherapy drug. This medicine is used to treat many types of cancer like breast cancer, lung cancer, pancreatic cancer, and ovarian cancer. This medicine may be used for other purposes; ask your  health care provider or pharmacist if you have questions. COMMON BRAND NAME(S): Gemzar, Infugem What should I tell my health care provider before I take this medicine? They need to know if you have any of these conditions:  blood disorders  infection  kidney disease  liver disease  lung or breathing disease, like asthma  recent or ongoing radiation therapy  an unusual or allergic reaction to gemcitabine, other chemotherapy, other medicines, foods, dyes, or preservatives  pregnant or trying to get pregnant  breast-feeding How should I use this medicine? This drug is given as an infusion into a vein. It is administered in a hospital or clinic by a specially trained health care professional. Talk to your pediatrician regarding the use of this medicine in children. Special care may be needed. Overdosage: If you think you have taken too much of this medicine contact a poison control center or emergency room at once. NOTE: This medicine is only for you. Do not share this medicine with others. What if I miss a dose? It is important not to miss your dose. Call your doctor or health care professional if you are unable to keep an appointment. What may interact with this medicine?  medicines to increase blood counts like filgrastim, pegfilgrastim, sargramostim  some other chemotherapy drugs like cisplatin  vaccines Talk to your doctor or health care professional before taking any of these medicines:  acetaminophen  aspirin  ibuprofen  ketoprofen  naproxen This list may not describe all possible interactions. Give your health care provider a list of all the medicines, herbs, non-prescription drugs, or dietary supplements you use. Also tell them if you smoke, drink alcohol, or use illegal drugs. Some items may interact with your medicine. What should I watch for while using this medicine? Visit your doctor for checks on your progress. This drug may make you feel generally unwell.  This is not uncommon, as chemotherapy can affect healthy cells as well as cancer cells. Report any side effects. Continue your course of treatment even though you feel ill unless your doctor tells you to stop. In some cases, you may be given additional medicines to help with side effects. Follow all directions for their use. Call your doctor or health care professional for advice if you get a fever, chills or sore throat, or other symptoms of a cold or flu. Do not treat yourself. This drug decreases your body's ability to fight infections. Try to avoid being around people who are sick. This medicine may increase your risk to bruise or bleed. Call your doctor or health care professional if you notice any unusual bleeding. Be careful brushing and flossing your teeth or using a toothpick because you may get an infection or bleed more easily. If you have any dental work done, tell your dentist you are receiving this medicine. Avoid taking products that contain aspirin, acetaminophen, ibuprofen, naproxen, or ketoprofen unless instructed by your doctor. These medicines may hide a fever. Do not become pregnant while taking   this medicine or for 6 months after stopping it. Women should inform their doctor if they wish to become pregnant or think they might be pregnant. Men should not father a child while taking this medicine and for 3 months after stopping it. There is a potential for serious side effects to an unborn child. Talk to your health care professional or pharmacist for more information. Do not breast-feed an infant while taking this medicine or for at least 1 week after stopping it. Men should inform their doctors if they wish to father a child. This medicine may lower sperm counts. Talk with your doctor or health care professional if you are concerned about your fertility. What side effects may I notice from receiving this medicine? Side effects that you should report to your doctor or health care  professional as soon as possible:  allergic reactions like skin rash, itching or hives, swelling of the face, lips, or tongue  breathing problems  pain, redness, or irritation at site where injected  signs and symptoms of a dangerous change in heartbeat or heart rhythm like chest pain; dizziness; fast or irregular heartbeat; palpitations; feeling faint or lightheaded, falls; breathing problems  signs of decreased platelets or bleeding - bruising, pinpoint red spots on the skin, black, tarry stools, blood in the urine  signs of decreased red blood cells - unusually weak or tired, feeling faint or lightheaded, falls  signs of infection - fever or chills, cough, sore throat, pain or difficulty passing urine  signs and symptoms of kidney injury like trouble passing urine or change in the amount of urine  signs and symptoms of liver injury like dark yellow or brown urine; general ill feeling or flu-like symptoms; light-colored stools; loss of appetite; nausea; right upper belly pain; unusually weak or tired; yellowing of the eyes or skin  swelling of ankles, feet, hands Side effects that usually do not require medical attention (report to your doctor or health care professional if they continue or are bothersome):  constipation  diarrhea  hair loss  loss of appetite  nausea  rash  vomiting This list may not describe all possible side effects. Call your doctor for medical advice about side effects. You may report side effects to FDA at 1-800-FDA-1088. Where should I keep my medicine? This drug is given in a hospital or clinic and will not be stored at home. NOTE: This sheet is a summary. It may not cover all possible information. If you have questions about this medicine, talk to your doctor, pharmacist, or health care provider.  2020 Elsevier/Gold Standard (2017-06-15 18:06:11)     

## 2019-01-24 NOTE — Progress Notes (Signed)
Nutrition follow-up completed with patient in the infusion room. Patient to receive chemotherapy for pancreas cancer. Weight is stable and was documented as 91.7 pounds today. Patient reports he is able to tolerate food a little better. He has been consuming 2-3 bottles of Ensure however he has decided he prefers boost plus. Patient is still not taking Creon.  Nutrition diagnosis: Underweight continues.  Intervention: Encourage patient to increase oral intake.  Recommended boost plus or equivalent 4 times daily. Educated on strategies for improving bowel regimen. Educated briefly on pancreatic enzymes and their function and encouraged him to fill his prescription. Teach back method used.  Monitoring, evaluation, goals: Patient will tolerate increased calories and protein to promote weight gain/improve lean body mass.  Next visit: Wednesday, November 4 during infusion.  **Disclaimer: This note was dictated with voice recognition software. Similar sounding words can inadvertently be transcribed and this note may contain transcription errors which may not have been corrected upon publication of note.**

## 2019-01-24 NOTE — Progress Notes (Signed)
Per Dr Maylon Peppers, request for [facility] sent on  Specimen [id number] Date of collection 12/28/2018  Faxed to [phone]

## 2019-01-24 NOTE — Progress Notes (Signed)
Met with patient to introduce myself as Financial Resource Specialist and to offer available resources. ° °I was able to obtain financial information from hospital advocate. Patient approved for one-time $700 [facility] grant to assist with personal expenses while going through treatment. Gave him a copy of the approval letter as well as the expense sheet along with the Outpatient pharmacy information. He received a gas card today from his grant. ° °He has my card for any additional financial questions or concerns.  °

## 2019-01-24 NOTE — Progress Notes (Signed)
Per Dr. Maylon Peppers, ok to treat with elevated LFTs. After gemcitabine infusion initiated, patient verbalized need to have a bowel movement. Stated he was having abdominal cramping. Verbalized partial relief after bowel movement. Patient requested no further intervention. Encouraged him to go to nearest ED if symptoms worsened. Also informed patient of [facility] if he felt he needed to be seen tomorrow. Verbalized understanding.

## 2019-01-24 NOTE — Progress Notes (Signed)
[place] OFFICE PROGRESS NOTE  Patient Care Team: Kerin Perna, NP as PCP - General (Internal Medicine)  HEME/ONC OVERVIEW: 1. 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  There were also mildly FDG avid periaortic LN's (max SUV 3.3).  There were scattered subcentimeter nodules in the bilateral lungs, favoring inflammatory.  There was no other evidence of definite metastatic disease. -I discussed imaging results in detail with the patient -I also reinforced the rationale for chemotherapy, as well as some of the potential toxicities.  I emphasized with the patient that the goal of treatment is for palliative intent only. -Labs adequate today, proceed with C1D1 of gemcitabine/Abraxane -I have also requested molecular studies on the pancreatic malignancy to assess for any targetable mutations -PRN anti-medics: Zofran, Compazine, and Ativan -We will plan to repeat scans after 2 cycles of treatment to assess interim response  Abdominal pain -Secondary to vascular and nerve involvement by pancreatic cancer  -Currently on MS-Contin  15mg  BID and IR morphine 15mg  q6hrs PRN for pain control  -Pain relatively well controlled -If he continues to use IR morphine several times a day at the next visit, we can increase the dose of MS-Contin  -In addition, we can also consider celiac nerve block in the future if pain remains poorly controlled despite high-dose pain medications   Diarrhea -Possibly due to pancreatic insufficiency -On Creon TID; patient has completed medication assistance program  -Nutrition following  Family history of malignancy -Patient was referred to genetics for evaluation of hereditary syndrome, given his family history of extensive malignancy -Unfortunately, he declined genetic evaluation -As he has several children of young age, I emphasized with the patient the importance of genetic testing, not only due to its potential impact on the patient's treatment options, but also cancer screening for his children -Patient expressed understanding and will think more about it  No orders of the defined types were placed in this encounter.  All questions were answered. The patient knows to call the clinic with any problems, questions or concerns. No barriers to learning was detected.  Return in 2 weeks for labs, port flush, clinic appt and C1D15 of gemcitabine/Abraxane.   Tish Men, MD 01/24/2019 2:08 PM  CHIEF COMPLAINT: "My pain is better"  INTERVAL HISTORY: [name] returns to clinic for follow-up of locally advanced pancreatic adenocarcinoma.  He reports that since starting MS-Contin and IR morphine, his pain has been much better.  He currently takes IR morphine approximately 2-3 times a day for breakthrough pain.  His pain is reasonably well controlled.  He denies any constipation from opioid medications.  He drinks 2-3 bottles of Ensure a day, and is also able to eat fruits, vegetables, and meat without significant limitation.  His weight has been relatively stable.  He  still has intermittent diarrhea,  approximately every 2 to 3 days, but he denies any significant abdominal pain, nausea, or vomiting.  He denies any other complaint today.  REVIEW OF SYSTEMS:   Constitutional: ( - ) fevers, ( - )  chills , ( - ) night sweats Eyes: ( - ) blurriness of vision, ( - ) double vision, ( - ) watery eyes Ears, nose, mouth, throat, and face: ( - ) mucositis, ( - ) sore throat Respiratory: ( - ) cough, ( - ) dyspnea, ( - ) wheezes Cardiovascular: ( - ) palpitation, ( - ) chest discomfort, ( - ) lower extremity swelling Gastrointestinal:  ( - ) nausea, ( - ) heartburn, ( + ) change in bowel habits Skin: ( - ) abnormal skin rashes Lymphatics: ( - ) new lymphadenopathy, ( - ) easy bruising Neurological: ( - ) numbness, ( - ) tingling, ( - ) new weaknesses Behavioral/Psych: ( - ) mood change, ( - ) new changes  All other systems were reviewed with the patient and are negative.  SUMMARY OF ONCOLOGIC HISTORY: Oncology History  Pancreatic adenocarcinoma [facility])   Initial Diagnosis   Pancreatic adenocarcinoma ([place])   12/15/2018 Imaging   CT abdomen/pelvis w/ contrast: IMPRESSION: 1. Suggestion of a 1.5 cm mass within the mid aspect of the pancreatic body with associated atrophy of the upstream pancreatic tail and associated pancreatic ductal dilatation. Recommend further evaluation with MRI. There is soft tissue fullness around the superior mesenteric artery and celiac axis. This may represent edema or potentially tumor involvement. 2. Tree-in-bud nodularity within the peripheral aspect of the lower lobes bilaterally which may be secondary to an infectious or inflammatory process. Recommend follow-up chest CT in 3 months to assess for interval change. 3. These results will be called to the ordering clinician or representative by the Radiologist Assistant, and communication documented in the PACS or zVision Dashboard.   12/29/2018 Procedure   EUS: -2.4 x 1.9cm mass in the body of the pancreas  causing the main pancreatic duct obstruction and clearly involving the celiac trunk. Biopsied. -Heterogenous soft tissue mass in the neck, measuring 3.6cm maximally. This was not sampled, presumed to be a large thyroid gland.  -No peripancreatic adenopathy -CBD was normal, non-dilated.   12/29/2018 Pathology Results   CASE: [id number]   DIAGNOSIS:  - Malignant cells consistent with adenocarcinoma  - See comment.   01/17/2019 Imaging   PET: IMPRESSION: 1. Suspected enlargement of the pancreatic mass, with abnormal hypermetabolic activity at the junction of the pancreatic body and tail currently measuring 3.4 by 2.9 by 2.4 cm. Although anatomic localization is hampered by the patient's extreme paucity of adipose tissue (making separation of adjacent structures difficult on noncontrast CT), there is thought to be a high likelihood tumor extending around the vicinity of the celiac trunk and possibly the superior mesenteric artery. In this case, MRI might provide better anatomic localization with respect to perivascular involvement. 2. Potential mild left periaortic adenopathy, maximum SUV of the indistinct left periaortic lymph nodes is approximately 3.3 which is above blood pool levels. 3. Scattered ground-glass density nodules in the lungs favoring the upper lobes, with tree-in-bud nodularity in the lung bases favoring the lower lobes. None of these lesions are hypermetabolic. While possibilities include atypical infectious process drug reaction, surveillance of the chest is recommended to exclude low-grade adenocarcinoma. 4.  Aortic Atherosclerosis (ICD10-I70.0).   01/24/2019 -  Chemotherapy   The patient had PACLitaxel-protein bound (ABRAXANE) chemo infusion 150 mg, 100  mg/m2 = 150 mg (100 % of original dose 100 mg/m2), Intravenous,  Once, 1 of 4 cycles Dose modification: 100 mg/m2 (original dose 100 mg/m2, Cycle 1, Reason: Provider Judgment) gemcitabine (GEMZAR) 1,102 mg in  sodium chloride 0.9 % 250 mL chemo infusion, 800 mg/m2 = 1,102 mg (100 % of original dose 800 mg/m2), Intravenous,  Once, 1 of 4 cycles Dose modification: 800 mg/m2 (original dose 800 mg/m2, Cycle 1, Reason: Provider Judgment)  for chemotherapy treatment.      I have reviewed the past medical history, past surgical history, social history and family history with the patient and they are unchanged from previous note.  ALLERGIES:  is allergic to penicillins.  MEDICATIONS:  Current Outpatient Medications  Medication Sig Dispense Refill  . lidocaine-prilocaine (EMLA) cream Apply to affected area once 30 g 3  . morphine (MS CONTIN) 15 MG 12 hr tablet Take 1 tablet (15 mg total) by mouth every 12 (twelve) hours. 60 tablet 0  . morphine (MSIR) 15 MG tablet Take 1 tablet (15 mg total) by mouth every 6 (six) hours as needed for severe pain. 60 tablet 0  . cyclobenzaprine (FLEXERIL) 10 MG tablet Take 10 mg by mouth 3 (three) times daily as needed for muscle spasms.    . diphenhydramine-acetaminophen (TYLENOL PM) 25-500 MG TABS tablet Take 2 tablets by mouth at bedtime as needed (sleep).    . lipase/protease/amylase (CREON) 36000 UNITS CPEP capsule Take 1 capsule (36,000 Units total) by mouth 3 (three) times daily with meals. (Patient not taking: Reported on 01/24/2019) 90 capsule 5  . LORazepam (ATIVAN) 0.5 MG tablet Take 1 tablet (0.5 mg total) by mouth every 6 (six) hours as needed (Nausea or vomiting). (Patient not taking: Reported on 01/24/2019) 30 tablet 0  . ondansetron (ZOFRAN) 8 MG tablet Take 1 tablet (8 mg total) by mouth 2 (two) times daily as needed (Nausea or vomiting). (Patient not taking: Reported on 01/24/2019) 30 tablet 1  . prochlorperazine (COMPAZINE) 10 MG tablet Take 1 tablet (10 mg total) by mouth every 6 (six) hours as needed (Nausea or vomiting). (Patient not taking: Reported on 01/24/2019) 30 tablet 1   No current facility-administered medications for this visit.     Facility-Administered Medications Ordered in Other Visits  Medication Dose Route Frequency Provider Last Rate Last Dose  . 0.9 %  sodium chloride infusion   Intravenous Once Tish Men, MD      . heparin lock flush 100 unit/mL  500 Units Intracatheter Once PRN Tish Men, MD      . oxyCODONE-acetaminophen (PERCOCET/ROXICET) 5-325 MG per tablet 2 tablet  2 tablet Oral Once Tish Men, MD      . sodium chloride flush (NS) 0.9 % injection 10 mL  10 mL Intracatheter PRN Tish Men, MD        PHYSICAL EXAMINATION: ECOG PERFORMANCE STATUS: 2 - Symptomatic, <50% confined to bed  Today's Vitals   01/24/19 1332 01/24/19 1335  BP: 130/85   Pulse: 70   Resp: 16   Temp: 98.9 F (37.2 C)   TempSrc: Temporal   SpO2: 100%   Weight: 91 lb 11.2 oz (41.6 kg)   Height: 5\' 8"  (1.727 m)   PainSc:  4    Body mass index is 13.94 kg/m.  Filed Weights   01/24/19 1332  Weight: 91 lb 11.2 oz (41.6 kg)    GENERAL: alert, no distress, very thin SKIN: skin color, texture, turgor are normal, no rashes or significant lesions EYES: conjunctiva are  pink and non-injected, sclera clear OROPHARYNX: no exudate, no erythema; lips, buccal mucosa, and tongue normal  NECK: supple, non-tender LUNGS: clear to auscultation with normal breathing effort HEART: regular rate & rhythm and no murmurs and no lower extremity edema ABDOMEN: soft, non-tender, non-distended, normal bowel sounds Musculoskeletal: severe sarcopenia throughout  PSYCH: alert & oriented x 3, fluent speech  LABORATORY DATA:  I have reviewed the data as listed    Component Value Date/Time   NA 136 01/10/2019 1012   K 4.1 01/10/2019 1012   CL 101 01/10/2019 1012   CO2 29 01/10/2019 1012   GLUCOSE 107 (H) 01/10/2019 1012   BUN 10 01/10/2019 1012   CREATININE 0.79 01/10/2019 1012   CALCIUM 9.3 01/10/2019 1012   PROT 7.2 01/10/2019 1012   ALBUMIN 4.0 01/10/2019 1012   AST 73 (H) 01/10/2019 1012   ALT 158 (H) 01/10/2019 1012   ALKPHOS 75  01/10/2019 1012   BILITOT 0.4 01/10/2019 1012   GFRNONAA >60 01/10/2019 1012   GFRAA >60 01/10/2019 1012    No results found for: SPEP, UPEP  Lab Results  Component Value Date   WBC 5.3 01/24/2019   NEUTROABS 3.7 01/24/2019   HGB 13.6 01/24/2019   HCT 40.4 01/24/2019   MCV 87.6 01/24/2019   PLT 227 01/24/2019      Chemistry      Component Value Date/Time   NA 136 01/10/2019 1012   K 4.1 01/10/2019 1012   CL 101 01/10/2019 1012   CO2 29 01/10/2019 1012   BUN 10 01/10/2019 1012   CREATININE 0.79 01/10/2019 1012      Component Value Date/Time   CALCIUM 9.3 01/10/2019 1012   ALKPHOS 75 01/10/2019 1012   AST 73 (H) 01/10/2019 1012   ALT 158 (H) 01/10/2019 1012   BILITOT 0.4 01/10/2019 1012       RADIOGRAPHIC STUDIES: I have personally reviewed the radiological images as listed below and agreed with the findings in the report. Nm Pet Image Initial (pi) Skull Base To Thigh  Result Date: 01/17/2019 CLINICAL DATA:  Initial treatment strategy for pancreatic adenocarcinoma. EXAM: NUCLEAR MEDICINE PET SKULL BASE TO THIGH TECHNIQUE: 6.0 mCi F-18 FDG was injected intravenously. Full-ring PET imaging was performed from the skull base to thigh after the radiotracer. CT data was obtained and used for attenuation correction and anatomic localization. Fasting blood glucose: 98 mg/dl COMPARISON:  Multiple exams, including 12/15/2018 FINDINGS: Mediastinal blood pool activity: SUV max 2.0 Liver activity: SUV max NA There is a marked paucity of adipose tissues. NECK: No significant abnormal hypermetabolic activity in this region. Incidental CT findings: none CHEST: No significant abnormal hypermetabolic activity in this region. Incidental CT findings: Right Port-A-Cath noted, tip at the cavoatrial junction. Small amount of gas surround the Port-A-Cath due to recent placement (yesterday), this is normal. There are few scattered ground-glass density nodules in the lungs, which are not  hypermetabolic. Index lesions include a 1.0 by 0.7 cm right upper lobe lesion on image 24/8; a 1.2 by 0.1 cm left lower lobe lesion on image 39/8; a 1.0 by 1.0 cm left upper lobe nodule on image 22/8; and a central lucent lesion with mild marginal ground-glass opacity on image 20/8 in the right upper lobe measuring 1.0 by 0.8 cm. Several smaller similar lesions are present in the upper lobes. Scattered in the lower lobes there are reticulonodular interstitial opacities favoring atypical infectious bronchiolitis. Mild lingular and right middle lobe involvement as well. Two adjacent nodules are present in  the posterior basal segment right lower lobe each at each about 5 mm in diameter on image 64/8. None of these nodules are appreciably hypermetabolic. ABDOMEN/PELVIS: Due to the paucity of adipose tissue and possibly partially due to the mass itself, tissue planes are extremely indistinct in the upper abdomen, making it difficult to identify the mass on the CT data. However, abnormal hypermetabolic activity corresponding to the location of the mass measuring 3.4 by 2.9 by 2.4 cm is present, maximum SUV 7.4. Based on the distribution of the associated hypermetabolic activity, my expectation is that this mass is extending around the celiac trunk possibly abutting the superior mesenteric artery, although these structures are indistinct. MRI would probably lend itself better to anatomic localization in this particular patient's case given the severe paucity of adipose tissue. Potential mild left periaortic adenopathy, maximum SUV in this vicinity 3.3. No hypermetabolic liver lesion is identified. Incidental CT findings: Aortoiliac atherosclerotic vascular disease. Contracted gallbladder. SKELETON: No significant abnormal hypermetabolic activity in this region. Incidental CT findings: none IMPRESSION: 1. Suspected enlargement of the pancreatic mass, with abnormal hypermetabolic activity at the junction of the pancreatic  body and tail currently measuring 3.4 by 2.9 by 2.4 cm. Although anatomic localization is hampered by the patient's extreme paucity of adipose tissue (making separation of adjacent structures difficult on noncontrast CT), there is thought to be a high likelihood tumor extending around the vicinity of the celiac trunk and possibly the superior mesenteric artery. In this case, MRI might provide better anatomic localization with respect to perivascular involvement. 2. Potential mild left periaortic adenopathy, maximum SUV of the indistinct left periaortic lymph nodes is approximately 3.3 which is above blood pool levels. 3. Scattered ground-glass density nodules in the lungs favoring the upper lobes, with tree-in-bud nodularity in the lung bases favoring the lower lobes. None of these lesions are hypermetabolic. While possibilities include atypical infectious process drug reaction, surveillance of the chest is recommended to exclude low-grade adenocarcinoma. 4.  Aortic Atherosclerosis (ICD10-I70.0). Electronically Signed   By: Van Clines M.D.   On: 01/17/2019 15:34   Ir Imaging Guided Port Insertion  Result Date: 01/16/2019 INDICATION: 38 year old with pancreatic cancer. Port-A-Cath needed for chemotherapy. EXAM: FLUOROSCOPIC AND ULTRASOUND GUIDED PLACEMENT OF A SUBCUTANEOUS PORT COMPARISON:  None. MEDICATIONS: Clindamycin 900 mg; The antibiotic was administered within an appropriate time interval prior to skin puncture. ANESTHESIA/SEDATION: Versed 4.0 mg IV; Fentanyl 100 mcg IV; Moderate Sedation Time:  35 minutes The patient was continuously monitored during the procedure by the interventional radiology nurse under my direct supervision. FLUOROSCOPY TIME:  18 seconds, 1 mGy COMPLICATIONS: None immediate. PROCEDURE: The procedure, risks, benefits, and alternatives were explained to the patient. Questions regarding the procedure were encouraged and answered. The patient understands and consents to the  procedure. Patient was placed supine on the interventional table. Ultrasound confirmed a patent right internal jugular vein. Ultrasound image was saved for documentation. The right chest and neck were cleaned with a skin antiseptic and a sterile drape was placed. Maximal barrier sterile technique was utilized including caps, mask, sterile gowns, sterile gloves, sterile drape, hand hygiene and skin antiseptic. The right neck was anesthetized with 1% lidocaine. Small incision was made in the right neck with a blade. Micropuncture set was placed in the right internal jugular vein with ultrasound guidance. The micropuncture wire was used for measurement purposes. The right chest was anesthetized with 1% lidocaine with epinephrine. #15 blade was used to make an incision and a subcutaneous port pocket was formed.  Ohiopyle was assembled. Subcutaneous tunnel was formed with a stiff tunneling device. The port catheter was brought through the subcutaneous tunnel. The port was placed in the subcutaneous pocket. The micropuncture set was exchanged for a peel-away sheath. The catheter was placed through the peel-away sheath and the tip was positioned at the SVC and right atrium junction. Catheter placement was confirmed with fluoroscopy. The port was accessed and flushed with heparinized saline. The port pocket was closed using two layers of absorbable sutures and Dermabond. The vein skin site was closed using a single layer of absorbable suture and Dermabond. Sterile dressings were applied. Patient tolerated the procedure well without an immediate complication. Ultrasound and fluoroscopic images were taken and saved for this procedure. FINDINGS: Patent right internal jugular vein. Catheter tip at the SVC and right atrium junction. Large solid nodule identified in the right thyroid lobe and isthmus region. IMPRESSION: Placement of a subcutaneous port device. Catheter tip at the SVC and right atrium junction. **An  incidental finding of potential clinical significance has been found. Thyroid nodule. Consider further characterization with a dedicated thyroid ultrasound.** These results will be called to the ordering clinician or representative by the Radiologist Assistant, and communication documented in the PACS or zVision Dashboard. Electronically Signed   By: Markus Daft M.D.   On: 01/16/2019 15:56

## 2019-01-24 NOTE — Telephone Encounter (Signed)
Per 10/21 los no change in appts

## 2019-01-25 ENCOUNTER — Telehealth: Payer: Self-pay

## 2019-01-25 ENCOUNTER — Other Ambulatory Visit: Payer: Self-pay | Admitting: Hematology

## 2019-01-25 ENCOUNTER — Telehealth: Payer: Self-pay | Admitting: *Deleted

## 2019-01-25 ENCOUNTER — Encounter: Payer: Self-pay | Admitting: Hematology

## 2019-01-25 ENCOUNTER — Inpatient Hospital Stay: Payer: Medicaid Other | Admitting: General Practice

## 2019-01-25 ENCOUNTER — Ambulatory Visit: Payer: Self-pay

## 2019-01-25 DIAGNOSIS — C259 Malignant neoplasm of pancreas, unspecified: Secondary | ICD-10-CM

## 2019-01-25 NOTE — Progress Notes (Signed)
Called Robin(sgo) to introduce myself as Arboriculturist and to advise of what I helped patient with on 01/24/19. Advised patient was approved for the $700 [place] and was given a copy of the approval letter as well as the expense sheet with the Outpatient pharmacy information and was given a gas card and my card. She states she has that.  Advised that his appointment today is with a Development worker, community at [employer]. Asked if she has the number and she states she does and will call them.

## 2019-01-25 NOTE — Telephone Encounter (Signed)
Signed and completed [employer] Requisition form faxed to company. dph

## 2019-01-25 NOTE — Progress Notes (Signed)
[facility] CSW Progress Notes  Phone visit to check in with patient progress on meeting goals.  Is feeling poorly post yesterday's chemo, no appetite, "cannot force himself to eat."  Is drinking small amounts.  Has received and returned paperwork for disability applications to [facility].  Celesta Gentile will help him apply for MEdicaid and Physicist, medical at [facility].  Is waiting for call back from Stefanie Libel re financial counseling at [facility].  All needs met although finances are tight.  [place] has appt w patient on Monday at 11 AM by phone to continue helping w filing for disability. Per Marguarite Arbour patient was approved for [facility] yesterday.  Gave this info to fiancee.    Edwyna Shell, LCSW Clinical Social Worker Phone:  [phone]

## 2019-01-26 ENCOUNTER — Encounter: Payer: Self-pay | Admitting: *Deleted

## 2019-01-26 NOTE — Progress Notes (Signed)
Received the follow message from [employer] regarding the specimen request:  Hello Dr. Vedia Coffer, Your recent xT order has been closed because we were unable to obtain tumor tissue to begin Next Generation DNA Sequencing. This order will be reactivated automatically if a tumor sample is received by [employer] for testing. View closed orderCustomer Service: [phone]  Dr Maylon Peppers notified.

## 2019-01-30 ENCOUNTER — Telehealth: Payer: Self-pay | Admitting: *Deleted

## 2019-01-30 NOTE — Telephone Encounter (Signed)
-----   Message from Tish Men, MD sent at 01/24/2019  3:22 PM EDT ----- Regarding: RE: Creon Okay thanks.    I will include Thu in the message and see if she can help follow up the application.  [place]  ----- Message ----- From: Arna Snipe, RN Sent: 01/24/2019   2:30 PM EDT To: Tish Men, MD Subject: RE: Creon                                      The Nurse Ambassador should have reached out to him. Once that is faxed I am out of the loop. Each patient is given the second sheet of the application with the number to follow up. I will reach out to him to figure this out.  The problem that I've run into is that patients are not returning the calls to the NA. Dawn  ----- Message ----- From: Tish Men, MD Sent: 01/24/2019   2:01 PM EDT To: Arna Snipe, RN Subject: Creon                                          Hi Dawn,  Mr. Eisenbeis filled out his Creon medication assistance a few weeks ago, but he hasn't heard any updates. Do you know where he stands with his application?Thanks.  [place]

## 2019-01-30 NOTE — Telephone Encounter (Signed)
Spoke with pt today, and was informed that pt had filled out assistance form for Creon and had sent form back today.  Instructed pt to follow up with the nurse ambassador for the needed medication.  Pt voiced understanding.

## 2019-02-01 ENCOUNTER — Encounter: Payer: Self-pay | Admitting: Pharmacy Technician

## 2019-02-07 ENCOUNTER — Other Ambulatory Visit: Payer: Self-pay

## 2019-02-07 ENCOUNTER — Telehealth: Payer: Self-pay | Admitting: *Deleted

## 2019-02-07 ENCOUNTER — Ambulatory Visit: Payer: Self-pay

## 2019-02-07 ENCOUNTER — Ambulatory Visit: Payer: Self-pay | Admitting: Hematology

## 2019-02-07 ENCOUNTER — Encounter: Payer: Self-pay | Admitting: Nutrition

## 2019-02-07 NOTE — Telephone Encounter (Signed)
Received call from patient's fiancee Robin.  She called to cancel pt's appts for today as he does not feel well. Upon further questioning, pt does not feel well due to abdominal discomfort and back pain from constipation.  Pt takes MSContin and MSIR for pain relief but has not been taking anything for the resulting constipation. Shirlean Mylar also states that pt does not want to continue chemo due to the effects it is having on his appetite.Discussed with Dr. Maylon Peppers.  We will re-schuedule pt for next week.  Discussed with Shirlean Mylar the need for daily bowel regime while on narcotics. Pt is to take miralax daily along with Sennakot S 2-3 tabs daily (to adjust as needed) and to have some Milk of Magnesia available if the above 2 are not effective. Advised to increase water/fluid intake as well. Robin voiced understanding. Scheduling message sent.

## 2019-02-08 ENCOUNTER — Ambulatory Visit: Payer: Self-pay

## 2019-02-08 ENCOUNTER — Telehealth: Payer: Self-pay | Admitting: Hematology

## 2019-02-08 NOTE — Telephone Encounter (Signed)
R/s appt per 11/4 sch message - pt aware of appt changes.

## 2019-02-09 ENCOUNTER — Telehealth: Payer: Self-pay | Admitting: *Deleted

## 2019-02-09 ENCOUNTER — Other Ambulatory Visit: Payer: Self-pay | Admitting: Hematology

## 2019-02-09 DIAGNOSIS — C259 Malignant neoplasm of pancreas, unspecified: Secondary | ICD-10-CM

## 2019-02-09 MED ORDER — MORPHINE SULFATE ER 15 MG PO TBCR: 15.0000 mg | EXTENDED_RELEASE_TABLET | Freq: Two times a day (BID) | ORAL | 0 refills | Status: DC

## 2019-02-09 MED ORDER — MORPHINE SULFATE 15 MG PO TABS: 15.0000 mg | ORAL_TABLET | Freq: Four times a day (QID) | ORAL | 0 refills | Status: DC | PRN

## 2019-02-09 NOTE — Telephone Encounter (Signed)
Received vm message from pt's fiancee requesting refills on his MSContin and his MSIR. He only has 1 MSIR left  He uses Product/process development scientist on [employer]

## 2019-02-14 ENCOUNTER — Other Ambulatory Visit: Payer: Self-pay

## 2019-02-14 ENCOUNTER — Inpatient Hospital Stay: Payer: Medicaid Other | Attending: Hematology

## 2019-02-14 ENCOUNTER — Encounter: Payer: Self-pay | Admitting: Hematology

## 2019-02-14 ENCOUNTER — Inpatient Hospital Stay (HOSPITAL_BASED_OUTPATIENT_CLINIC_OR_DEPARTMENT_OTHER): Payer: Medicaid Other | Admitting: Hematology

## 2019-02-14 ENCOUNTER — Inpatient Hospital Stay: Payer: Medicaid Other | Admitting: Nutrition

## 2019-02-14 ENCOUNTER — Inpatient Hospital Stay: Payer: Medicaid Other

## 2019-02-14 VITALS — BP 105/63 | HR 74 | Temp 98.9°F | Resp 17 | Ht 68.0 in | Wt 93.7 lb

## 2019-02-14 DIAGNOSIS — Z5111 Encounter for antineoplastic chemotherapy: Secondary | ICD-10-CM | POA: Insufficient documentation

## 2019-02-14 DIAGNOSIS — Z800 Family history of malignant neoplasm of digestive organs: Secondary | ICD-10-CM

## 2019-02-14 DIAGNOSIS — Z79899 Other long term (current) drug therapy: Secondary | ICD-10-CM | POA: Insufficient documentation

## 2019-02-14 DIAGNOSIS — D6481 Anemia due to antineoplastic chemotherapy: Secondary | ICD-10-CM | POA: Diagnosis not present

## 2019-02-14 DIAGNOSIS — E46 Unspecified protein-calorie malnutrition: Secondary | ICD-10-CM | POA: Diagnosis not present

## 2019-02-14 DIAGNOSIS — C251 Malignant neoplasm of body of pancreas: Secondary | ICD-10-CM | POA: Diagnosis present

## 2019-02-14 DIAGNOSIS — T451X5A Adverse effect of antineoplastic and immunosuppressive drugs, initial encounter: Secondary | ICD-10-CM | POA: Diagnosis not present

## 2019-02-14 DIAGNOSIS — R748 Abnormal levels of other serum enzymes: Secondary | ICD-10-CM | POA: Insufficient documentation

## 2019-02-14 DIAGNOSIS — G893 Neoplasm related pain (acute) (chronic): Secondary | ICD-10-CM

## 2019-02-14 DIAGNOSIS — C259 Malignant neoplasm of pancreas, unspecified: Secondary | ICD-10-CM

## 2019-02-14 DIAGNOSIS — Z95828 Presence of other vascular implants and grafts: Secondary | ICD-10-CM

## 2019-02-14 LAB — CMP (CANCER CENTER ONLY)
ALT: 141 U/L — ABNORMAL HIGH (ref 0–44)
AST: 52 U/L — ABNORMAL HIGH (ref 15–41)
Albumin: 3.5 g/dL (ref 3.5–5.0)
Alkaline Phosphatase: 83 U/L (ref 38–126)
Anion gap: 7 (ref 5–15)
BUN: 7 mg/dL (ref 6–20)
CO2: 28 mmol/L (ref 22–32)
Calcium: 8.7 mg/dL — ABNORMAL LOW (ref 8.9–10.3)
Chloride: 104 mmol/L (ref 98–111)
Creatinine: 0.69 mg/dL (ref 0.61–1.24)
GFR, Est AFR Am: >60 mL/min (ref >60)
GFR, Estimated: >60 mL/min (ref >60)
Glucose, Bld: 126 mg/dL — ABNORMAL HIGH (ref 70–99)
Potassium: 3.6 mmol/L (ref 3.5–5.1)
Sodium: 139 mmol/L (ref 135–145)
Total Bilirubin: <0.2 mg/dL — ABNORMAL LOW (ref 0.3–1.2)
Total Protein: 6.3 g/dL — ABNORMAL LOW (ref 6.5–8.1)

## 2019-02-14 LAB — CBC WITH DIFFERENTIAL (CANCER CENTER ONLY)
Abs Immature Granulocytes: 0.03 10*3/uL (ref 0.00–0.07)
Basophils Absolute: 0 10*3/uL (ref 0.0–0.1)
Basophils Relative: 1 %
Eosinophils Absolute: 0.1 10*3/uL (ref 0.0–0.5)
Eosinophils Relative: 1 %
HCT: 35.3 % — ABNORMAL LOW (ref 39.0–52.0)
Hemoglobin: 11.5 g/dL — ABNORMAL LOW (ref 13.0–17.0)
Immature Granulocytes: 1 %
Lymphocytes Relative: 26 %
Lymphs Abs: 1.4 10*3/uL (ref 0.7–4.0)
MCH: 29.3 pg (ref 26.0–34.0)
MCHC: 32.6 g/dL (ref 30.0–36.0)
MCV: 89.8 fL (ref 80.0–100.0)
Monocytes Absolute: 0.3 10*3/uL (ref 0.1–1.0)
Monocytes Relative: 6 %
Neutro Abs: 3.4 10*3/uL (ref 1.7–7.7)
Neutrophils Relative %: 65 %
Platelet Count: 365 10*3/uL (ref 150–400)
RBC: 3.93 MIL/uL — ABNORMAL LOW (ref 4.22–5.81)
RDW: 15.3 % (ref 11.5–15.5)
WBC Count: 5.1 10*3/uL (ref 4.0–10.5)
nRBC: 0 % (ref 0.0–0.2)

## 2019-02-14 MED ORDER — PROCHLORPERAZINE MALEATE 10 MG PO TABS
10.0000 mg | ORAL_TABLET | Freq: Once | ORAL | Status: AC
  Administered 2019-02-14: 10 mg via ORAL

## 2019-02-14 MED ORDER — HEPARIN SOD (PORK) LOCK FLUSH 100 UNIT/ML IV SOLN
500.0000 [IU] | Freq: Once | INTRAVENOUS | Status: AC | PRN
  Administered 2019-02-14: 500 [IU]
  Filled 2019-02-14: qty 5

## 2019-02-14 MED ORDER — SODIUM CHLORIDE 0.9 % IV SOLN
800.0000 mg/m2 | Freq: Once | INTRAVENOUS | Status: AC
  Administered 2019-02-14: 1102 mg via INTRAVENOUS
  Filled 2019-02-14: qty 28.98

## 2019-02-14 MED ORDER — SODIUM CHLORIDE 0.9% FLUSH
10.0000 mL | INTRAVENOUS | Status: DC | PRN
  Administered 2019-02-14: 11:00:00 10 mL via INTRAVENOUS
  Filled 2019-02-14: qty 10

## 2019-02-14 MED ORDER — SODIUM CHLORIDE 0.9 % IV SOLN
Freq: Once | INTRAVENOUS | Status: AC
  Administered 2019-02-14: 12:00:00 via INTRAVENOUS
  Filled 2019-02-14: qty 250

## 2019-02-14 MED ORDER — SODIUM CHLORIDE 0.9% FLUSH
10.0000 mL | INTRAVENOUS | Status: DC | PRN
  Administered 2019-02-14: 10 mL
  Filled 2019-02-14: qty 10

## 2019-02-14 MED ORDER — PACLITAXEL PROTEIN-BOUND CHEMO INJECTION 100 MG
100.0000 mg/m2 | Freq: Once | INTRAVENOUS | Status: AC
  Administered 2019-02-14: 150 mg via INTRAVENOUS
  Filled 2019-02-14: qty 30

## 2019-02-14 MED ORDER — PROCHLORPERAZINE MALEATE 10 MG PO TABS
ORAL_TABLET | ORAL | Status: AC
  Filled 2019-02-14: qty 1

## 2019-02-14 NOTE — Progress Notes (Signed)
Nutrition follow-up completed with patient during infusion for pancreas cancer. Patient reports he feels better and weight has improved to 93.7 pounds November 11. Noted glucose 126. Patient reports that he is not taking Creon because he has not picked up the prescription.  States he has filled out the paperwork but has not heard back. He denies nutrition impact symptoms.  Nutrition diagnosis: Underweight continues.  Intervention: Recommended patient continue Ensure Enlive 3 times daily between meals. Provided second complementary case. Encouraged high-calorie high-protein foods as tolerated throughout the day.  Monitoring, evaluation, goals: Patient will tolerate increased calories and protein for weight gain.  Next visit: To be scheduled as needed.  **Disclaimer: This note was dictated with voice recognition software. Similar sounding words can inadvertently be transcribed and this note may contain transcription errors which may not have been corrected upon publication of note.**

## 2019-02-14 NOTE — Patient Instructions (Addendum)
[facility] Discharge Instructions for Patients Receiving Chemotherapy  Today you received the following chemotherapy agents Gemzar and Abraxane.  To help prevent nausea and vomiting after your treatment, we encourage you to take your nausea medication as directed.  If you develop nausea and vomiting that is not controlled by your nausea medication, call the clinic.   BELOW ARE SYMPTOMS THAT SHOULD BE REPORTED IMMEDIATELY:  *FEVER GREATER THAN 100.5 F  *CHILLS WITH OR WITHOUT FEVER  NAUSEA AND VOMITING THAT IS NOT CONTROLLED WITH YOUR NAUSEA MEDICATION  *UNUSUAL SHORTNESS OF BREATH  *UNUSUAL BRUISING OR BLEEDING  TENDERNESS IN MOUTH AND THROAT WITH OR WITHOUT PRESENCE OF ULCERS  *URINARY PROBLEMS  *BOWEL PROBLEMS  UNUSUAL RASH Items with * indicate a potential emergency and should be followed up as soon as possible.  Feel free to call the clinic should you have any questions or concerns. The clinic phone number is [phone].  Please show the [place] at check-in to the Emergency Department and triage nurse.       [facility]    , [place]    Phone:      February 14, 2019  Patient: Gabriel [name]  Date of Birth: [birth year]  Date of Visit: February 14, 2019    To Whom It May Concern:  [name] was seen and treated on February 14, 2019.    .           If you have any questions or concerns, please don't hesitate to call.   Sincerely,       Treatment Team:  Attending Provider: Tish Men, MD

## 2019-02-14 NOTE — Progress Notes (Signed)
Per Dr Maylon Peppers ok to tx today with ALT 141.

## 2019-02-14 NOTE — Progress Notes (Signed)
[facility] OFFICE PROGRESS NOTE  Patient Care Team: Kerin Perna, NP as PCP - General (Internal Medicine)  HEME/ONC OVERVIEW: 1. 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, unresectable  -S/p C1D1 of palliative gemcitabine/Abraxane -Despite dose reduction at the start of chemotherapy, patient tolerated the 1st treatment poorly  -I discussed with the patient that we can try one more treatment to see if he tolerates it better, or change to a different regimen -After lengthy discussion, patient agreed to receive one more treatment of gemcitabine/Abraxane -If he tolerates it poorly, we can change his treatment to FOLFOX -Labs adequate today, proceed with C1D15 of gemcitabine/Abraxane 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 -Hgb 11.5, new  -Patient denies any symptom of bleeding -We will monitor for now; no indication for dose adjustment  Transaminitis -Etiology unclear; AST and ALT elevated prior to starting chemotherapy, making chemotherapy-induced liver injury less likely -AST 52, ALT 141, stable; Tbili normal -I discussed with pharmacy, and there is no indication for dose adjustment -We will monitor it for now  Abdominal pain -Secondary to vascular and nerve involvement by  pancreatic cancer  -Currently on MS-Contin 15mg  BID and IR morphine 15mg  q6hrs PRN for pain control  -Pain relatively well controlled -If the pain becomes worse in the future, we can consider celiac nerve block vs. SBRT   Protein malnutrition -Currently taking Creon; able to eat and drink without limitation -I encouraged the patient to use nutritional supplements as able, especially during the days that he has little appetite -Continue follow-up with nutrition  Family history of malignancy -Patient declined genetic evaluation despite extensive family hx of malignancy -I strongly recommended the patient to consider genetic evaluation, especially as he has several young children -He would like to think more about it, and we will continue the discussion to assess his willingness to have the testing done  No orders of the defined types were placed in this encounter.  All questions were answered. The patient knows to call the clinic with any problems, questions or concerns. No barriers to learning was detected.  Return in 2 weeks at [place] for toxicity check.   Tish Men, MD 02/14/2019 11:25 AM  CHIEF COMPLAINT: "I am finally feeling a little better"  INTERVAL HISTORY: [name] returns to clinic for follow-up of locally advanced pancreatic adenocarcinoma palliative chemotherapy.  Patient reports that approximately 4 to 5 days after the first treatment, he developed worsening severe abdominal pain despite taking opioid medications, as well as lack of appetite and fatigue.  The symptoms lasted 3 to 4 days before they resolved, and he has been able to resume eating and gained a few pounds since last visit.  His energy level has also improved.  His abdominal pain is much better.  He spoke with his family at length regarding his willingness to take more treatment, and ultimately decided to continue treatment if he can tolerate it at the encouragement of his family.  He denies any other complaint  today.  REVIEW OF SYSTEMS:   Constitutional: ( - ) fevers, ( - )  chills , ( - ) night sweats Eyes: ( - ) blurriness of vision, ( - ) double vision, ( - ) watery eyes Ears, nose, mouth, throat, and face: ( - ) mucositis, ( - ) sore throat Respiratory: ( - ) cough, ( - ) dyspnea, ( - ) wheezes Cardiovascular: ( - ) palpitation, ( - ) chest discomfort, ( - ) lower extremity swelling Gastrointestinal:  ( - ) nausea, ( - ) heartburn, ( - ) change in bowel habits Skin: ( - ) abnormal skin rashes Lymphatics: ( - ) new lymphadenopathy, ( - ) easy bruising Neurological: ( - ) numbness, ( - ) tingling, ( - ) new weaknesses Behavioral/Psych: ( - ) mood change, ( - ) new changes  All other systems were reviewed with the patient and are negative.  SUMMARY OF ONCOLOGIC HISTORY: Oncology History  Pancreatic adenocarcinoma [facility])   Initial Diagnosis   Pancreatic adenocarcinoma ([place])   12/15/2018 Imaging   CT abdomen/pelvis w/ contrast: IMPRESSION: 1. Suggestion of a 1.5 cm mass within the mid aspect of the pancreatic body with associated atrophy of the upstream pancreatic tail and associated pancreatic ductal dilatation. Recommend further evaluation with MRI. There is soft tissue fullness around the superior mesenteric artery and celiac axis. This may represent edema or potentially tumor involvement. 2. Tree-in-bud nodularity within the peripheral aspect of the lower lobes bilaterally which may be secondary to an infectious or inflammatory process. Recommend follow-up chest CT in 3 months to assess for interval change. 3. These results will be called to the ordering clinician or representative by the Radiologist Assistant, and communication documented in the PACS or zVision Dashboard.   12/29/2018 Procedure   EUS: -2.4 x 1.9cm mass in the body of the pancreas causing the main pancreatic duct obstruction and clearly involving the celiac trunk. Biopsied. -Heterogenous soft tissue mass in the  neck, measuring 3.6cm maximally. This was not sampled, presumed to be a large thyroid gland.  -No peripancreatic adenopathy -CBD was normal, non-dilated.   12/29/2018 Pathology Results   CASE: [id number]   DIAGNOSIS:  - Malignant cells consistent with adenocarcinoma  - See comment.   01/17/2019 Imaging   PET: IMPRESSION: 1. Suspected enlargement of the pancreatic mass, with abnormal hypermetabolic activity at the junction of the pancreatic body and tail currently measuring 3.4 by 2.9 by 2.4 cm. Although anatomic localization is hampered by the patient's extreme paucity of adipose tissue (making separation of adjacent structures difficult on noncontrast CT), there is thought to be a high likelihood tumor extending around the vicinity of the celiac trunk and possibly the superior mesenteric artery. In this case, MRI might provide better anatomic localization with respect to perivascular involvement. 2. Potential mild left periaortic adenopathy, maximum SUV of the indistinct left periaortic lymph nodes is approximately 3.3 which is above blood pool levels. 3. Scattered ground-glass density nodules in the lungs favoring the upper lobes, with tree-in-bud nodularity in the lung bases favoring the lower lobes. None of these lesions are hypermetabolic. While possibilities include atypical infectious process drug reaction, surveillance of the chest is recommended to exclude low-grade adenocarcinoma. 4.  Aortic Atherosclerosis (ICD10-I70.0).   01/24/2019 -  Chemotherapy   The patient had PACLitaxel-protein bound (ABRAXANE) chemo infusion 150 mg, 100 mg/m2 = 150 mg (100 % of original dose 100 mg/m2), Intravenous,  Once, 1 of 4 cycles Dose modification: 100  mg/m2 (original dose 100 mg/m2, Cycle 1, Reason: Provider Judgment) Administration: 150 mg (01/24/2019) gemcitabine (GEMZAR) 1,102 mg in sodium chloride 0.9 % 250 mL chemo infusion, 800 mg/m2 = 1,102 mg (100 % of original dose 800  mg/m2), Intravenous,  Once, 1 of 4 cycles Dose modification: 800 mg/m2 (original dose 800 mg/m2, Cycle 1, Reason: Provider Judgment) Administration: 1,102 mg (01/24/2019)  for chemotherapy treatment.      I have reviewed the past medical history, past surgical history, social history and family history with the patient and they are unchanged from previous note.  ALLERGIES:  is allergic to penicillins.  MEDICATIONS:  Current Outpatient Medications  Medication Sig Dispense Refill  . cyclobenzaprine (FLEXERIL) 10 MG tablet Take 10 mg by mouth 3 (three) times daily as needed for muscle spasms.    . diphenhydramine-acetaminophen (TYLENOL PM) 25-500 MG TABS tablet Take 2 tablets by mouth at bedtime as needed (sleep).    Marland Kitchen lidocaine-prilocaine (EMLA) cream Apply to affected area once 30 g 3  . morphine (MS CONTIN) 15 MG 12 hr tablet Take 1 tablet (15 mg total) by mouth every 12 (twelve) hours. 60 tablet 0  . morphine (MSIR) 15 MG tablet Take 1 tablet (15 mg total) by mouth every 6 (six) hours as needed for severe pain. 60 tablet 0  . polyethylene glycol (MIRALAX / GLYCOLAX) 17 g packet Take 17 g by mouth daily.    Marland Kitchen senna-docusate (SENNA S) 8.6-50 MG tablet Take 2 tablets by mouth at bedtime.    Marland Kitchen LORazepam (ATIVAN) 0.5 MG tablet Take 1 tablet (0.5 mg total) by mouth every 6 (six) hours as needed (Nausea or vomiting). (Patient not taking: Reported on 01/24/2019) 30 tablet 0  . ondansetron (ZOFRAN) 8 MG tablet Take 1 tablet (8 mg total) by mouth 2 (two) times daily as needed (Nausea or vomiting). (Patient not taking: Reported on 01/24/2019) 30 tablet 1  . prochlorperazine (COMPAZINE) 10 MG tablet Take 1 tablet (10 mg total) by mouth every 6 (six) hours as needed (Nausea or vomiting). (Patient not taking: Reported on 01/24/2019) 30 tablet 1   No current facility-administered medications for this visit.    Facility-Administered Medications Ordered in Other Visits  Medication Dose Route Frequency  Provider Last Rate Last Dose  . oxyCODONE-acetaminophen (PERCOCET/ROXICET) 5-325 MG per tablet 2 tablet  2 tablet Oral Once Tish Men, MD        PHYSICAL EXAMINATION: ECOG PERFORMANCE STATUS: 2 - Symptomatic, <50% confined to bed  Today's Vitals   02/14/19 1120 02/14/19 1123  BP: 105/63   Pulse: 74   Resp: 17   Temp: 98.9 F (37.2 C)   TempSrc: Temporal   SpO2: 100%   Weight: 93 lb 11.2 oz (42.5 kg)   Height: 5\' 8"  (1.727 m)   PainSc:  0-No pain   Body mass index is 14.25 kg/m.  Filed Weights   02/14/19 1120  Weight: 93 lb 11.2 oz (42.5 kg)    GENERAL: alert, no distress and comfortable, very thin  SKIN: skin color, texture, turgor are normal, no rashes or significant lesions EYES: conjunctiva are pink and non-injected, sclera clear OROPHARYNX: no exudate, no erythema; lips, buccal mucosa, and tongue normal  NECK: supple, non-tender LUNGS: clear to auscultation with normal breathing effort HEART: regular rate & rhythm and no murmurs and no lower extremity edema ABDOMEN: soft, non-tender, non-distended, normal bowel sounds Musculoskeletal: very little muscle mass  PSYCH: alert & oriented x 3, fluent speech  LABORATORY DATA:  I have  reviewed the data as listed    Component Value Date/Time   NA 139 01/24/2019 1350   K 4.0 01/24/2019 1350   CL 101 01/24/2019 1350   CO2 30 01/24/2019 1350   GLUCOSE 81 01/24/2019 1350   BUN 9 01/24/2019 1350   CREATININE 0.74 01/24/2019 1350   CALCIUM 9.2 01/24/2019 1350   PROT 6.7 01/24/2019 1350   ALBUMIN 3.5 01/24/2019 1350   AST 45 (H) 01/24/2019 1350   ALT 129 (H) 01/24/2019 1350   ALKPHOS 77 01/24/2019 1350   BILITOT 0.3 01/24/2019 1350   GFRNONAA >60 01/24/2019 1350   GFRAA >60 01/24/2019 1350    No results found for: SPEP, UPEP  Lab Results  Component Value Date   WBC 5.1 02/14/2019   NEUTROABS 3.4 02/14/2019   HGB 11.5 (L) 02/14/2019   HCT 35.3 (L) 02/14/2019   MCV 89.8 02/14/2019   PLT 365 02/14/2019       Chemistry      Component Value Date/Time   NA 139 01/24/2019 1350   K 4.0 01/24/2019 1350   CL 101 01/24/2019 1350   CO2 30 01/24/2019 1350   BUN 9 01/24/2019 1350   CREATININE 0.74 01/24/2019 1350      Component Value Date/Time   CALCIUM 9.2 01/24/2019 1350   ALKPHOS 77 01/24/2019 1350   AST 45 (H) 01/24/2019 1350   ALT 129 (H) 01/24/2019 1350   BILITOT 0.3 01/24/2019 1350       RADIOGRAPHIC STUDIES: I have personally reviewed the radiological images as listed below and agreed with the findings in the report. Nm Pet Image Initial (pi) Skull Base To Thigh  Result Date: 01/17/2019 CLINICAL DATA:  Initial treatment strategy for pancreatic adenocarcinoma. EXAM: NUCLEAR MEDICINE PET SKULL BASE TO THIGH TECHNIQUE: 6.0 mCi F-18 FDG was injected intravenously. Full-ring PET imaging was performed from the skull base to thigh after the radiotracer. CT data was obtained and used for attenuation correction and anatomic localization. Fasting blood glucose: 98 mg/dl COMPARISON:  Multiple exams, including 12/15/2018 FINDINGS: Mediastinal blood pool activity: SUV max 2.0 Liver activity: SUV max NA There is a marked paucity of adipose tissues. NECK: No significant abnormal hypermetabolic activity in this region. Incidental CT findings: none CHEST: No significant abnormal hypermetabolic activity in this region. Incidental CT findings: Right Port-A-Cath noted, tip at the cavoatrial junction. Small amount of gas surround the Port-A-Cath due to recent placement (yesterday), this is normal. There are few scattered ground-glass density nodules in the lungs, which are not hypermetabolic. Index lesions include a 1.0 by 0.7 cm right upper lobe lesion on image 24/8; a 1.2 by 0.1 cm left lower lobe lesion on image 39/8; a 1.0 by 1.0 cm left upper lobe nodule on image 22/8; and a central lucent lesion with mild marginal ground-glass opacity on image 20/8 in the right upper lobe measuring 1.0 by 0.8 cm. Several smaller  similar lesions are present in the upper lobes. Scattered in the lower lobes there are reticulonodular interstitial opacities favoring atypical infectious bronchiolitis. Mild lingular and right middle lobe involvement as well. Two adjacent nodules are present in the posterior basal segment right lower lobe each at each about 5 mm in diameter on image 64/8. None of these nodules are appreciably hypermetabolic. ABDOMEN/PELVIS: Due to the paucity of adipose tissue and possibly partially due to the mass itself, tissue planes are extremely indistinct in the upper abdomen, making it difficult to identify the mass on the CT data. However, abnormal hypermetabolic activity corresponding  to the location of the mass measuring 3.4 by 2.9 by 2.4 cm is present, maximum SUV 7.4. Based on the distribution of the associated hypermetabolic activity, my expectation is that this mass is extending around the celiac trunk possibly abutting the superior mesenteric artery, although these structures are indistinct. MRI would probably lend itself better to anatomic localization in this particular patient's case given the severe paucity of adipose tissue. Potential mild left periaortic adenopathy, maximum SUV in this vicinity 3.3. No hypermetabolic liver lesion is identified. Incidental CT findings: Aortoiliac atherosclerotic vascular disease. Contracted gallbladder. SKELETON: No significant abnormal hypermetabolic activity in this region. Incidental CT findings: none IMPRESSION: 1. Suspected enlargement of the pancreatic mass, with abnormal hypermetabolic activity at the junction of the pancreatic body and tail currently measuring 3.4 by 2.9 by 2.4 cm. Although anatomic localization is hampered by the patient's extreme paucity of adipose tissue (making separation of adjacent structures difficult on noncontrast CT), there is thought to be a high likelihood tumor extending around the vicinity of the celiac trunk and possibly the superior  mesenteric artery. In this case, MRI might provide better anatomic localization with respect to perivascular involvement. 2. Potential mild left periaortic adenopathy, maximum SUV of the indistinct left periaortic lymph nodes is approximately 3.3 which is above blood pool levels. 3. Scattered ground-glass density nodules in the lungs favoring the upper lobes, with tree-in-bud nodularity in the lung bases favoring the lower lobes. None of these lesions are hypermetabolic. While possibilities include atypical infectious process drug reaction, surveillance of the chest is recommended to exclude low-grade adenocarcinoma. 4.  Aortic Atherosclerosis (ICD10-I70.0). Electronically Signed   By: Van Clines M.D.   On: 01/17/2019 15:34   [facility] Guided Port Insertion  Result Date: 01/16/2019 INDICATION: 38 year old with pancreatic cancer. Port-A-Cath needed for chemotherapy. EXAM: FLUOROSCOPIC AND ULTRASOUND GUIDED PLACEMENT OF A SUBCUTANEOUS PORT COMPARISON:  None. MEDICATIONS: Clindamycin 900 mg; The antibiotic was administered within an appropriate time interval prior to skin puncture. ANESTHESIA/SEDATION: Versed 4.0 mg IV; Fentanyl 100 mcg IV; Moderate Sedation Time:  35 minutes The patient was continuously monitored during the procedure by the interventional radiology nurse under my direct supervision. FLUOROSCOPY TIME:  18 seconds, 1 mGy COMPLICATIONS: None immediate. PROCEDURE: The procedure, risks, benefits, and alternatives were explained to the patient. Questions regarding the procedure were encouraged and answered. The patient understands and consents to the procedure. Patient was placed supine on the interventional table. Ultrasound confirmed a patent right internal jugular vein. Ultrasound image was saved for documentation. The right chest and neck were cleaned with a skin antiseptic and a sterile drape was placed. Maximal barrier sterile technique was utilized including caps, mask, sterile gowns,  sterile gloves, sterile drape, hand hygiene and skin antiseptic. The right neck was anesthetized with 1% lidocaine. Small incision was made in the right neck with a blade. Micropuncture set was placed in the right internal jugular vein with ultrasound guidance. The micropuncture wire was used for measurement purposes. The right chest was anesthetized with 1% lidocaine with epinephrine. #15 blade was used to make an incision and a subcutaneous port pocket was formed. Anthon was assembled. Subcutaneous tunnel was formed with a stiff tunneling device. The port catheter was brought through the subcutaneous tunnel. The port was placed in the subcutaneous pocket. The micropuncture set was exchanged for a peel-away sheath. The catheter was placed through the peel-away sheath and the tip was positioned at the SVC and right atrium junction. Catheter placement was confirmed with  fluoroscopy. The port was accessed and flushed with heparinized saline. The port pocket was closed using two layers of absorbable sutures and Dermabond. The vein skin site was closed using a single layer of absorbable suture and Dermabond. Sterile dressings were applied. Patient tolerated the procedure well without an immediate complication. Ultrasound and fluoroscopic images were taken and saved for this procedure. FINDINGS: Patent right internal jugular vein. Catheter tip at the SVC and right atrium junction. Large solid nodule identified in the right thyroid lobe and isthmus region. IMPRESSION: Placement of a subcutaneous port device. Catheter tip at the SVC and right atrium junction. **An incidental finding of potential clinical significance has been found. Thyroid nodule. Consider further characterization with a dedicated thyroid ultrasound.** These results will be called to the ordering clinician or representative by the Radiologist Assistant, and communication documented in the PACS or zVision Dashboard. Electronically Signed   By:  Markus Daft M.D.   On: 01/16/2019 15:56

## 2019-02-21 ENCOUNTER — Other Ambulatory Visit: Payer: Self-pay

## 2019-02-21 ENCOUNTER — Encounter: Payer: Self-pay | Admitting: Nutrition

## 2019-02-21 ENCOUNTER — Ambulatory Visit: Payer: Self-pay | Admitting: Hematology

## 2019-02-21 ENCOUNTER — Ambulatory Visit: Payer: Self-pay

## 2019-02-28 ENCOUNTER — Inpatient Hospital Stay: Payer: Medicaid Other

## 2019-02-28 ENCOUNTER — Encounter: Payer: Self-pay | Admitting: Hematology

## 2019-02-28 ENCOUNTER — Telehealth: Payer: Self-pay | Admitting: Hematology

## 2019-02-28 ENCOUNTER — Inpatient Hospital Stay (HOSPITAL_BASED_OUTPATIENT_CLINIC_OR_DEPARTMENT_OTHER): Payer: Medicaid Other | Admitting: Hematology

## 2019-02-28 ENCOUNTER — Other Ambulatory Visit: Payer: Self-pay

## 2019-02-28 VITALS — BP 114/71 | HR 67 | Temp 98.7°F | Resp 18 | Ht 68.0 in | Wt 93.2 lb

## 2019-02-28 DIAGNOSIS — C259 Malignant neoplasm of pancreas, unspecified: Secondary | ICD-10-CM

## 2019-02-28 DIAGNOSIS — D6481 Anemia due to antineoplastic chemotherapy: Secondary | ICD-10-CM

## 2019-02-28 DIAGNOSIS — T451X5A Adverse effect of antineoplastic and immunosuppressive drugs, initial encounter: Secondary | ICD-10-CM

## 2019-02-28 DIAGNOSIS — R197 Diarrhea, unspecified: Secondary | ICD-10-CM

## 2019-02-28 DIAGNOSIS — Z800 Family history of malignant neoplasm of digestive organs: Secondary | ICD-10-CM

## 2019-02-28 DIAGNOSIS — E46 Unspecified protein-calorie malnutrition: Secondary | ICD-10-CM

## 2019-02-28 DIAGNOSIS — Z5111 Encounter for antineoplastic chemotherapy: Secondary | ICD-10-CM | POA: Diagnosis not present

## 2019-02-28 DIAGNOSIS — Z95828 Presence of other vascular implants and grafts: Secondary | ICD-10-CM

## 2019-02-28 DIAGNOSIS — G893 Neoplasm related pain (acute) (chronic): Secondary | ICD-10-CM

## 2019-02-28 LAB — CBC WITH DIFFERENTIAL (CANCER CENTER ONLY)
Abs Immature Granulocytes: 0.01 10*3/uL (ref 0.00–0.07)
Basophils Absolute: 0 10*3/uL (ref 0.0–0.1)
Basophils Relative: 1 %
Eosinophils Absolute: 0.1 10*3/uL (ref 0.0–0.5)
Eosinophils Relative: 1 %
HCT: 34.6 % — ABNORMAL LOW (ref 39.0–52.0)
Hemoglobin: 11.4 g/dL — ABNORMAL LOW (ref 13.0–17.0)
Immature Granulocytes: 0 %
Lymphocytes Relative: 20 %
Lymphs Abs: 1.3 10*3/uL (ref 0.7–4.0)
MCH: 29.8 pg (ref 26.0–34.0)
MCHC: 32.9 g/dL (ref 30.0–36.0)
MCV: 90.3 fL (ref 80.0–100.0)
Monocytes Absolute: 0.5 10*3/uL (ref 0.1–1.0)
Monocytes Relative: 8 %
Neutro Abs: 4.5 10*3/uL (ref 1.7–7.7)
Neutrophils Relative %: 70 %
Platelet Count: 234 10*3/uL (ref 150–400)
RBC: 3.83 MIL/uL — ABNORMAL LOW (ref 4.22–5.81)
RDW: 15.9 % — ABNORMAL HIGH (ref 11.5–15.5)
WBC Count: 6.4 10*3/uL (ref 4.0–10.5)
nRBC: 0 % (ref 0.0–0.2)

## 2019-02-28 LAB — CMP (CANCER CENTER ONLY)
ALT: 166 U/L — ABNORMAL HIGH (ref 0–44)
AST: 63 U/L — ABNORMAL HIGH (ref 15–41)
Albumin: 3.6 g/dL (ref 3.5–5.0)
Alkaline Phosphatase: 77 U/L (ref 38–126)
Anion gap: 9 (ref 5–15)
BUN: 7 mg/dL (ref 6–20)
CO2: 27 mmol/L (ref 22–32)
Calcium: 8.6 mg/dL — ABNORMAL LOW (ref 8.9–10.3)
Chloride: 107 mmol/L (ref 98–111)
Creatinine: 0.63 mg/dL (ref 0.61–1.24)
GFR, Est AFR Am: >60 mL/min (ref >60)
GFR, Estimated: >60 mL/min (ref >60)
Glucose, Bld: 88 mg/dL (ref 70–99)
Potassium: 3.7 mmol/L (ref 3.5–5.1)
Sodium: 143 mmol/L (ref 135–145)
Total Bilirubin: 0.2 mg/dL — ABNORMAL LOW (ref 0.3–1.2)
Total Protein: 6.4 g/dL — ABNORMAL LOW (ref 6.5–8.1)

## 2019-02-28 MED ORDER — SODIUM CHLORIDE 0.9% FLUSH
10.0000 mL | INTRAVENOUS | Status: DC | PRN
  Administered 2019-02-28: 10 mL via INTRAVENOUS
  Filled 2019-02-28: qty 10

## 2019-02-28 MED ORDER — HEPARIN SOD (PORK) LOCK FLUSH 100 UNIT/ML IV SOLN
500.0000 [IU] | Freq: Once | INTRAVENOUS | Status: AC
  Administered 2019-02-28: 500 [IU] via INTRAVENOUS
  Filled 2019-02-28: qty 5

## 2019-02-28 NOTE — Progress Notes (Signed)
[facility] OFFICE PROGRESS NOTE  Patient Care Team: Kerin Perna, NP as PCP - General (Internal Medicine)  HEME/ONC OVERVIEW: 1. 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, unresectable  -S/p C1 of palliative gemcitabine/Abraxane -Labs adequate today, proceed with C2D1 of gemcitabine/Abraxane -I have ordered CT CAP after the 3rd cycle of chemotherapy to assess interim response (tentatively early 04/2019) 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 -Hgb 11.4, stable  -Patient denies any symptom of bleeding -We will monitor for now; no indication for dose adjustment  Transaminitis -Etiology unclear; AST and ALT elevated prior to starting chemotherapy, making chemotherapy-induced liver injury less likely; no liver mets on PET  -AST 63, ALT 166, stable; Tbili and alk phos normal -No dose adjustment indicated per pharmacy  -We will monitor it for now  Abdominal pain -Secondary to vascular and nerve involvement by pancreatic cancer  -Currently on MS-Contin 42m BID and IR morphine [id number] PRN for pain control  -Pain relatively well controlled -If the pain becomes worse in the future, we can consider celiac nerve block vs. SBRT   Chemotherapy-associated nausea  -Secondary to  chemotherapy -Symptoms relatively well controlled  -Continue PRN-anti-emetics   Diarrhea -Likely multifactorial, including pancreatic insufficiency and chemotherapy -Relatively small volume, no other associated symptoms -Patient has already completed the application for Creon, but no update yet  -I encouraged the patient to contact the [employer] representative to get an update on the status of his application -PRN Imodium   Protein malnutrition -Currently drinking 2 Ensure per day and able to eat and drink without limitation -Weight relatively stable -I encouraged the patient to increase Ensure to 3-4 bottles per day  -Continue follow-up with nutrition  Family history of malignancy -Patient declined genetic evaluation despite extensive family hx of malignancy -I strongly recommended the patient to consider genetic evaluation, especially as he has several young children -He would like to think more about it, and we will continue the discussion to assess his willingness to have the testing done  No orders of the defined types were placed in this encounter.  All questions were answered. The patient knows to call the clinic with any problems, questions or concerns. No barriers to learning was detected.  Return in 2 weeks for labs, port flush, and clinic appointment prior to C2D8 of chemotherapy.  YaTish MenMD 02/28/2019 9:57 AM  CHIEF COMPLAINT: "I am doing okay"  INTERVAL HISTORY: [facility]. [facility] clinic for follow-up of locally advanced pancreatic adenocarcinoma on palliative gemcitabine/Abraxane.  Patient reports that after the second treatment of chemotherapy, he had fatigue for 1 to 2 days and decreased appetite, but overall the intensity of the side effects was much less than after the first treatment.  He has some periodic nausea, for which he takes PRN anti-emetics with relief.  He takes MS-Contin 15 mg  BID and IR morphine 1-2 times a day with adequate pain control.  He is  currently drinking 2  ensure a day and able to eat and drink without limitation.  He still has daily, small-volume diarrhea, without any other associated symptoms.  He submitted his Creon application approximately a week ago, but has not yet received any update.  He denies any other complaint today.  REVIEW OF SYSTEMS:   Constitutional: ( - ) fevers, ( - )  chills , ( - ) night sweats Eyes: ( - ) blurriness of vision, ( - ) double vision, ( - ) watery eyes Ears, nose, mouth, throat, and face: ( - ) mucositis, ( - ) sore throat Respiratory: ( - ) cough, ( - ) dyspnea, ( - ) wheezes Cardiovascular: ( - ) palpitation, ( - ) chest discomfort, ( - ) lower extremity swelling Gastrointestinal:  ( + ) nausea, ( - ) heartburn, ( + ) change in bowel habits Skin: ( - ) abnormal skin rashes Lymphatics: ( - ) new lymphadenopathy, ( - ) easy bruising Neurological: ( - ) numbness, ( - ) tingling, ( - ) new weaknesses Behavioral/Psych: ( - ) mood change, ( - ) new changes  All other systems were reviewed with the patient and are negative.  SUMMARY OF ONCOLOGIC HISTORY: Oncology History  Pancreatic adenocarcinoma [facility])   Initial Diagnosis   Pancreatic adenocarcinoma ([place])   12/15/2018 Imaging   CT abdomen/pelvis w/ contrast: IMPRESSION: 1. Suggestion of a 1.5 cm mass within the mid aspect of the pancreatic body with associated atrophy of the upstream pancreatic tail and associated pancreatic ductal dilatation. Recommend further evaluation with MRI. There is soft tissue fullness around the superior mesenteric artery and celiac axis. This may represent edema or potentially tumor involvement. 2. Tree-in-bud nodularity within the peripheral aspect of the lower lobes bilaterally which may be secondary to an infectious or inflammatory process. Recommend follow-up chest CT in 3 months to assess for interval change. 3. These results will be called to the ordering clinician or representative by the  Radiologist Assistant, and communication documented in the PACS or zVision Dashboard.   12/29/2018 Procedure   EUS: -2.4 x 1.9cm mass in the body of the pancreas causing the main pancreatic duct obstruction and clearly involving the celiac trunk. Biopsied. -Heterogenous soft tissue mass in the neck, measuring 3.6cm maximally. This was not sampled, presumed to be a large thyroid gland.  -No peripancreatic adenopathy -CBD was normal, non-dilated.   12/29/2018 Pathology Results   CASE: [id number]   DIAGNOSIS:  - Malignant cells consistent with adenocarcinoma  - See comment.   01/17/2019 Imaging   PET: IMPRESSION: 1. Suspected enlargement of the pancreatic mass, with abnormal hypermetabolic activity at the junction of the pancreatic body and tail currently measuring 3.4 by 2.9 by 2.4 cm. Although anatomic localization is hampered by the patient's extreme paucity of adipose tissue (making separation of adjacent structures difficult on noncontrast CT), there is thought to be a high likelihood tumor extending around the vicinity of the celiac trunk and possibly the superior mesenteric artery. In this case, MRI might provide better anatomic localization with respect to perivascular involvement. 2. Potential mild left periaortic adenopathy, maximum SUV of the indistinct left periaortic lymph nodes is approximately 3.3 which is above blood pool levels. 3. Scattered ground-glass density nodules in the lungs favoring the upper lobes, with tree-in-bud nodularity in the lung bases favoring the lower lobes. None of these lesions are hypermetabolic. While possibilities include atypical infectious process drug reaction, surveillance of the chest is recommended to exclude low-grade adenocarcinoma. 4.  Aortic Atherosclerosis (ICD10-I70.0).  01/24/2019 -  Chemotherapy   The patient had PACLitaxel-protein bound (ABRAXANE) chemo infusion 150 mg, 100 mg/m2 = 150 mg (100 % of original dose 100  mg/m2), Intravenous,  Once, 1 of 4 cycles Dose modification: 100 mg/m2 (original dose 100 mg/m2, Cycle 1, Reason: Provider Judgment) Administration: 150 mg (01/24/2019), 150 mg (02/14/2019) gemcitabine (GEMZAR) 1,102 mg in sodium chloride 0.9 % 250 mL chemo infusion, 800 mg/m2 = 1,102 mg (100 % of original dose 800 mg/m2), Intravenous,  Once, 1 of 4 cycles Dose modification: 800 mg/m2 (original dose 800 mg/m2, Cycle 1, Reason: Provider Judgment) Administration: 1,102 mg (01/24/2019), 1,102 mg (02/14/2019)  for chemotherapy treatment.      I have reviewed the past medical history, past surgical history, social history and family history with the patient and they are unchanged from previous note.  ALLERGIES:  is allergic to penicillins.  MEDICATIONS:  Current Outpatient Medications  Medication Sig Dispense Refill  . cyclobenzaprine (FLEXERIL) 10 MG tablet Take 10 mg by mouth 3 (three) times daily as needed for muscle spasms.    . diphenhydramine-acetaminophen (TYLENOL PM) 25-500 MG TABS tablet Take 2 tablets by mouth at bedtime as needed (sleep).    Marland Kitchen lidocaine-prilocaine (EMLA) cream Apply to affected area once 30 g 3  . LORazepam (ATIVAN) 0.5 MG tablet Take 1 tablet (0.5 mg total) by mouth every 6 (six) hours as needed (Nausea or vomiting). 30 tablet 0  . morphine (MS CONTIN) 15 MG 12 hr tablet Take 1 tablet (15 mg total) by mouth every 12 (twelve) hours. 60 tablet 0  . morphine (MSIR) 15 MG tablet Take 1 tablet (15 mg total) by mouth every 6 (six) hours as needed for severe pain. 60 tablet 0  . ondansetron (ZOFRAN) 8 MG tablet Take 1 tablet (8 mg total) by mouth 2 (two) times daily as needed (Nausea or vomiting). 30 tablet 1  . polyethylene glycol (MIRALAX / GLYCOLAX) 17 g packet Take 17 g by mouth daily.    . prochlorperazine (COMPAZINE) 10 MG tablet Take 1 tablet (10 mg total) by mouth every 6 (six) hours as needed (Nausea or vomiting). 30 tablet 1  . senna-docusate (SENNA S) 8.6-50 MG  tablet Take 2 tablets by mouth at bedtime.     No current facility-administered medications for this visit.    Facility-Administered Medications Ordered in Other Visits  Medication Dose Route Frequency Provider Last Rate Last Dose  . oxyCODONE-acetaminophen (PERCOCET/ROXICET) 5-325 MG per tablet 2 tablet  2 tablet Oral Once Tish Men, MD        PHYSICAL EXAMINATION: ECOG PERFORMANCE STATUS: 2 - Symptomatic, <50% confined to bed  Today's Vitals   02/28/19 0952 02/28/19 0953  BP: 114/71   Pulse: 67   Resp: 18   Temp: 98.7 F (37.1 C)   TempSrc: Temporal   SpO2: 100%   Weight: 93 lb 3.2 oz (42.3 kg)   Height: _0  (1.727 m)   PainSc:  0-No pain   Body mass index is 14.17 kg/m.  Filed Weights   02/28/19 0952  Weight: 93 lb 3.2 oz (42.3 kg)    GENERAL: alert, no distress and comfortable, very thin SKIN: skin color, texture, turgor are normal, no rashes or significant lesions EYES: conjunctiva are pink and non-injected, sclera clear NECK: supple, non-tender LUNGS: clear to auscultation with normal breathing effort HEART: regular rate & rhythm and no murmurs and no lower extremity edema ABDOMEN: soft, non-tender, non-distended, normal bowel sounds Musculoskeletal: no cyanosis of digits and no  clubbing  PSYCH: alert & oriented x 3, fluent speech  LABORATORY DATA:  I have reviewed the data as listed    Component Value Date/Time   NA 139 02/14/2019 1104   K 3.6 02/14/2019 1104   CL 104 02/14/2019 1104   CO2 28 02/14/2019 1104   GLUCOSE 126 (H) 02/14/2019 1104   BUN 7 02/14/2019 1104   CREATININE 0.69 02/14/2019 1104   CALCIUM 8.7 (L) 02/14/2019 1104   PROT 6.3 (L) 02/14/2019 1104   ALBUMIN 3.5 02/14/2019 1104   AST 52 (H) 02/14/2019 1104   ALT 141 (H) 02/14/2019 1104   ALKPHOS 83 02/14/2019 1104   BILITOT <0.2 (L) 02/14/2019 1104   GFRNONAA >60 02/14/2019 1104   GFRAA >60 02/14/2019 1104    No results found for: SPEP, UPEP  Lab Results  Component Value Date    WBC 6.4 02/28/2019   NEUTROABS 4.5 02/28/2019   HGB 11.4 (L) 02/28/2019   HCT 34.6 (L) 02/28/2019   MCV 90.3 02/28/2019   PLT 234 02/28/2019      Chemistry      Component Value Date/Time   NA 139 02/14/2019 1104   K 3.6 02/14/2019 1104   CL 104 02/14/2019 1104   CO2 28 02/14/2019 1104   BUN 7 02/14/2019 1104   CREATININE 0.69 02/14/2019 1104      Component Value Date/Time   CALCIUM 8.7 (L) 02/14/2019 1104   ALKPHOS 83 02/14/2019 1104   AST 52 (H) 02/14/2019 1104   ALT 141 (H) 02/14/2019 1104   BILITOT <0.2 (L) 02/14/2019 1104       RADIOGRAPHIC STUDIES: I have personally reviewed the radiological images as listed below and agreed with the findings in the report. No results found.

## 2019-02-28 NOTE — Telephone Encounter (Signed)
Gave avs and calendar ° °

## 2019-03-02 ENCOUNTER — Other Ambulatory Visit: Payer: Self-pay

## 2019-03-02 ENCOUNTER — Inpatient Hospital Stay: Payer: Medicaid Other

## 2019-03-02 VITALS — BP 95/68 | HR 70 | Temp 98.7°F | Resp 16

## 2019-03-02 DIAGNOSIS — Z5111 Encounter for antineoplastic chemotherapy: Secondary | ICD-10-CM | POA: Diagnosis not present

## 2019-03-02 DIAGNOSIS — C259 Malignant neoplasm of pancreas, unspecified: Secondary | ICD-10-CM

## 2019-03-02 MED ORDER — HEPARIN SOD (PORK) LOCK FLUSH 100 UNIT/ML IV SOLN
500.0000 [IU] | Freq: Once | INTRAVENOUS | Status: AC | PRN
  Administered 2019-03-02: 500 [IU]
  Filled 2019-03-02: qty 5

## 2019-03-02 MED ORDER — PROCHLORPERAZINE MALEATE 10 MG PO TABS
ORAL_TABLET | ORAL | Status: AC
  Filled 2019-03-02: qty 1

## 2019-03-02 MED ORDER — SODIUM CHLORIDE 0.9 % IV SOLN
800.0000 mg/m2 | Freq: Once | INTRAVENOUS | Status: AC
  Administered 2019-03-02: 1102 mg via INTRAVENOUS
  Filled 2019-03-02: qty 28.98

## 2019-03-02 MED ORDER — SODIUM CHLORIDE 0.9% FLUSH
10.0000 mL | INTRAVENOUS | Status: DC | PRN
  Administered 2019-03-02: 10 mL
  Filled 2019-03-02: qty 10

## 2019-03-02 MED ORDER — SODIUM CHLORIDE 0.9 % IV SOLN
Freq: Once | INTRAVENOUS | Status: AC
  Administered 2019-03-02: 15:00:00 via INTRAVENOUS
  Filled 2019-03-02: qty 250

## 2019-03-02 MED ORDER — PROCHLORPERAZINE MALEATE 10 MG PO TABS
10.0000 mg | ORAL_TABLET | Freq: Once | ORAL | Status: AC
  Administered 2019-03-02: 10 mg via ORAL

## 2019-03-02 MED ORDER — PACLITAXEL PROTEIN-BOUND CHEMO INJECTION 100 MG
100.0000 mg/m2 | Freq: Once | INTRAVENOUS | Status: AC
  Administered 2019-03-02: 150 mg via INTRAVENOUS
  Filled 2019-03-02: qty 30

## 2019-03-02 NOTE — Patient Instructions (Signed)
[facility] Discharge Instructions for Patients Receiving Chemotherapy  Today you received the following chemotherapy agents: Abraxane and Gemzar   To help prevent nausea and vomiting after your treatment, we encourage you to take your nausea medication as directed.    If you develop nausea and vomiting that is not controlled by your nausea medication, call the clinic.   BELOW ARE SYMPTOMS THAT SHOULD BE REPORTED IMMEDIATELY:  *FEVER GREATER THAN 100.5 F  *CHILLS WITH OR WITHOUT FEVER  NAUSEA AND VOMITING THAT IS NOT CONTROLLED WITH YOUR NAUSEA MEDICATION  *UNUSUAL SHORTNESS OF BREATH  *UNUSUAL BRUISING OR BLEEDING  TENDERNESS IN MOUTH AND THROAT WITH OR WITHOUT PRESENCE OF ULCERS  *URINARY PROBLEMS  *BOWEL PROBLEMS  UNUSUAL RASH Items with * indicate a potential emergency and should be followed up as soon as possible.  Feel free to call the clinic should you have any questions or concerns. The clinic phone number is [phone].  Please show the CHEMO ALERT CARD at check-in to the Emergency Department and triage nurse.   

## 2019-03-06 ENCOUNTER — Telehealth: Payer: Self-pay | Admitting: *Deleted

## 2019-03-06 NOTE — Telephone Encounter (Signed)
TCT Milbert Coulter, for [employer] Assistance Program regarding pt's application status. No answer but was able to leave vm message requesting update on his application status.

## 2019-03-07 ENCOUNTER — Ambulatory Visit: Payer: Self-pay

## 2019-03-07 ENCOUNTER — Other Ambulatory Visit: Payer: Self-pay

## 2019-03-07 ENCOUNTER — Encounter: Payer: Self-pay | Admitting: Nutrition

## 2019-03-07 ENCOUNTER — Ambulatory Visit: Payer: Self-pay | Admitting: Hematology

## 2019-03-08 NOTE — Telephone Encounter (Signed)
Called Ann Bujobic @ [employer] again.  Left message on her voice mail checking on the status of pt's medication.  Left direct nurse line for Ann to call nurse back.

## 2019-03-09 ENCOUNTER — Encounter: Payer: Self-pay | Admitting: General Practice

## 2019-03-09 NOTE — Progress Notes (Signed)
[facility] Progress Notes  Email from [facility] - state that patient has missed phone and in person interviews scheduled to help him file for disability.  Called patient, encouraged him to call [facility] to reschedule, provided their contact information.  Edwyna Shell, LCSW Clinical Social Worker Phone:  [phone] Cell:  [phone]

## 2019-03-09 NOTE — Telephone Encounter (Signed)
Thank you for the update.  Dr. Maylon Peppers

## 2019-03-14 ENCOUNTER — Other Ambulatory Visit: Payer: Self-pay | Admitting: Hematology

## 2019-03-14 ENCOUNTER — Inpatient Hospital Stay: Payer: Medicaid Other

## 2019-03-14 ENCOUNTER — Telehealth: Payer: Self-pay | Admitting: Hematology

## 2019-03-14 ENCOUNTER — Inpatient Hospital Stay: Payer: Medicaid Other | Attending: Hematology | Admitting: Hematology

## 2019-03-14 ENCOUNTER — Telehealth: Payer: Self-pay | Admitting: *Deleted

## 2019-03-14 DIAGNOSIS — Z79899 Other long term (current) drug therapy: Secondary | ICD-10-CM | POA: Insufficient documentation

## 2019-03-14 DIAGNOSIS — G893 Neoplasm related pain (acute) (chronic): Secondary | ICD-10-CM | POA: Insufficient documentation

## 2019-03-14 DIAGNOSIS — C259 Malignant neoplasm of pancreas, unspecified: Secondary | ICD-10-CM

## 2019-03-14 DIAGNOSIS — D6481 Anemia due to antineoplastic chemotherapy: Secondary | ICD-10-CM | POA: Insufficient documentation

## 2019-03-14 DIAGNOSIS — Z5111 Encounter for antineoplastic chemotherapy: Secondary | ICD-10-CM | POA: Insufficient documentation

## 2019-03-14 DIAGNOSIS — R748 Abnormal levels of other serum enzymes: Secondary | ICD-10-CM | POA: Insufficient documentation

## 2019-03-14 DIAGNOSIS — C251 Malignant neoplasm of body of pancreas: Secondary | ICD-10-CM | POA: Insufficient documentation

## 2019-03-14 DIAGNOSIS — E46 Unspecified protein-calorie malnutrition: Secondary | ICD-10-CM | POA: Insufficient documentation

## 2019-03-14 DIAGNOSIS — T451X5A Adverse effect of antineoplastic and immunosuppressive drugs, initial encounter: Secondary | ICD-10-CM | POA: Insufficient documentation

## 2019-03-14 MED ORDER — MORPHINE SULFATE ER 15 MG PO TBCR: 15.0000 mg | EXTENDED_RELEASE_TABLET | Freq: Two times a day (BID) | ORAL | 0 refills | Status: DC

## 2019-03-14 MED ORDER — MORPHINE SULFATE 15 MG PO TABS: 15.0000 mg | ORAL_TABLET | Freq: Four times a day (QID) | ORAL | 0 refills | Status: DC | PRN

## 2019-03-14 NOTE — Telephone Encounter (Signed)
Scheduled appt per 1/29 sch message - unable to reach pt . Left message with appt date and time   

## 2019-03-14 NOTE — Telephone Encounter (Signed)
Spoke with girlfriend  Shirlean Mylar , and gave her pt's appt for Fri  03/16/19.  Pt to pick up new appt calendar for 03/28/19. Shirlean Mylar expressed that she will be going back to work soon, and pt will need transportation to and from [facility] for treatments and visits.  Informed Shirlean Mylar that message will be relayed to transportation coordinator. Robin's    Phone     [phone].

## 2019-03-15 NOTE — Telephone Encounter (Signed)
Thank you for contacting the patient.  Dr. Maylon Peppers

## 2019-03-16 ENCOUNTER — Inpatient Hospital Stay: Payer: Medicaid Other

## 2019-03-16 ENCOUNTER — Other Ambulatory Visit: Payer: Self-pay | Admitting: Hematology

## 2019-03-16 ENCOUNTER — Other Ambulatory Visit: Payer: Self-pay

## 2019-03-16 ENCOUNTER — Encounter: Payer: Self-pay | Admitting: Pharmacy Technician

## 2019-03-16 VITALS — BP 102/74 | HR 73 | Temp 98.7°F | Resp 16

## 2019-03-16 DIAGNOSIS — G893 Neoplasm related pain (acute) (chronic): Secondary | ICD-10-CM | POA: Diagnosis not present

## 2019-03-16 DIAGNOSIS — Z79899 Other long term (current) drug therapy: Secondary | ICD-10-CM | POA: Diagnosis not present

## 2019-03-16 DIAGNOSIS — T451X5A Adverse effect of antineoplastic and immunosuppressive drugs, initial encounter: Secondary | ICD-10-CM | POA: Diagnosis not present

## 2019-03-16 DIAGNOSIS — D6481 Anemia due to antineoplastic chemotherapy: Secondary | ICD-10-CM | POA: Diagnosis not present

## 2019-03-16 DIAGNOSIS — C259 Malignant neoplasm of pancreas, unspecified: Secondary | ICD-10-CM

## 2019-03-16 DIAGNOSIS — C251 Malignant neoplasm of body of pancreas: Secondary | ICD-10-CM | POA: Diagnosis present

## 2019-03-16 DIAGNOSIS — R748 Abnormal levels of other serum enzymes: Secondary | ICD-10-CM | POA: Diagnosis not present

## 2019-03-16 DIAGNOSIS — Z5111 Encounter for antineoplastic chemotherapy: Secondary | ICD-10-CM | POA: Diagnosis not present

## 2019-03-16 DIAGNOSIS — E46 Unspecified protein-calorie malnutrition: Secondary | ICD-10-CM | POA: Diagnosis not present

## 2019-03-16 DIAGNOSIS — E876 Hypokalemia: Secondary | ICD-10-CM

## 2019-03-16 LAB — CMP (CANCER CENTER ONLY)
ALT: 109 U/L — ABNORMAL HIGH (ref 0–44)
AST: 35 U/L (ref 15–41)
Albumin: 3.6 g/dL (ref 3.5–5.0)
Alkaline Phosphatase: 73 U/L (ref 38–126)
Anion gap: 6 (ref 5–15)
BUN: 6 mg/dL (ref 6–20)
CO2: 29 mmol/L (ref 22–32)
Calcium: 8.3 mg/dL — ABNORMAL LOW (ref 8.9–10.3)
Chloride: 104 mmol/L (ref 98–111)
Creatinine: 0.64 mg/dL (ref 0.61–1.24)
GFR, Est AFR Am: >60 mL/min (ref >60)
GFR, Estimated: >60 mL/min (ref >60)
Glucose, Bld: 91 mg/dL (ref 70–99)
Potassium: 3.1 mmol/L — ABNORMAL LOW (ref 3.5–5.1)
Sodium: 139 mmol/L (ref 135–145)
Total Bilirubin: 0.2 mg/dL — ABNORMAL LOW (ref 0.3–1.2)
Total Protein: 6.2 g/dL — ABNORMAL LOW (ref 6.5–8.1)

## 2019-03-16 LAB — CBC WITH DIFFERENTIAL (CANCER CENTER ONLY)
Abs Immature Granulocytes: 0.03 10*3/uL (ref 0.00–0.07)
Basophils Absolute: 0 10*3/uL (ref 0.0–0.1)
Basophils Relative: 1 %
Eosinophils Absolute: 0.1 10*3/uL (ref 0.0–0.5)
Eosinophils Relative: 1 %
HCT: 32.4 % — ABNORMAL LOW (ref 39.0–52.0)
Hemoglobin: 10.6 g/dL — ABNORMAL LOW (ref 13.0–17.0)
Immature Granulocytes: 1 %
Lymphocytes Relative: 7 %
Lymphs Abs: 0.4 10*3/uL — ABNORMAL LOW (ref 0.7–4.0)
MCH: 30.5 pg (ref 26.0–34.0)
MCHC: 32.7 g/dL (ref 30.0–36.0)
MCV: 93.4 fL (ref 80.0–100.0)
Monocytes Absolute: 0.6 10*3/uL (ref 0.1–1.0)
Monocytes Relative: 10 %
Neutro Abs: 4.6 10*3/uL (ref 1.7–7.7)
Neutrophils Relative %: 80 %
Platelet Count: 204 10*3/uL (ref 150–400)
RBC: 3.47 MIL/uL — ABNORMAL LOW (ref 4.22–5.81)
RDW: 15.2 % (ref 11.5–15.5)
WBC Count: 5.7 10*3/uL (ref 4.0–10.5)
nRBC: 0 % (ref 0.0–0.2)

## 2019-03-16 MED ORDER — POTASSIUM CHLORIDE CRYS ER 20 MEQ PO TBCR
40.0000 meq | EXTENDED_RELEASE_TABLET | Freq: Once | ORAL | Status: DC
  Filled 2019-03-16: qty 2

## 2019-03-16 MED ORDER — POTASSIUM CHLORIDE CRYS ER 20 MEQ PO TBCR: 20.0000 meq | EXTENDED_RELEASE_TABLET | Freq: Every day | ORAL | 3 refills | Status: DC

## 2019-03-16 MED ORDER — POTASSIUM CHLORIDE CRYS ER 20 MEQ PO TBCR: 40.0000 meq | EXTENDED_RELEASE_TABLET | Freq: Once | ORAL | Status: DC

## 2019-03-16 MED ORDER — HEPARIN SOD (PORK) LOCK FLUSH 100 UNIT/ML IV SOLN
500.0000 [IU] | Freq: Once | INTRAVENOUS | Status: AC | PRN
  Administered 2019-03-16: 500 [IU]
  Filled 2019-03-16: qty 5

## 2019-03-16 MED ORDER — PROCHLORPERAZINE MALEATE 10 MG PO TABS
ORAL_TABLET | ORAL | Status: AC
  Filled 2019-03-16: qty 1

## 2019-03-16 MED ORDER — SODIUM CHLORIDE 0.9 % IV SOLN
800.0000 mg/m2 | Freq: Once | INTRAVENOUS | Status: AC
  Administered 2019-03-16: 14:00:00 1102 mg via INTRAVENOUS
  Filled 2019-03-16: qty 28.98

## 2019-03-16 MED ORDER — PROCHLORPERAZINE MALEATE 10 MG PO TABS
10.0000 mg | ORAL_TABLET | Freq: Once | ORAL | Status: AC
  Administered 2019-03-16: 12:00:00 10 mg via ORAL

## 2019-03-16 MED ORDER — PACLITAXEL PROTEIN-BOUND CHEMO INJECTION 100 MG
100.0000 mg/m2 | Freq: Once | INTRAVENOUS | Status: AC
  Administered 2019-03-16: 14:00:00 150 mg via INTRAVENOUS
  Filled 2019-03-16: qty 30

## 2019-03-16 MED ORDER — SODIUM CHLORIDE 0.9% FLUSH
10.0000 mL | INTRAVENOUS | Status: DC | PRN
  Administered 2019-03-16: 15:00:00 10 mL
  Filled 2019-03-16: qty 10

## 2019-03-16 MED ORDER — SODIUM CHLORIDE 0.9 % IV SOLN
Freq: Once | INTRAVENOUS | Status: AC
  Administered 2019-03-16: 11:00:00 via INTRAVENOUS
  Filled 2019-03-16: qty 250

## 2019-03-16 NOTE — Progress Notes (Signed)
The patient is approved for drug assistance from [employer] for Abraxane. Enrollment is until 09/05/19 and is based on self-pay.  Replacement begins on DOS 01/24/19.

## 2019-03-16 NOTE — Progress Notes (Signed)
Okay to treat today with ALT 109, per Dr. Maylon Peppers.

## 2019-03-16 NOTE — Patient Instructions (Signed)
[facility] Discharge Instructions for Patients Receiving Chemotherapy  Today you received the following chemotherapy agents: Abraxane and Gemzar   To help prevent nausea and vomiting after your treatment, we encourage you to take your nausea medication as directed.    If you develop nausea and vomiting that is not controlled by your nausea medication, call the clinic.   BELOW ARE SYMPTOMS THAT SHOULD BE REPORTED IMMEDIATELY:  *FEVER GREATER THAN 100.5 F  *CHILLS WITH OR WITHOUT FEVER  NAUSEA AND VOMITING THAT IS NOT CONTROLLED WITH YOUR NAUSEA MEDICATION  *UNUSUAL SHORTNESS OF BREATH  *UNUSUAL BRUISING OR BLEEDING  TENDERNESS IN MOUTH AND THROAT WITH OR WITHOUT PRESENCE OF ULCERS  *URINARY PROBLEMS  *BOWEL PROBLEMS  UNUSUAL RASH Items with * indicate a potential emergency and should be followed up as soon as possible.  Feel free to call the clinic should you have any questions or concerns. The clinic phone number is [phone].  Please show the CHEMO ALERT CARD at check-in to the Emergency Department and triage nurse.   

## 2019-03-28 ENCOUNTER — Telehealth: Payer: Self-pay | Admitting: *Deleted

## 2019-03-28 ENCOUNTER — Ambulatory Visit: Payer: Self-pay

## 2019-03-28 ENCOUNTER — Ambulatory Visit: Payer: Self-pay | Admitting: Nurse Practitioner

## 2019-03-28 ENCOUNTER — Other Ambulatory Visit: Payer: Self-pay

## 2019-03-28 NOTE — Telephone Encounter (Addendum)
Left VM for patient to return call regarding his plans for appointment today. Was due 0900 for lab. Left VM for significant other, Robin to please contact patient and inform office of his plans for today.  @ 1046: appointments today canceled due to "no show".

## 2019-03-29 ENCOUNTER — Encounter: Payer: Self-pay | Admitting: General Practice

## 2019-03-29 NOTE — Progress Notes (Signed)
[facility] advised that they have filed patient's application for Social Security disability.  Patient should maintain contact with [facility] for any question/concerns about this process and be prepared to respond to any requests for information from [employer] in a timely manner.  Edwyna Shell, LCSW Clinical Social Worker Phone:  [phone] Cell:  [phone]

## 2019-04-09 ENCOUNTER — Ambulatory Visit (HOSPITAL_COMMUNITY): Admission: RE | Admit: 2019-04-09 | Payer: Self-pay | Source: Ambulatory Visit

## 2019-04-11 ENCOUNTER — Ambulatory Visit: Payer: Self-pay | Admitting: Hematology

## 2019-04-11 ENCOUNTER — Other Ambulatory Visit: Payer: Self-pay

## 2019-04-11 ENCOUNTER — Telehealth: Payer: Self-pay | Admitting: *Deleted

## 2019-04-11 NOTE — Telephone Encounter (Signed)
Pt was NO  SHOW today.  Pt was NO SHOW for CT scans on Mon 04/09/19.  Per Dr. Maylon Peppers, pt needs to repeat CT scans prior to rescheduling appts with MD since pt missed some chemo treatments. Called pt at home with no answers.  Called SO  Robin, and was informed that pt was not feeling well, had been sleeping a lot past day and a half.  Informed Shirlean Mylar that pt really needs to come in for appt today.  Robin replied " I can't make him come in ". Jerrel Ivory radiology phone number to reschedule CT scans.  Scans rescheduled for [facility] 04/16/19.  Gave Robin appts  for pt to have lab and visit with Dr. Maylon Peppers for  04/18/19.  Robin voiced understanding.  Schedule message sent.

## 2019-04-11 NOTE — Telephone Encounter (Signed)
Thank you.  Dr. Francyne Arreaga  

## 2019-04-12 ENCOUNTER — Telehealth: Payer: Self-pay | Admitting: *Deleted

## 2019-04-12 ENCOUNTER — Encounter: Payer: Self-pay | Admitting: General Practice

## 2019-04-12 NOTE — Telephone Encounter (Signed)
Called Dr. Salem Senate to inform about pt needing refill for MS Contin and MSIR . Called pt back to inform of refill. Per Dr. Salem Senate cancelled pt infusion appt until after CT scan. Changed pt preferred pharmacy [facility] [address] and [address].

## 2019-04-12 NOTE — Progress Notes (Signed)
[facility] CSW Progress Notes  Called pateint at request of Elray Buba, RN - states patient has declined transport help but has no viable means of transport to appointments.  Multiple no shows.  Called patient - he would like transport to appts on 1/13 - [employer] advised.  Also discussed need for patient to arrange his own ride to CT Scan on Mon 1/11 [facility] will not be available for this appt.as it is not at [facility].  Pt states he can find a ride.  Also asks that CSW help him get medications refilled - Dionne Milo RN worked on this issue.  Edwyna Shell, LCSW Clinical Social Worker Phone:  [phone]

## 2019-04-13 ENCOUNTER — Telehealth: Payer: Self-pay | Admitting: *Deleted

## 2019-04-13 ENCOUNTER — Other Ambulatory Visit: Payer: Self-pay | Admitting: Hematology

## 2019-04-13 ENCOUNTER — Telehealth: Payer: Self-pay

## 2019-04-13 ENCOUNTER — Encounter: Payer: Medicaid Other | Admitting: Nutrition

## 2019-04-13 ENCOUNTER — Ambulatory Visit: Payer: Self-pay

## 2019-04-13 DIAGNOSIS — C259 Malignant neoplasm of pancreas, unspecified: Secondary | ICD-10-CM

## 2019-04-13 MED ORDER — MORPHINE SULFATE 15 MG PO TABS: 15.0000 mg | ORAL_TABLET | Freq: Four times a day (QID) | ORAL | 0 refills | Status: DC | PRN

## 2019-04-13 MED ORDER — MORPHINE SULFATE ER 15 MG PO TBCR: 15.0000 mg | EXTENDED_RELEASE_TABLET | Freq: Two times a day (BID) | ORAL | 0 refills | Status: DC

## 2019-04-13 NOTE — Telephone Encounter (Signed)
Received voicemail stating that patient needs refills on his medications and that he would like for his medications to [facility] to [facility] [address] [facility]. Made Dr Lorette Ang nurse Drucie Ip RN aware of request.

## 2019-04-13 NOTE — Telephone Encounter (Signed)
Received vm message from pt requesting refills. He did not specify which medications.  TCT patient to ask which meds needed refilling. No answer but was able to leave vm message for pt to call back

## 2019-04-13 NOTE — Telephone Encounter (Signed)
TCT patient and spoke with him. Made him aware that his pain medications have been ref[facility] @ [facility] [address] and [address]. He voiced understanding.

## 2019-04-16 ENCOUNTER — Ambulatory Visit (HOSPITAL_COMMUNITY)
Admission: RE | Admit: 2019-04-16 | Discharge: 2019-04-16 | Disposition: A | Discharge status: Home / Self Care | Payer: Medicaid Other | Source: Ambulatory Visit | Attending: Hematology | Admitting: Hematology

## 2019-04-16 ENCOUNTER — Other Ambulatory Visit: Payer: Self-pay

## 2019-04-16 ENCOUNTER — Telehealth: Payer: Self-pay | Admitting: *Deleted

## 2019-04-16 ENCOUNTER — Other Ambulatory Visit: Payer: Self-pay | Admitting: Hematology

## 2019-04-16 ENCOUNTER — Encounter (HOSPITAL_COMMUNITY): Payer: Self-pay

## 2019-04-16 DIAGNOSIS — C259 Malignant neoplasm of pancreas, unspecified: Secondary | ICD-10-CM | POA: Diagnosis not present

## 2019-04-16 HISTORY — DX: Malignant (primary) neoplasm, unspecified: C80.1

## 2019-04-16 MED ORDER — IOHEXOL 300 MG/ML  SOLN
100.0000 mL | Freq: Once | INTRAMUSCULAR | Status: AC | PRN
  Administered 2019-04-16: 100 mL via INTRAVENOUS

## 2019-04-16 MED ORDER — SODIUM CHLORIDE (PF) 0.9 % IJ SOLN
INTRAMUSCULAR | Status: AC
  Filled 2019-04-16: qty 50

## 2019-04-16 MED ORDER — OXYCODONE-ACETAMINOPHEN 5-325 MG PO TABS: 1.0000 | ORAL_TABLET | Freq: Three times a day (TID) | ORAL | 0 refills | Status: DC | PRN

## 2019-04-16 NOTE — Telephone Encounter (Signed)
TCT pt's significant other, Robin , to make her aware of a medication issue with pt's MSIR.  Apparently his MSIR is back-ordered for 1 week.  Therefore, Dr. Maylon Peppers sent in 1 week of Percocet until [facility] is available.  Shirlean Mylar voiced understanding and will pick up the percocet today.

## 2019-04-18 ENCOUNTER — Inpatient Hospital Stay: Payer: Medicaid Other | Attending: Hematology

## 2019-04-18 ENCOUNTER — Encounter: Payer: Self-pay | Admitting: Hematology

## 2019-04-18 ENCOUNTER — Inpatient Hospital Stay: Payer: Medicaid Other

## 2019-04-18 ENCOUNTER — Inpatient Hospital Stay (HOSPITAL_BASED_OUTPATIENT_CLINIC_OR_DEPARTMENT_OTHER): Payer: Medicaid Other | Admitting: Hematology

## 2019-04-18 ENCOUNTER — Other Ambulatory Visit: Payer: Self-pay

## 2019-04-18 ENCOUNTER — Telehealth: Payer: Self-pay | Admitting: Hematology

## 2019-04-18 VITALS — BP 102/80 | HR 71 | Temp 98.9°F | Resp 18 | Ht 68.0 in | Wt 98.1 lb

## 2019-04-18 DIAGNOSIS — D6481 Anemia due to antineoplastic chemotherapy: Secondary | ICD-10-CM | POA: Insufficient documentation

## 2019-04-18 DIAGNOSIS — R197 Diarrhea, unspecified: Secondary | ICD-10-CM

## 2019-04-18 DIAGNOSIS — T451X5A Adverse effect of antineoplastic and immunosuppressive drugs, initial encounter: Secondary | ICD-10-CM | POA: Diagnosis not present

## 2019-04-18 DIAGNOSIS — R748 Abnormal levels of other serum enzymes: Secondary | ICD-10-CM | POA: Insufficient documentation

## 2019-04-18 DIAGNOSIS — C251 Malignant neoplasm of body of pancreas: Secondary | ICD-10-CM | POA: Insufficient documentation

## 2019-04-18 DIAGNOSIS — G893 Neoplasm related pain (acute) (chronic): Secondary | ICD-10-CM | POA: Diagnosis not present

## 2019-04-18 DIAGNOSIS — Z5111 Encounter for antineoplastic chemotherapy: Secondary | ICD-10-CM | POA: Diagnosis not present

## 2019-04-18 DIAGNOSIS — C259 Malignant neoplasm of pancreas, unspecified: Secondary | ICD-10-CM

## 2019-04-18 DIAGNOSIS — K909 Intestinal malabsorption, unspecified: Secondary | ICD-10-CM

## 2019-04-18 DIAGNOSIS — E46 Unspecified protein-calorie malnutrition: Secondary | ICD-10-CM | POA: Insufficient documentation

## 2019-04-18 DIAGNOSIS — Z79899 Other long term (current) drug therapy: Secondary | ICD-10-CM | POA: Diagnosis not present

## 2019-04-18 DIAGNOSIS — R109 Unspecified abdominal pain: Secondary | ICD-10-CM | POA: Diagnosis not present

## 2019-04-18 LAB — CBC WITH DIFFERENTIAL (CANCER CENTER ONLY)
Abs Immature Granulocytes: 0.02 10*3/uL (ref 0.00–0.07)
Basophils Absolute: 0 10*3/uL (ref 0.0–0.1)
Basophils Relative: 1 %
Eosinophils Absolute: 0 10*3/uL (ref 0.0–0.5)
Eosinophils Relative: 1 %
HCT: 38.5 % — ABNORMAL LOW (ref 39.0–52.0)
Hemoglobin: 12.8 g/dL — ABNORMAL LOW (ref 13.0–17.0)
Immature Granulocytes: 0 %
Lymphocytes Relative: 17 %
Lymphs Abs: 1 10*3/uL (ref 0.7–4.0)
MCH: 31.1 pg (ref 26.0–34.0)
MCHC: 33.2 g/dL (ref 30.0–36.0)
MCV: 93.7 fL (ref 80.0–100.0)
Monocytes Absolute: 0.5 10*3/uL (ref 0.1–1.0)
Monocytes Relative: 9 %
Neutro Abs: 4.1 10*3/uL (ref 1.7–7.7)
Neutrophils Relative %: 72 %
Platelet Count: 245 10*3/uL (ref 150–400)
RBC: 4.11 MIL/uL — ABNORMAL LOW (ref 4.22–5.81)
RDW: 14 % (ref 11.5–15.5)
WBC Count: 5.7 10*3/uL (ref 4.0–10.5)
nRBC: 0 % (ref 0.0–0.2)

## 2019-04-18 LAB — CMP (CANCER CENTER ONLY)
ALT: 42 U/L (ref 0–44)
AST: 19 U/L (ref 15–41)
Albumin: 4 g/dL (ref 3.5–5.0)
Alkaline Phosphatase: 68 U/L (ref 38–126)
Anion gap: 12 (ref 5–15)
BUN: 9 mg/dL (ref 6–20)
CO2: 27 mmol/L (ref 22–32)
Calcium: 9 mg/dL (ref 8.9–10.3)
Chloride: 101 mmol/L (ref 98–111)
Creatinine: 0.75 mg/dL (ref 0.61–1.24)
GFR, Est AFR Am: >60 mL/min (ref >60)
GFR, Estimated: >60 mL/min (ref >60)
Glucose, Bld: 115 mg/dL — ABNORMAL HIGH (ref 70–99)
Potassium: 3.9 mmol/L (ref 3.5–5.1)
Sodium: 140 mmol/L (ref 135–145)
Total Bilirubin: 0.3 mg/dL (ref 0.3–1.2)
Total Protein: 7 g/dL (ref 6.5–8.1)

## 2019-04-18 MED ORDER — SODIUM CHLORIDE 0.9% FLUSH
10.0000 mL | INTRAVENOUS | Status: DC | PRN
  Administered 2019-04-18: 12:00:00 10 mL via INTRAVENOUS
  Filled 2019-04-18: qty 10

## 2019-04-18 MED ORDER — HEPARIN SOD (PORK) LOCK FLUSH 100 UNIT/ML IV SOLN
500.0000 [IU] | Freq: Once | INTRAVENOUS | Status: AC
  Administered 2019-04-18: 12:00:00 500 [IU] via INTRAVENOUS
  Filled 2019-04-18: qty 5

## 2019-04-18 NOTE — Telephone Encounter (Signed)
Gave avs and calendar ° °

## 2019-04-18 NOTE — Progress Notes (Signed)
[facility] OFFICE PROGRESS NOTE  Patient Care Team: Kerin Perna, NP as PCP - General (Internal Medicine)  HEME/ONC OVERVIEW: 1. 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  In summary, CT after 2 cycles of chemotherapy showed overall stable disease, and there was no evidence of progressive or metastatic disease. -I reviewed imaging results in detail with the patient -While the CT results were encouraging, I strongly emphasized with the patient the importance of adhering to the treatment schedule in order to maximize the benefits of chemotherapy.  Patient expressed understanding, and agreed with the plan.   -He has had some difficulty with transportation, and I have requested the transportation service to reach out to the patient to assist with treatment adherence -I also offered the patient to resume treatment as soon as this week, but he requested to resume treatment next week due to lack of transportation -I will plan to see him every other treatment to monitor treatment-related toxicities -We will plan to repeat  scans after cycle 4 of chemotherapy, and if disease remains stable, we can consider SBRT to the pancreatic malignancy 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 -Hgb 12.8, stable  -Patient denies any symptom of bleeding -We will monitor for now; no indication for dose adjustment  Abdominal pain -Secondary to vascular and nerve involvement by pancreatic cancer  -Currently on MS-Contin 15mg  BID and IR morphine 15mg  q6hrs PRN for pain control  -Pain relatively well controlled -I encouraged patient to take MS-Contin apart from IR morphine in order to determine the frequency of requiring short acting pain medication -Continue the current regimen for now  Diarrhea -Likely multifactorial, including pancreatic insufficiency and chemotherapy -Relatively small volume, no other associated symptoms -Patient completed an application for Creon, but apparently the application was lost.  He had filled out a second application this week, and is awaiting approval -I encouraged the patient to contact the Creon representative to get an update on the status of his application -PRN Imodium for now   Protein malnutrition -Currently drinking 2-3 Ensure per day and able to eat and drink without limitation -Weight relatively stable -I encouraged the patient to maintain adequate nutrition, and continue follow-up with nutrition  Family history of malignancy -Patient declined genetic evaluation despite extensive family hx of malignancy -As genetic testing may have implications for both the patient's treatment options and screening for malignancy for his children, I strongly counseled the patient again today regarding the importance of genetic testing -Patient expressed understanding, and would like to think some more before making a decision  No orders of the defined types were placed in this encounter.  All questions were answered. The patient  knows to call the clinic with any problems, questions or concerns. No  barriers to learning was detected.  Return in 1 week to start Cycle 2 of gemcitabine/Abraxane, and in 3 weeks for chemotherapy and clinic appointment.  Tish Men, MD 1/13/202112:17 PM  CHIEF COMPLAINT: "I am doing okay so far"  INTERVAL HISTORY: Mr. Rebelo returns clinic for follow-up of pancreatic cancer on palliative chemotherapy.  Patient reports that he missed his treatment last week due to "not feeling well," but since then, the abdominal cramping sensation has resolved.  He also has had some transportation difficulty, which caused him to miss some treatments.  He is currently drinking Ensure, and eating/drinking well without any difficulty.  His weight is stable.  He takes MS-Contin twice a day as well as IR morphine twice a day (at the same time) with relatively adequate pain control.  He still has some small volume diarrhea.  He filled out an application for Creon several weeks ago, but apparently his application was lost.  He had to fill out a second application, and just turned within this week.  REVIEW OF SYSTEMS:   Constitutional: ( - ) fevers, ( - )  chills , ( - ) night sweats Eyes: ( - ) blurriness of vision, ( - ) double vision, ( - ) watery eyes Ears, nose, mouth, throat, and face: ( - ) mucositis, ( - ) sore throat Respiratory: ( - ) cough, ( - ) dyspnea, ( - ) wheezes Cardiovascular: ( - ) palpitation, ( - ) chest discomfort, ( - ) lower extremity swelling Gastrointestinal:  ( - ) nausea, ( - ) heartburn, ( + ) change in bowel habits Skin: ( - ) abnormal skin rashes Lymphatics: ( - ) new lymphadenopathy, ( - ) easy bruising Neurological: ( - ) numbness, ( - ) tingling, ( - ) new weaknesses Behavioral/Psych: ( - ) mood change, ( - ) new changes  All other systems were reviewed with the patient and are negative.  SUMMARY OF ONCOLOGIC HISTORY: Oncology History  Pancreatic adenocarcinoma [facility])    Initial Diagnosis   Pancreatic adenocarcinoma ([place])   12/15/2018 Imaging   CT abdomen/pelvis w/ contrast: IMPRESSION: 1. Suggestion of a 1.5 cm mass within the mid aspect of the pancreatic body with associated atrophy of the upstream pancreatic tail and associated pancreatic ductal dilatation. Recommend further evaluation with MRI. There is soft tissue fullness around the superior mesenteric artery and celiac axis. This may represent edema or potentially tumor involvement. 2. Tree-in-bud nodularity within the peripheral aspect of the lower lobes bilaterally which may be secondary to an infectious or inflammatory process. Recommend follow-up chest CT in 3 months to assess for interval change. 3. These results will be called to the ordering clinician or representative by the Radiologist Assistant, and communication documented in the PACS or zVision Dashboard.   12/29/2018 Procedure   EUS: -2.4 x 1.9cm mass in the body of the pancreas causing the main pancreatic duct obstruction and clearly involving the celiac trunk. Biopsied. -Heterogenous soft tissue mass in the neck, measuring 3.6cm maximally. This was not sampled, presumed to be a large thyroid gland.  -No peripancreatic adenopathy -CBD was normal, non-dilated.   12/29/2018 Pathology Results   CASE: [id number]   DIAGNOSIS:  - Malignant cells consistent with adenocarcinoma  - See comment.   01/17/2019 Imaging   PET: IMPRESSION: 1. Suspected enlargement of the pancreatic mass, with abnormal hypermetabolic activity at the junction of the pancreatic body and tail currently measuring 3.4 by 2.9 by 2.4 cm. Although anatomic localization is hampered by the  patient's extreme paucity of adipose tissue (making separation of adjacent structures difficult on noncontrast CT), there is thought to be a high likelihood tumor extending around the vicinity of the celiac trunk and possibly the superior mesenteric artery. In this case, MRI  might provide better anatomic localization with respect to perivascular involvement. 2. Potential mild left periaortic adenopathy, maximum SUV of the indistinct left periaortic lymph nodes is approximately 3.3 which is above blood pool levels. 3. Scattered ground-glass density nodules in the lungs favoring the upper lobes, with tree-in-bud nodularity in the lung bases favoring the lower lobes. None of these lesions are hypermetabolic. While possibilities include atypical infectious process drug reaction, surveillance of the chest is recommended to exclude low-grade adenocarcinoma. 4.  Aortic Atherosclerosis (ICD10-I70.0).   01/24/2019 -  Chemotherapy   The patient had PACLitaxel-protein bound (ABRAXANE) chemo infusion 150 mg, 100 mg/m2 = 150 mg (100 % of original dose 100 mg/m2), Intravenous,  Once, 2 of 6 cycles Dose modification: 100 mg/m2 (original dose 100 mg/m2, Cycle 1, Reason: Provider Judgment) Administration: 150 mg (01/24/2019), 150 mg (02/14/2019), 150 mg (03/02/2019), 150 mg (03/16/2019) gemcitabine (GEMZAR) 1,102 mg in sodium chloride 0.9 % 250 mL chemo infusion, 800 mg/m2 = 1,102 mg (100 % of original dose 800 mg/m2), Intravenous,  Once, 2 of 6 cycles Dose modification: 800 mg/m2 (original dose 800 mg/m2, Cycle 1, Reason: Provider Judgment) Administration: 1,102 mg (01/24/2019), 1,102 mg (02/14/2019), 1,102 mg (03/02/2019), 1,102 mg (03/16/2019)  for chemotherapy treatment.    02/28/2019 Cancer Staging   Staging form: Exocrine Pancreas, AJCC 8th Edition - Clinical: Stage IB (cT2, cN0, cM0) - Signed by Tish Men, MD on 02/28/2019     I have reviewed the past medical history, past surgical history, social history and family history with the patient and they are unchanged from previous note.  ALLERGIES:  is allergic to penicillins.  MEDICATIONS:  Current Outpatient Medications  Medication Sig Dispense Refill  . cyclobenzaprine (FLEXERIL) 10 MG tablet Take 10 mg by mouth  3 (three) times daily as needed for muscle spasms.    . diphenhydramine-acetaminophen (TYLENOL PM) 25-500 MG TABS tablet Take 2 tablets by mouth at bedtime as needed (sleep).    Marland Kitchen lidocaine-prilocaine (EMLA) cream Apply to affected area once 30 g 3  . LORazepam (ATIVAN) 0.5 MG tablet Take 1 tablet (0.5 mg total) by mouth every 6 (six) hours as needed (Nausea or vomiting). 30 tablet 0  . morphine (MS CONTIN) 15 MG 12 hr tablet Take 1 tablet (15 mg total) by mouth every 12 (twelve) hours. 60 tablet 0  . morphine (MSIR) 15 MG tablet Take 1 tablet (15 mg total) by mouth every 6 (six) hours as needed for severe pain. 60 tablet 0  . ondansetron (ZOFRAN) 8 MG tablet Take 1 tablet (8 mg total) by mouth 2 (two) times daily as needed (Nausea or vomiting). 30 tablet 1  . prochlorperazine (COMPAZINE) 10 MG tablet Take 1 tablet (10 mg total) by mouth every 6 (six) hours as needed (Nausea or vomiting). 30 tablet 1  . senna-docusate (SENNA S) 8.6-50 MG tablet Take 2 tablets by mouth at bedtime.    Marland Kitchen oxyCODONE-acetaminophen (PERCOCET/ROXICET) 5-325 MG tablet Take 1 tablet by mouth every 8 (eight) hours as needed for severe pain. (Patient not taking: Reported on 04/18/2019) 40 tablet 0  . polyethylene glycol (MIRALAX / GLYCOLAX) 17 g packet Take 17 g by mouth daily.    . potassium chloride SA (KLOR-CON) 20 MEQ tablet Take 1 tablet (20  mEq total) by mouth daily. 30 tablet 3   No current facility-administered medications for this visit.   Facility-Administered Medications Ordered in Other Visits  Medication Dose Route Frequency Provider Last Rate Last Admin  . oxyCODONE-acetaminophen (PERCOCET/ROXICET) 5-325 MG per tablet 2 tablet  2 tablet Oral Once Tish Men, MD      . potassium chloride SA (KLOR-CON) CR tablet 40 mEq  40 mEq Oral Once Tish Men, MD        PHYSICAL EXAMINATION: ECOG PERFORMANCE STATUS: 2 - Symptomatic, <50% confined to bed  Today's Vitals   04/18/19 1210 04/18/19 1213  BP: 102/80   Pulse:  71   Resp: 18   Temp: 98.9 F (37.2 C)   TempSrc: Temporal   SpO2: 100%   Weight: 98 lb 1.6 oz (44.5 kg)   Height: 5\' 8"  (1.727 m)   PainSc:  0-No pain   Body mass index is 14.92 kg/m.  Filed Weights   04/18/19 1210  Weight: 98 lb 1.6 oz (44.5 kg)    GENERAL: alert, no distress and comfortable, very thin, sitting in a wheelchair  SKIN: skin color, texture, turgor are normal, no rashes or significant lesions EYES: conjunctiva are pink and non-injected, sclera clear OROPHARYNX: no exudate, no erythema; lips, buccal mucosa, and tongue normal  NECK: supple, non-tender LUNGS: clear to auscultation with normal breathing effort HEART: regular rate & rhythm and no murmurs and no lower extremity edema ABDOMEN: soft, non-tender, non-distended, normal bowel sounds Musculoskeletal: no cyanosis of digits and no clubbing  PSYCH: alert & oriented x 3, fluent speech  LABORATORY DATA:  I have reviewed the data as listed    Component Value Date/Time   NA 139 03/16/2019 1111   K 3.1 (L) 03/16/2019 1111   CL 104 03/16/2019 1111   CO2 29 03/16/2019 1111   GLUCOSE 91 03/16/2019 1111   BUN 6 03/16/2019 1111   CREATININE 0.64 03/16/2019 1111   CALCIUM 8.3 (L) 03/16/2019 1111   PROT 6.2 (L) 03/16/2019 1111   ALBUMIN 3.6 03/16/2019 1111   AST 35 03/16/2019 1111   ALT 109 (H) 03/16/2019 1111   ALKPHOS 73 03/16/2019 1111   BILITOT 0.2 (L) 03/16/2019 1111   GFRNONAA >60 03/16/2019 1111   GFRAA >60 03/16/2019 1111    No results found for: SPEP, UPEP  Lab Results  Component Value Date   WBC 5.7 03/16/2019   NEUTROABS 4.6 03/16/2019   HGB 10.6 (L) 03/16/2019   HCT 32.4 (L) 03/16/2019   MCV 93.4 03/16/2019   PLT 204 03/16/2019      Chemistry      Component Value Date/Time   NA 139 03/16/2019 1111   K 3.1 (L) 03/16/2019 1111   CL 104 03/16/2019 1111   CO2 29 03/16/2019 1111   BUN 6 03/16/2019 1111   CREATININE 0.64 03/16/2019 1111      Component Value Date/Time   CALCIUM 8.3  (L) 03/16/2019 1111   ALKPHOS 73 03/16/2019 1111   AST 35 03/16/2019 1111   ALT 109 (H) 03/16/2019 1111   BILITOT 0.2 (L) 03/16/2019 1111       RADIOGRAPHIC STUDIES: I have personally reviewed the radiological images as listed below and agreed with the findings in the report. CT chest w/ contrast  Result Date: 04/16/2019 CLINICAL DATA:  Pancreatic adenocarcinoma. EXAM: CT CHEST, ABDOMEN, AND PELVIS WITH CONTRAST TECHNIQUE: Multidetector CT imaging of the chest, abdomen and pelvis was performed following the standard protocol during bolus administration of intravenous contrast. CONTRAST:  116mL OMNIPAQUE IOHEXOL 300 MG/ML  SOLN COMPARISON:  PET-CT 01/17/2019 FINDINGS: CT CHEST FINDINGS Cardiovascular: Normal heart size. No pericardial effusion identified. Mediastinum/Nodes: 2.9 cm heterogeneous nodule within right lobe of thyroid gland is identified, image 22/7. The trachea appears patent and is midline. Normal appearance of the esophagus. No adenopathy Lungs/Pleura: No pleural effusion multiple pulmonary nodules are scattered throughout both lungs with a lower lung zone predominance. -index sub solid nodule within the anterolateral left upper lobe measures 1.1 cm, image 56/12. Unchanged. Index subpleural nodule within the perifissural left lower lobe measures 1.2 cm, image 93/12. Unchanged from previous exam. Bilateral peripheral and lower lung zone predominant tree-in-bud nodules are similar to previous exam, image 144/12 and image 147/12. Musculoskeletal: No chest wall mass or suspicious bone lesions identified. CT ABDOMEN PELVIS FINDINGS Hepatobiliary: Stable 7 mm low-density segment 7 liver lesion. Focal area of low attenuation about the falciform ligament is identified, image 115/7. Favor focal fatty deposition. Gallbladder negative. No significant biliary ductal dilatation. Pancreas: There is atrophy of the tail of pancreas with dilatation of the mid and distal main pancreatic duct. The margins of  the pancreas tumor is very difficult to identified. Infiltrative soft tissue into the retroperitoneum around the celiac trunk and SMA is again noted. Narrowing of the portal venous confluence by tumor is noted which appears progressive from 12/15/2018. Spleen: Normal in size without focal abnormality. Adrenals/Urinary Tract: Normal appearance of the adrenal glands. The kidneys are unremarkable. No mass or hydronephrosis. Urinary bladder appears within normal limits. Stomach/Bowel: Stomach is within normal limits. No evidence of bowel wall thickening, distention, or inflammatory changes. Vascular/Lymphatic: Aortic atherosclerosis. No adenopathy. Reproductive: Prostate is unremarkable. Other: No abdominal wall hernia or abnormality. No abdominopelvic ascites. Musculoskeletal: No acute or significant osseous findings. IMPRESSION: 1. Margins of pancreatic neoplasm are difficult to identify. Similar appearance of soft tissue infiltration within the retroperitoneum with progressive narrowing of the portal venous confluence by tumor. No specific findings of solid organ metastasis or nodal metastasis within the abdomen or pelvis. 2. Unchanged appearance of multifocal sub solid nodules within the upper lung zones and bilateral lower lung zone tree-in-bud nodularity. As mentioned previously findings are nonspecific and may be inflammatory or infectious in etiology. Metastatic disease is not excluded. 3. Aortic atherosclerosis. 4. Right lobe of thyroid gland nodule. Consider further evaluation with thyroid ultrasound. If patient is clinically hyperthyroid, consider nuclear medicine thyroid uptake and scan. Aortic Atherosclerosis (ICD10-I70.0). Electronically Signed   By: Kerby Moors M.D.   On: 04/16/2019 15:28   CT AP w/ contrast  Result Date: 04/16/2019 CLINICAL DATA:  Pancreatic adenocarcinoma. EXAM: CT CHEST, ABDOMEN, AND PELVIS WITH CONTRAST TECHNIQUE: Multidetector CT imaging of the chest, abdomen and pelvis was  performed following the standard protocol during bolus administration of intravenous contrast. CONTRAST:  199mL OMNIPAQUE IOHEXOL 300 MG/ML  SOLN COMPARISON:  PET-CT 01/17/2019 FINDINGS: CT CHEST FINDINGS Cardiovascular: Normal heart size. No pericardial effusion identified. Mediastinum/Nodes: 2.9 cm heterogeneous nodule within right lobe of thyroid gland is identified, image 22/7. The trachea appears patent and is midline. Normal appearance of the esophagus. No adenopathy Lungs/Pleura: No pleural effusion multiple pulmonary nodules are scattered throughout both lungs with a lower lung zone predominance. -index sub solid nodule within the anterolateral left upper lobe measures 1.1 cm, image 56/12. Unchanged. Index subpleural nodule within the perifissural left lower lobe measures 1.2 cm, image 93/12. Unchanged from previous exam. Bilateral peripheral and lower lung zone predominant tree-in-bud nodules are similar to previous exam, image 144/12 and image  147/12. Musculoskeletal: No chest wall mass or suspicious bone lesions identified. CT ABDOMEN PELVIS FINDINGS Hepatobiliary: Stable 7 mm low-density segment 7 liver lesion. Focal area of low attenuation about the falciform ligament is identified, image 115/7. Favor focal fatty deposition. Gallbladder negative. No significant biliary ductal dilatation. Pancreas: There is atrophy of the tail of pancreas with dilatation of the mid and distal main pancreatic duct. The margins of the pancreas tumor is very difficult to identified. Infiltrative soft tissue into the retroperitoneum around the celiac trunk and SMA is again noted. Narrowing of the portal venous confluence by tumor is noted which appears progressive from 12/15/2018. Spleen: Normal in size without focal abnormality. Adrenals/Urinary Tract: Normal appearance of the adrenal glands. The kidneys are unremarkable. No mass or hydronephrosis. Urinary bladder appears within normal limits. Stomach/Bowel: Stomach is  within normal limits. No evidence of bowel wall thickening, distention, or inflammatory changes. Vascular/Lymphatic: Aortic atherosclerosis. No adenopathy. Reproductive: Prostate is unremarkable. Other: No abdominal wall hernia or abnormality. No abdominopelvic ascites. Musculoskeletal: No acute or significant osseous findings. IMPRESSION: 1. Margins of pancreatic neoplasm are difficult to identify. Similar appearance of soft tissue infiltration within the retroperitoneum with progressive narrowing of the portal venous confluence by tumor. No specific findings of solid organ metastasis or nodal metastasis within the abdomen or pelvis. 2. Unchanged appearance of multifocal sub solid nodules within the upper lung zones and bilateral lower lung zone tree-in-bud nodularity. As mentioned previously findings are nonspecific and may be inflammatory or infectious in etiology. Metastatic disease is not excluded. 3. Aortic atherosclerosis. 4. Right lobe of thyroid gland nodule. Consider further evaluation with thyroid ultrasound. If patient is clinically hyperthyroid, consider nuclear medicine thyroid uptake and scan. Aortic Atherosclerosis (ICD10-I70.0). Electronically Signed   By: Kerby Moors M.D.   On: 04/16/2019 15:28

## 2019-04-19 ENCOUNTER — Telehealth: Payer: Self-pay | Admitting: Hematology

## 2019-04-19 NOTE — Telephone Encounter (Signed)
I left a message regarding schedule  

## 2019-04-26 ENCOUNTER — Other Ambulatory Visit: Payer: Self-pay

## 2019-04-26 ENCOUNTER — Inpatient Hospital Stay: Payer: Medicaid Other

## 2019-04-26 VITALS — BP 107/88 | HR 72 | Temp 99.1°F | Resp 17 | Wt 101.0 lb

## 2019-04-26 DIAGNOSIS — Z5111 Encounter for antineoplastic chemotherapy: Secondary | ICD-10-CM | POA: Diagnosis not present

## 2019-04-26 DIAGNOSIS — C259 Malignant neoplasm of pancreas, unspecified: Secondary | ICD-10-CM

## 2019-04-26 DIAGNOSIS — Z95828 Presence of other vascular implants and grafts: Secondary | ICD-10-CM

## 2019-04-26 LAB — CMP (CANCER CENTER ONLY)
ALT: 49 U/L — ABNORMAL HIGH (ref 0–44)
AST: 19 U/L (ref 15–41)
Albumin: 4 g/dL (ref 3.5–5.0)
Alkaline Phosphatase: 68 U/L (ref 38–126)
Anion gap: 8 (ref 5–15)
BUN: 7 mg/dL (ref 6–20)
CO2: 28 mmol/L (ref 22–32)
Calcium: 8.6 mg/dL — ABNORMAL LOW (ref 8.9–10.3)
Chloride: 104 mmol/L (ref 98–111)
Creatinine: 0.72 mg/dL (ref 0.61–1.24)
GFR, Est AFR Am: >60 mL/min (ref >60)
GFR, Estimated: >60 mL/min (ref >60)
Glucose, Bld: 86 mg/dL (ref 70–99)
Potassium: 3.8 mmol/L (ref 3.5–5.1)
Sodium: 140 mmol/L (ref 135–145)
Total Bilirubin: 0.3 mg/dL (ref 0.3–1.2)
Total Protein: 6.7 g/dL (ref 6.5–8.1)

## 2019-04-26 LAB — CBC WITH DIFFERENTIAL (CANCER CENTER ONLY)
Abs Immature Granulocytes: 0.01 10*3/uL (ref 0.00–0.07)
Basophils Absolute: 0.1 10*3/uL (ref 0.0–0.1)
Basophils Relative: 1 %
Eosinophils Absolute: 0.2 10*3/uL (ref 0.0–0.5)
Eosinophils Relative: 4 %
HCT: 38.1 % — ABNORMAL LOW (ref 39.0–52.0)
Hemoglobin: 12.3 g/dL — ABNORMAL LOW (ref 13.0–17.0)
Immature Granulocytes: 0 %
Lymphocytes Relative: 18 %
Lymphs Abs: 1 10*3/uL (ref 0.7–4.0)
MCH: 30.4 pg (ref 26.0–34.0)
MCHC: 32.3 g/dL (ref 30.0–36.0)
MCV: 94.3 fL (ref 80.0–100.0)
Monocytes Absolute: 0.5 10*3/uL (ref 0.1–1.0)
Monocytes Relative: 9 %
Neutro Abs: 3.7 10*3/uL (ref 1.7–7.7)
Neutrophils Relative %: 68 %
Platelet Count: 221 10*3/uL (ref 150–400)
RBC: 4.04 MIL/uL — ABNORMAL LOW (ref 4.22–5.81)
RDW: 13.7 % (ref 11.5–15.5)
WBC Count: 5.4 10*3/uL (ref 4.0–10.5)
nRBC: 0 % (ref 0.0–0.2)

## 2019-04-26 MED ORDER — PACLITAXEL PROTEIN-BOUND CHEMO INJECTION 100 MG
100.0000 mg/m2 | Freq: Once | INTRAVENOUS | Status: AC
  Administered 2019-04-26: 150 mg via INTRAVENOUS
  Filled 2019-04-26: qty 30

## 2019-04-26 MED ORDER — PROCHLORPERAZINE MALEATE 10 MG PO TABS
10.0000 mg | ORAL_TABLET | Freq: Once | ORAL | Status: AC
  Administered 2019-04-26: 10:00:00 10 mg via ORAL

## 2019-04-26 MED ORDER — SODIUM CHLORIDE 0.9% FLUSH
10.0000 mL | INTRAVENOUS | Status: DC | PRN
  Administered 2019-04-26: 10 mL
  Filled 2019-04-26: qty 10

## 2019-04-26 MED ORDER — SODIUM CHLORIDE 0.9 % IV SOLN
800.0000 mg/m2 | Freq: Once | INTRAVENOUS | Status: AC
  Administered 2019-04-26: 12:00:00 1102 mg via INTRAVENOUS
  Filled 2019-04-26: qty 28.98

## 2019-04-26 MED ORDER — SODIUM CHLORIDE 0.9% FLUSH
10.0000 mL | Freq: Once | INTRAVENOUS | Status: AC
  Administered 2019-04-26: 09:00:00 10 mL
  Filled 2019-04-26: qty 10

## 2019-04-26 MED ORDER — SODIUM CHLORIDE 0.9 % IV SOLN
Freq: Once | INTRAVENOUS | Status: AC
  Filled 2019-04-26: qty 250

## 2019-04-26 MED ORDER — PROCHLORPERAZINE MALEATE 10 MG PO TABS
ORAL_TABLET | ORAL | Status: AC
  Filled 2019-04-26: qty 1

## 2019-04-26 MED ORDER — HEPARIN SOD (PORK) LOCK FLUSH 100 UNIT/ML IV SOLN
500.0000 [IU] | Freq: Once | INTRAVENOUS | Status: AC | PRN
  Administered 2019-04-26: 12:00:00 500 [IU]
  Filled 2019-04-26: qty 5

## 2019-04-26 NOTE — Patient Instructions (Signed)

## 2019-04-26 NOTE — Patient Instructions (Signed)
[facility] Discharge Instructions for Patients Receiving Chemotherapy  Today you received the following chemotherapy agents: Abraxane/Gemzar.  To help prevent nausea and vomiting after your treatment, we encourage you to take your nausea medication as directed.   If you develop nausea and vomiting that is not controlled by your nausea medication, call the clinic.   BELOW ARE SYMPTOMS THAT SHOULD BE REPORTED IMMEDIATELY:  *FEVER GREATER THAN 100.5 F  *CHILLS WITH OR WITHOUT FEVER  NAUSEA AND VOMITING THAT IS NOT CONTROLLED WITH YOUR NAUSEA MEDICATION  *UNUSUAL SHORTNESS OF BREATH  *UNUSUAL BRUISING OR BLEEDING  TENDERNESS IN MOUTH AND THROAT WITH OR WITHOUT PRESENCE OF ULCERS  *URINARY PROBLEMS  *BOWEL PROBLEMS  UNUSUAL RASH Items with * indicate a potential emergency and should be followed up as soon as possible.  Feel free to call the clinic should you have any questions or concerns. The clinic phone number is [phone].  Please show the CHEMO ALERT CARD at check-in to the Emergency Department and triage nurse.   

## 2019-05-09 ENCOUNTER — Inpatient Hospital Stay: Payer: Medicaid Other

## 2019-05-09 ENCOUNTER — Other Ambulatory Visit: Payer: Self-pay

## 2019-05-09 ENCOUNTER — Inpatient Hospital Stay: Payer: Medicaid Other | Attending: Hematology

## 2019-05-09 ENCOUNTER — Inpatient Hospital Stay: Payer: Medicaid Other | Admitting: Nutrition

## 2019-05-09 ENCOUNTER — Inpatient Hospital Stay (HOSPITAL_BASED_OUTPATIENT_CLINIC_OR_DEPARTMENT_OTHER): Payer: Medicaid Other | Admitting: Hematology

## 2019-05-09 ENCOUNTER — Encounter: Payer: Self-pay | Admitting: Hematology

## 2019-05-09 VITALS — BP 111/84 | HR 102 | Temp 99.1°F | Resp 18 | Ht 68.0 in | Wt 95.0 lb

## 2019-05-09 VITALS — HR 81

## 2019-05-09 DIAGNOSIS — T451X5A Adverse effect of antineoplastic and immunosuppressive drugs, initial encounter: Secondary | ICD-10-CM | POA: Insufficient documentation

## 2019-05-09 DIAGNOSIS — E46 Unspecified protein-calorie malnutrition: Secondary | ICD-10-CM | POA: Insufficient documentation

## 2019-05-09 DIAGNOSIS — D6481 Anemia due to antineoplastic chemotherapy: Secondary | ICD-10-CM | POA: Diagnosis not present

## 2019-05-09 DIAGNOSIS — C251 Malignant neoplasm of body of pancreas: Secondary | ICD-10-CM | POA: Insufficient documentation

## 2019-05-09 DIAGNOSIS — G893 Neoplasm related pain (acute) (chronic): Secondary | ICD-10-CM

## 2019-05-09 DIAGNOSIS — Z5111 Encounter for antineoplastic chemotherapy: Secondary | ICD-10-CM | POA: Diagnosis not present

## 2019-05-09 DIAGNOSIS — Z79899 Other long term (current) drug therapy: Secondary | ICD-10-CM | POA: Insufficient documentation

## 2019-05-09 DIAGNOSIS — R109 Unspecified abdominal pain: Secondary | ICD-10-CM | POA: Diagnosis not present

## 2019-05-09 DIAGNOSIS — C259 Malignant neoplasm of pancreas, unspecified: Secondary | ICD-10-CM

## 2019-05-09 DIAGNOSIS — Z95828 Presence of other vascular implants and grafts: Secondary | ICD-10-CM

## 2019-05-09 LAB — CBC WITH DIFFERENTIAL (CANCER CENTER ONLY)
Abs Immature Granulocytes: 0.01 10*3/uL (ref 0.00–0.07)
Basophils Absolute: 0 10*3/uL (ref 0.0–0.1)
Basophils Relative: 1 %
Eosinophils Absolute: 0.1 10*3/uL (ref 0.0–0.5)
Eosinophils Relative: 2 %
HCT: 39.7 % (ref 39.0–52.0)
Hemoglobin: 13.1 g/dL (ref 13.0–17.0)
Immature Granulocytes: 0 %
Lymphocytes Relative: 30 %
Lymphs Abs: 1.3 10*3/uL (ref 0.7–4.0)
MCH: 30.7 pg (ref 26.0–34.0)
MCHC: 33 g/dL (ref 30.0–36.0)
MCV: 93 fL (ref 80.0–100.0)
Monocytes Absolute: 0.3 10*3/uL (ref 0.1–1.0)
Monocytes Relative: 7 %
Neutro Abs: 2.5 10*3/uL (ref 1.7–7.7)
Neutrophils Relative %: 60 %
Platelet Count: 288 10*3/uL (ref 150–400)
RBC: 4.27 MIL/uL (ref 4.22–5.81)
RDW: 13.1 % (ref 11.5–15.5)
WBC Count: 4.2 10*3/uL (ref 4.0–10.5)
nRBC: 0 % (ref 0.0–0.2)

## 2019-05-09 LAB — CMP (CANCER CENTER ONLY)
ALT: 44 U/L (ref 0–44)
AST: 19 U/L (ref 15–41)
Albumin: 3.8 g/dL (ref 3.5–5.0)
Alkaline Phosphatase: 69 U/L (ref 38–126)
Anion gap: 10 (ref 5–15)
BUN: 7 mg/dL (ref 6–20)
CO2: 28 mmol/L (ref 22–32)
Calcium: 9.1 mg/dL (ref 8.9–10.3)
Chloride: 101 mmol/L (ref 98–111)
Creatinine: 0.76 mg/dL (ref 0.61–1.24)
GFR, Est AFR Am: >60 mL/min (ref >60)
GFR, Estimated: >60 mL/min (ref >60)
Glucose, Bld: 91 mg/dL (ref 70–99)
Potassium: 3.4 mmol/L — ABNORMAL LOW (ref 3.5–5.1)
Sodium: 139 mmol/L (ref 135–145)
Total Bilirubin: 0.4 mg/dL (ref 0.3–1.2)
Total Protein: 7 g/dL (ref 6.5–8.1)

## 2019-05-09 MED ORDER — PROCHLORPERAZINE MALEATE 10 MG PO TABS
10.0000 mg | ORAL_TABLET | Freq: Once | ORAL | Status: AC
  Administered 2019-05-09: 10 mg via ORAL

## 2019-05-09 MED ORDER — MORPHINE SULFATE 15 MG PO TABS: 15.0000 mg | ORAL_TABLET | Freq: Four times a day (QID) | ORAL | 0 refills | Status: DC | PRN

## 2019-05-09 MED ORDER — SODIUM CHLORIDE 0.9 % IV SOLN
1000.0000 mg/m2 | Freq: Once | INTRAVENOUS | Status: AC
  Administered 2019-05-09: 1406 mg via INTRAVENOUS
  Filled 2019-05-09: qty 36.98

## 2019-05-09 MED ORDER — SODIUM CHLORIDE 0.9% FLUSH
10.0000 mL | INTRAVENOUS | Status: DC | PRN
  Administered 2019-05-09: 10 mL
  Filled 2019-05-09: qty 10

## 2019-05-09 MED ORDER — SODIUM CHLORIDE 0.9% FLUSH
10.0000 mL | INTRAVENOUS | Status: DC | PRN
  Administered 2019-05-09: 10 mL via INTRAVENOUS
  Filled 2019-05-09: qty 10

## 2019-05-09 MED ORDER — PROCHLORPERAZINE MALEATE 10 MG PO TABS
ORAL_TABLET | ORAL | Status: AC
  Filled 2019-05-09: qty 1

## 2019-05-09 MED ORDER — SODIUM CHLORIDE 0.9 % IV SOLN
Freq: Once | INTRAVENOUS | Status: AC
  Filled 2019-05-09: qty 250

## 2019-05-09 MED ORDER — PACLITAXEL PROTEIN-BOUND CHEMO INJECTION 100 MG
100.0000 mg/m2 | Freq: Once | INTRAVENOUS | Status: AC
  Administered 2019-05-09: 150 mg via INTRAVENOUS
  Filled 2019-05-09: qty 30

## 2019-05-09 MED ORDER — HEPARIN SOD (PORK) LOCK FLUSH 100 UNIT/ML IV SOLN
500.0000 [IU] | Freq: Once | INTRAVENOUS | Status: AC | PRN
  Administered 2019-05-09: 500 [IU]
  Filled 2019-05-09: qty 5

## 2019-05-09 NOTE — Progress Notes (Signed)
Nutrition follow up completed with patient during infusion for pancreas cancer. Weight improved and documented at 101 pounds, increased from 93.7 pounds. Patient denies diarrhea so feels he does need Creon. He has discussed with his MD who agrees. He is drinking 2-3 Ensure Enlive daily. He does not report nutrition impact symptoms.  Nutrition Diagnosis: Underweight continues but has improved.  Intervention: Continue Ensure Enlive 2-3 daily. Patient to contact me when he needs samples of oral nutrition supplements.  Monitoring, Evaluation, Goals: Patient will tolerate adequate calories and protein to minimize weight loss.  Next Visit: March 3, during infusion.  **Disclaimer: This note was dictated with voice recognition software. Similar sounding words can inadvertently be transcribed and this note may contain transcription errors which may not have been corrected upon publication of note.**

## 2019-05-09 NOTE — Patient Instructions (Signed)
[facility] Discharge Instructions for Patients Receiving Chemotherapy  Today you received the following chemotherapy agents: Abraxane and Gemzar   To help prevent nausea and vomiting after your treatment, we encourage you to take your nausea medication as directed.    If you develop nausea and vomiting that is not controlled by your nausea medication, call the clinic.   BELOW ARE SYMPTOMS THAT SHOULD BE REPORTED IMMEDIATELY:  *FEVER GREATER THAN 100.5 F  *CHILLS WITH OR WITHOUT FEVER  NAUSEA AND VOMITING THAT IS NOT CONTROLLED WITH YOUR NAUSEA MEDICATION  *UNUSUAL SHORTNESS OF BREATH  *UNUSUAL BRUISING OR BLEEDING  TENDERNESS IN MOUTH AND THROAT WITH OR WITHOUT PRESENCE OF ULCERS  *URINARY PROBLEMS  *BOWEL PROBLEMS  UNUSUAL RASH Items with * indicate a potential emergency and should be followed up as soon as possible.  Feel free to call the clinic should you have any questions or concerns. The clinic phone number is [phone].  Please show the CHEMO ALERT CARD at check-in to the Emergency Department and triage nurse.   

## 2019-05-09 NOTE — Progress Notes (Signed)
[place] OFFICE PROGRESS NOTE  Patient Care Team: Kerin Perna, NP as PCP - General (Internal Medicine)  HEME/ONC OVERVIEW: 1. 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, unresectable  -12/2018: 1.5cm mass in the pancreatic body surrounding the SMA and celiac axis   EUS showed a 2.4 x 1.9cm pancreatic body mass, involving the celiaci trunk, no peri-pancreatic adenopathy  Bx showed adenocarcinoma; molecular studies pending  -01/2019: PET showed FDG-avid pancreatic mass involving the celiac trunk, peri-aortic adenopathy; no definite metastatic disease  -Late 01/2019 - present: palliative gemcitabine/Abraxane   TREATMENT REGIMEN:  01/24/2019 - present: palliative gemcitabine/Abraxane, modified  ASSESSMENT & PLAN:   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, unresectable  -S/p 2 cycles of palliative gemcitabine/Abraxane -Labs reviewed and adequate, proceed with C3D15 of gemcitabine/Abraxane  -As the patient has tolerated the current regimen relatively well, I will increase the gemcitabine dose from 800 to 1051m/m2, starting with C3D15 -If he tolerates the increased dose of gemcitabine, then we can increase Abraxane dose in the future as well (to the standard treatment dose)  -I will see him every other treatment to monitor treatment-related toxicities -We will plan to repeat scans after cycle 4 of chemotherapy, and if disease remains stable, we can consider SBRT vs surgery to the primary malignancy -Due to patient declining genetic testing, I have ordered molecular testing, including MSI, on the pancreatic bx  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 -Hgb 13.1, stable  -Patient denies any symptom of bleeding -We will monitor for now; no indication for dose adjustment  Abdominal pain -Secondary to vascular and nerve involvement by pancreatic  cancer  -Currently on MS-Contin 120mBID and IR morphine 1566m6hrs PRN for pain control  -Pain relatively well controlled -Continue the current regimen; I have refilled IR morphine today   Protein malnutrition -Currently drinking 2-3 Ensure per day and able to eat and drink without limitation -Weight relatively stable -I encouraged the patient to maintain adequate nutrition, and continue follow-up with nutrition  Family history of malignancy -Patient declined genetic evaluation despite extensive family hx of malignancy and several discussions  -We will continue to assess his willingness for genetic testing periodically  No orders of the defined types were placed in this encounter.  All questions were answered. The patient knows to call the clinic with any problems, questions or concerns. No barriers to learning was detected.  Return in 4 weeks for C4D15 of chemotherapy and clinic appt.   YanTish MenD 2/3/20212:23 PM  CHIEF COMPLAINT: "I am doing okay"  INTERVAL HISTORY: [facility] clinic for follow-up of locally advanced adenocarcinoma of the pancreatic body on palliative chemotherapy.  Patient reports that he has tolerated treatment relatively well so far, and denies any significant side effects, such as nausea, vomiting, diarrhea, or neuropathy.  He takes MS-Contin and PRN IR morphine with adequate control of abdominal pain.  He has not had any recurrent diarrhea.  He has not yet received Creon despite completing a second application.  He has periodic chills, but denies any fever or other symptoms of infection.  He denies any other complaint.  REVIEW OF SYSTEMS:   Constitutional: ( - ) fevers, ( + )  chills , ( - ) night sweats Eyes: ( - ) blurriness of vision, ( - ) double vision, ( - ) watery eyes Ears, nose, mouth, throat, and face: ( - ) mucositis, ( - ) sore throat Respiratory: ( - )  cough, ( - ) dyspnea, ( - ) wheezes Cardiovascular: ( - ) palpitation, ( - ) chest  discomfort, ( - ) lower extremity swelling Gastrointestinal:  ( - ) nausea, ( - ) heartburn, ( - ) change in bowel habits Skin: ( - ) abnormal skin rashes Lymphatics: ( - ) new lymphadenopathy, ( - ) easy bruising Neurological: ( - ) numbness, ( - ) tingling, ( - ) new weaknesses Behavioral/Psych: ( - ) mood change, ( - ) new changes  All other systems were reviewed with the patient and are negative.  SUMMARY OF ONCOLOGIC HISTORY: Oncology History  Pancreatic adenocarcinoma [facility])   Initial Diagnosis   Pancreatic adenocarcinoma ([place])   12/15/2018 Imaging   CT abdomen/pelvis w/ contrast: IMPRESSION: 1. Suggestion of a 1.5 cm mass within the mid aspect of the pancreatic body with associated atrophy of the upstream pancreatic tail and associated pancreatic ductal dilatation. Recommend further evaluation with MRI. There is soft tissue fullness around the superior mesenteric artery and celiac axis. This may represent edema or potentially tumor involvement. 2. Tree-in-bud nodularity within the peripheral aspect of the lower lobes bilaterally which may be secondary to an infectious or inflammatory process. Recommend follow-up chest CT in 3 months to assess for interval change. 3. These results will be called to the ordering clinician or representative by the Radiologist Assistant, and communication documented in the PACS or zVision Dashboard.   12/29/2018 Procedure   EUS: -2.4 x 1.9cm mass in the body of the pancreas causing the main pancreatic duct obstruction and clearly involving the celiac trunk. Biopsied. -Heterogenous soft tissue mass in the neck, measuring 3.6cm maximally. This was not sampled, presumed to be a large thyroid gland.  -No peripancreatic adenopathy -CBD was normal, non-dilated.   12/29/2018 Pathology Results   CASE: [id number]   DIAGNOSIS:  - Malignant cells consistent with adenocarcinoma  - See comment.   01/17/2019 Imaging   PET: IMPRESSION: 1. Suspected  enlargement of the pancreatic mass, with abnormal hypermetabolic activity at the junction of the pancreatic body and tail currently measuring 3.4 by 2.9 by 2.4 cm. Although anatomic localization is hampered by the patient's extreme paucity of adipose tissue (making separation of adjacent structures difficult on noncontrast CT), there is thought to be a high likelihood tumor extending around the vicinity of the celiac trunk and possibly the superior mesenteric artery. In this case, MRI might provide better anatomic localization with respect to perivascular involvement. 2. Potential mild left periaortic adenopathy, maximum SUV of the indistinct left periaortic lymph nodes is approximately 3.3 which is above blood pool levels. 3. Scattered ground-glass density nodules in the lungs favoring the upper lobes, with tree-in-bud nodularity in the lung bases favoring the lower lobes. None of these lesions are hypermetabolic. While possibilities include atypical infectious process drug reaction, surveillance of the chest is recommended to exclude low-grade adenocarcinoma. 4.  Aortic Atherosclerosis (ICD10-I70.0).   01/24/2019 -  Chemotherapy   The patient had PACLitaxel-protein bound (ABRAXANE) chemo infusion 150 mg, 100 mg/m2 = 150 mg (100 % of original dose 100 mg/m2), Intravenous,  Once, 3 of 6 cycles Dose modification: 100 mg/m2 (original dose 100 mg/m2, Cycle 1, Reason: Provider Judgment) Administration: 150 mg (01/24/2019), 150 mg (02/14/2019), 150 mg (03/02/2019), 150 mg (03/16/2019), 150 mg (04/26/2019) gemcitabine (GEMZAR) 1,102 mg in sodium chloride 0.9 % 250 mL chemo infusion, 800 mg/m2 = 1,102 mg (100 % of original dose 800 mg/m2), Intravenous,  Once, 3 of 6 cycles Dose modification: 800 mg/m2 (original dose  800 mg/m2, Cycle 1, Reason: Provider Judgment), 1,000 mg/m2 (original dose 800 mg/m2, Cycle 5, Reason: Provider Judgment) Administration: 1,102 mg (01/24/2019), 1,102 mg (02/14/2019),  1,102 mg (03/02/2019), 1,102 mg (03/16/2019), 1,102 mg (04/26/2019)  for chemotherapy treatment.    02/28/2019 Cancer Staging   Staging form: Exocrine Pancreas, AJCC 8th Edition - Clinical: Stage IB (cT2, cN0, cM0) - Signed by Tish Men, MD on 02/28/2019   04/16/2019 Imaging   CT CAP: IMPRESSION: 1. Margins of pancreatic neoplasm are difficult to identify. Similar appearance of soft tissue infiltration within the retroperitoneum with progressive narrowing of the portal venous confluence by tumor. No specific findings of solid organ metastasis or nodal metastasis within the abdomen or pelvis. 2. Unchanged appearance of multifocal sub solid nodules within the upper lung zones and bilateral lower lung zone tree-in-bud nodularity. As mentioned previously findings are nonspecific and may be inflammatory or infectious in etiology. Metastatic disease is not excluded. 3. Aortic atherosclerosis. 4. Right lobe of thyroid gland nodule. Consider further evaluation with thyroid ultrasound. If patient is clinically hyperthyroid, consider nuclear medicine thyroid uptake and scan.     I have reviewed the past medical history, past surgical history, social history and family history with the patient and they are unchanged from previous note.  ALLERGIES:  is allergic to penicillins.  MEDICATIONS:  Current Outpatient Medications  Medication Sig Dispense Refill  . cyclobenzaprine (FLEXERIL) 10 MG tablet Take 10 mg by mouth 3 (three) times daily as needed for muscle spasms.    . diphenhydramine-acetaminophen (TYLENOL PM) 25-500 MG TABS tablet Take 2 tablets by mouth at bedtime as needed (sleep).    Marland Kitchen lidocaine-prilocaine (EMLA) cream Apply to affected area once 30 g 3  . LORazepam (ATIVAN) 0.5 MG tablet Take 1 tablet (0.5 mg total) by mouth every 6 (six) hours as needed (Nausea or vomiting). 30 tablet 0  . morphine (MS CONTIN) 15 MG 12 hr tablet Take 1 tablet (15 mg total) by mouth every 12 (twelve)  hours. 60 tablet 0  . morphine (MSIR) 15 MG tablet Take 1 tablet (15 mg total) by mouth every 6 (six) hours as needed for severe pain. 60 tablet 0  . ondansetron (ZOFRAN) 8 MG tablet Take 1 tablet (8 mg total) by mouth 2 (two) times daily as needed (Nausea or vomiting). 30 tablet 1  . polyethylene glycol (MIRALAX / GLYCOLAX) 17 g packet Take 17 g by mouth daily.    . prochlorperazine (COMPAZINE) 10 MG tablet Take 1 tablet (10 mg total) by mouth every 6 (six) hours as needed (Nausea or vomiting). (Patient not taking: Reported on 05/09/2019) 30 tablet 1  . senna-docusate (SENNA S) 8.6-50 MG tablet Take 2 tablets by mouth at bedtime.     No current facility-administered medications for this visit.   Facility-Administered Medications Ordered in Other Visits  Medication Dose Route Frequency Provider Last Rate Last Admin  . oxyCODONE-acetaminophen (PERCOCET/ROXICET) 5-325 MG per tablet 2 tablet  2 tablet Oral Once Tish Men, MD      . potassium chloride SA (KLOR-CON) CR tablet 40 mEq  40 mEq Oral Once Tish Men, MD        PHYSICAL EXAMINATION: ECOG PERFORMANCE STATUS: 2 - Symptomatic, <50% confined to bed  Today's Vitals   05/09/19 1401 05/09/19 1404  BP: 111/84   Pulse: (!) 102   Resp: 18   Temp: 99.1 F (37.3 C)   TempSrc: Temporal   SpO2: 100%   Weight: 95 lb (43.1 kg)   Height: 5'  8" (1.727 m)   PainSc:  0-No pain   Body mass index is 14.44 kg/m.  Filed Weights   05/09/19 1401  Weight: 95 lb (43.1 kg)    GENERAL: alert, no distress and comfortable, very thin SKIN: skin color, texture, turgor are normal, no rashes or significant lesions EYES: conjunctiva are pink and non-injected, sclera clear OROPHARYNX: no exudate, no erythema; lips, buccal mucosa, and tongue normal  NECK: supple, non-tender LYMPH:  no palpable lymphadenopathy in the cervical LUNGS: clear to auscultation with normal breathing effort HEART: regular rate & rhythm and no murmurs and no lower extremity  edema ABDOMEN: soft, non-tender, non-distended, normal bowel sounds Musculoskeletal: no cyanosis of digits and no clubbing  PSYCH: alert & oriented x 3, fluent speech  LABORATORY DATA:  I have reviewed the data as listed    Component Value Date/Time   NA 140 04/26/2019 0826   K 3.8 04/26/2019 0826   CL 104 04/26/2019 0826   CO2 28 04/26/2019 0826   GLUCOSE 86 04/26/2019 0826   BUN 7 04/26/2019 0826   CREATININE 0.72 04/26/2019 0826   CALCIUM 8.6 (L) 04/26/2019 0826   PROT 6.7 04/26/2019 0826   ALBUMIN 4.0 04/26/2019 0826   AST 19 04/26/2019 0826   ALT 49 (H) 04/26/2019 0826   ALKPHOS 68 04/26/2019 0826   BILITOT 0.3 04/26/2019 0826   GFRNONAA >60 04/26/2019 0826   GFRAA >60 04/26/2019 0826    No results found for: SPEP, UPEP  Lab Results  Component Value Date   WBC 4.2 05/09/2019   NEUTROABS 2.5 05/09/2019   HGB 13.1 05/09/2019   HCT 39.7 05/09/2019   MCV 93.0 05/09/2019   PLT 288 05/09/2019      Chemistry      Component Value Date/Time   NA 140 04/26/2019 0826   K 3.8 04/26/2019 0826   CL 104 04/26/2019 0826   CO2 28 04/26/2019 0826   BUN 7 04/26/2019 0826   CREATININE 0.72 04/26/2019 0826      Component Value Date/Time   CALCIUM 8.6 (L) 04/26/2019 0826   ALKPHOS 68 04/26/2019 0826   AST 19 04/26/2019 0826   ALT 49 (H) 04/26/2019 0826   BILITOT 0.3 04/26/2019 0826       RADIOGRAPHIC STUDIES: I have personally reviewed the radiological images as listed below and agreed with the findings in the report. CT chest w/ contrast  Result Date: 04/16/2019 CLINICAL DATA:  Pancreatic adenocarcinoma. EXAM: CT CHEST, ABDOMEN, AND PELVIS WITH CONTRAST TECHNIQUE: Multidetector CT imaging of the chest, abdomen and pelvis was performed following the standard protocol during bolus administration of intravenous contrast. CONTRAST:  121m OMNIPAQUE IOHEXOL 300 MG/ML  SOLN COMPARISON:  PET-CT 01/17/2019 FINDINGS: CT CHEST FINDINGS Cardiovascular: Normal heart size. No  pericardial effusion identified. Mediastinum/Nodes: 2.9 cm heterogeneous nodule within right lobe of thyroid gland is identified, image 22/7. The trachea appears patent and is midline. Normal appearance of the esophagus. No adenopathy Lungs/Pleura: No pleural effusion multiple pulmonary nodules are scattered throughout both lungs with a lower lung zone predominance. -index sub solid nodule within the anterolateral left upper lobe measures 1.1 cm, image 56/12. Unchanged. Index subpleural nodule within the perifissural left lower lobe measures 1.2 cm, image 93/12. Unchanged from previous exam. Bilateral peripheral and lower lung zone predominant tree-in-bud nodules are similar to previous exam, image 144/12 and image 147/12. Musculoskeletal: No chest wall mass or suspicious bone lesions identified. CT ABDOMEN PELVIS FINDINGS Hepatobiliary: Stable 7 mm low-density segment 7 liver lesion. Focal  area of low attenuation about the falciform ligament is identified, image 115/7. Favor focal fatty deposition. Gallbladder negative. No significant biliary ductal dilatation. Pancreas: There is atrophy of the tail of pancreas with dilatation of the mid and distal main pancreatic duct. The margins of the pancreas tumor is very difficult to identified. Infiltrative soft tissue into the retroperitoneum around the celiac trunk and SMA is again noted. Narrowing of the portal venous confluence by tumor is noted which appears progressive from 12/15/2018. Spleen: Normal in size without focal abnormality. Adrenals/Urinary Tract: Normal appearance of the adrenal glands. The kidneys are unremarkable. No mass or hydronephrosis. Urinary bladder appears within normal limits. Stomach/Bowel: Stomach is within normal limits. No evidence of bowel wall thickening, distention, or inflammatory changes. Vascular/Lymphatic: Aortic atherosclerosis. No adenopathy. Reproductive: Prostate is unremarkable. Other: No abdominal wall hernia or abnormality. No  abdominopelvic ascites. Musculoskeletal: No acute or significant osseous findings. IMPRESSION: 1. Margins of pancreatic neoplasm are difficult to identify. Similar appearance of soft tissue infiltration within the retroperitoneum with progressive narrowing of the portal venous confluence by tumor. No specific findings of solid organ metastasis or nodal metastasis within the abdomen or pelvis. 2. Unchanged appearance of multifocal sub solid nodules within the upper lung zones and bilateral lower lung zone tree-in-bud nodularity. As mentioned previously findings are nonspecific and may be inflammatory or infectious in etiology. Metastatic disease is not excluded. 3. Aortic atherosclerosis. 4. Right lobe of thyroid gland nodule. Consider further evaluation with thyroid ultrasound. If patient is clinically hyperthyroid, consider nuclear medicine thyroid uptake and scan. Aortic Atherosclerosis (ICD10-I70.0). Electronically Signed   By: Kerby Moors M.D.   On: 04/16/2019 15:28   CT AP w/ contrast  Result Date: 04/16/2019 CLINICAL DATA:  Pancreatic adenocarcinoma. EXAM: CT CHEST, ABDOMEN, AND PELVIS WITH CONTRAST TECHNIQUE: Multidetector CT imaging of the chest, abdomen and pelvis was performed following the standard protocol during bolus administration of intravenous contrast. CONTRAST:  16m OMNIPAQUE IOHEXOL 300 MG/ML  SOLN COMPARISON:  PET-CT 01/17/2019 FINDINGS: CT CHEST FINDINGS Cardiovascular: Normal heart size. No pericardial effusion identified. Mediastinum/Nodes: 2.9 cm heterogeneous nodule within right lobe of thyroid gland is identified, image 22/7. The trachea appears patent and is midline. Normal appearance of the esophagus. No adenopathy Lungs/Pleura: No pleural effusion multiple pulmonary nodules are scattered throughout both lungs with a lower lung zone predominance. -index sub solid nodule within the anterolateral left upper lobe measures 1.1 cm, image 56/12. Unchanged. Index subpleural nodule  within the perifissural left lower lobe measures 1.2 cm, image 93/12. Unchanged from previous exam. Bilateral peripheral and lower lung zone predominant tree-in-bud nodules are similar to previous exam, image 144/12 and image 147/12. Musculoskeletal: No chest wall mass or suspicious bone lesions identified. CT ABDOMEN PELVIS FINDINGS Hepatobiliary: Stable 7 mm low-density segment 7 liver lesion. Focal area of low attenuation about the falciform ligament is identified, image 115/7. Favor focal fatty deposition. Gallbladder negative. No significant biliary ductal dilatation. Pancreas: There is atrophy of the tail of pancreas with dilatation of the mid and distal main pancreatic duct. The margins of the pancreas tumor is very difficult to identified. Infiltrative soft tissue into the retroperitoneum around the celiac trunk and SMA is again noted. Narrowing of the portal venous confluence by tumor is noted which appears progressive from 12/15/2018. Spleen: Normal in size without focal abnormality. Adrenals/Urinary Tract: Normal appearance of the adrenal glands. The kidneys are unremarkable. No mass or hydronephrosis. Urinary bladder appears within normal limits. Stomach/Bowel: Stomach is within normal limits. No evidence of bowel  wall thickening, distention, or inflammatory changes. Vascular/Lymphatic: Aortic atherosclerosis. No adenopathy. Reproductive: Prostate is unremarkable. Other: No abdominal wall hernia or abnormality. No abdominopelvic ascites. Musculoskeletal: No acute or significant osseous findings. IMPRESSION: 1. Margins of pancreatic neoplasm are difficult to identify. Similar appearance of soft tissue infiltration within the retroperitoneum with progressive narrowing of the portal venous confluence by tumor. No specific findings of solid organ metastasis or nodal metastasis within the abdomen or pelvis. 2. Unchanged appearance of multifocal sub solid nodules within the upper lung zones and bilateral lower  lung zone tree-in-bud nodularity. As mentioned previously findings are nonspecific and may be inflammatory or infectious in etiology. Metastatic disease is not excluded. 3. Aortic atherosclerosis. 4. Right lobe of thyroid gland nodule. Consider further evaluation with thyroid ultrasound. If patient is clinically hyperthyroid, consider nuclear medicine thyroid uptake and scan. Aortic Atherosclerosis (ICD10-I70.0). Electronically Signed   By: Kerby Moors M.D.   On: 04/16/2019 15:28

## 2019-05-09 NOTE — Patient Instructions (Signed)

## 2019-05-10 ENCOUNTER — Encounter: Payer: Self-pay | Admitting: *Deleted

## 2019-05-17 ENCOUNTER — Other Ambulatory Visit: Payer: Self-pay | Admitting: Hematology

## 2019-05-17 DIAGNOSIS — C259 Malignant neoplasm of pancreas, unspecified: Secondary | ICD-10-CM

## 2019-05-17 MED ORDER — MORPHINE SULFATE ER 15 MG PO TBCR: 15.0000 mg | EXTENDED_RELEASE_TABLET | Freq: Two times a day (BID) | ORAL | 0 refills | Status: AC

## 2019-05-23 ENCOUNTER — Inpatient Hospital Stay: Payer: Medicaid Other

## 2019-05-23 ENCOUNTER — Other Ambulatory Visit: Payer: Self-pay

## 2019-05-23 VITALS — BP 117/86 | HR 59 | Temp 98.0°F | Resp 16 | Ht 68.0 in | Wt 95.8 lb

## 2019-05-23 DIAGNOSIS — Z5111 Encounter for antineoplastic chemotherapy: Secondary | ICD-10-CM | POA: Diagnosis not present

## 2019-05-23 DIAGNOSIS — C259 Malignant neoplasm of pancreas, unspecified: Secondary | ICD-10-CM

## 2019-05-23 LAB — CBC WITH DIFFERENTIAL (CANCER CENTER ONLY)
Abs Immature Granulocytes: 0.01 10*3/uL (ref 0.00–0.07)
Basophils Absolute: 0 10*3/uL (ref 0.0–0.1)
Basophils Relative: 1 %
Eosinophils Absolute: 0.2 10*3/uL (ref 0.0–0.5)
Eosinophils Relative: 3 %
HCT: 36.2 % — ABNORMAL LOW (ref 39.0–52.0)
Hemoglobin: 12 g/dL — ABNORMAL LOW (ref 13.0–17.0)
Immature Granulocytes: 0 %
Lymphocytes Relative: 22 %
Lymphs Abs: 1.2 10*3/uL (ref 0.7–4.0)
MCH: 30.5 pg (ref 26.0–34.0)
MCHC: 33.1 g/dL (ref 30.0–36.0)
MCV: 92.1 fL (ref 80.0–100.0)
Monocytes Absolute: 0.6 10*3/uL (ref 0.1–1.0)
Monocytes Relative: 10 %
Neutro Abs: 3.6 10*3/uL (ref 1.7–7.7)
Neutrophils Relative %: 64 %
Platelet Count: 187 10*3/uL (ref 150–400)
RBC: 3.93 MIL/uL — ABNORMAL LOW (ref 4.22–5.81)
RDW: 12.9 % (ref 11.5–15.5)
WBC Count: 5.6 10*3/uL (ref 4.0–10.5)
nRBC: 0 % (ref 0.0–0.2)

## 2019-05-23 LAB — CMP (CANCER CENTER ONLY)
ALT: 36 U/L (ref 0–44)
AST: 13 U/L — ABNORMAL LOW (ref 15–41)
Albumin: 3.7 g/dL (ref 3.5–5.0)
Alkaline Phosphatase: 63 U/L (ref 38–126)
Anion gap: 7 (ref 5–15)
BUN: 7 mg/dL (ref 6–20)
CO2: 28 mmol/L (ref 22–32)
Calcium: 8.6 mg/dL — ABNORMAL LOW (ref 8.9–10.3)
Chloride: 106 mmol/L (ref 98–111)
Creatinine: 0.68 mg/dL (ref 0.61–1.24)
GFR, Est AFR Am: >60 mL/min (ref >60)
GFR, Estimated: >60 mL/min (ref >60)
Glucose, Bld: 91 mg/dL (ref 70–99)
Potassium: 3.9 mmol/L (ref 3.5–5.1)
Sodium: 141 mmol/L (ref 135–145)
Total Bilirubin: 0.3 mg/dL (ref 0.3–1.2)
Total Protein: 6.6 g/dL (ref 6.5–8.1)

## 2019-05-23 MED ORDER — HEPARIN SOD (PORK) LOCK FLUSH 100 UNIT/ML IV SOLN
500.0000 [IU] | Freq: Once | INTRAVENOUS | Status: AC | PRN
  Administered 2019-05-23: 12:00:00 500 [IU]
  Filled 2019-05-23: qty 5

## 2019-05-23 MED ORDER — SODIUM CHLORIDE 0.9% FLUSH
10.0000 mL | INTRAVENOUS | Status: DC | PRN
  Administered 2019-05-23: 12:00:00 10 mL
  Filled 2019-05-23: qty 10

## 2019-05-23 MED ORDER — SODIUM CHLORIDE 0.9 % IV SOLN
1000.0000 mg/m2 | Freq: Once | INTRAVENOUS | Status: AC
  Administered 2019-05-23: 1406 mg via INTRAVENOUS
  Filled 2019-05-23: qty 36.98

## 2019-05-23 MED ORDER — SODIUM CHLORIDE 0.9 % IV SOLN
Freq: Once | INTRAVENOUS | Status: AC
  Filled 2019-05-23: qty 250

## 2019-05-23 MED ORDER — PROCHLORPERAZINE MALEATE 10 MG PO TABS
ORAL_TABLET | ORAL | Status: AC
  Filled 2019-05-23: qty 1

## 2019-05-23 MED ORDER — PACLITAXEL PROTEIN-BOUND CHEMO INJECTION 100 MG
100.0000 mg/m2 | Freq: Once | INTRAVENOUS | Status: AC
  Administered 2019-05-23: 11:00:00 150 mg via INTRAVENOUS
  Filled 2019-05-23: qty 30

## 2019-05-23 MED ORDER — PROCHLORPERAZINE MALEATE 10 MG PO TABS
10.0000 mg | ORAL_TABLET | Freq: Once | ORAL | Status: AC
  Administered 2019-05-23: 10 mg via ORAL

## 2019-05-23 NOTE — Patient Instructions (Signed)
[facility] Discharge Instructions for Patients Receiving Chemotherapy  Today you received the following chemotherapy agents:  Abraxane, Gemcitibine  To help prevent nausea and vomiting after your treatment, we encourage you to take your nausea medication as prescribed.   If you develop nausea and vomiting that is not controlled by your nausea medication, call the clinic.   BELOW ARE SYMPTOMS THAT SHOULD BE REPORTED IMMEDIATELY:  *FEVER GREATER THAN 100.5 F  *CHILLS WITH OR WITHOUT FEVER  NAUSEA AND VOMITING THAT IS NOT CONTROLLED WITH YOUR NAUSEA MEDICATION  *UNUSUAL SHORTNESS OF BREATH  *UNUSUAL BRUISING OR BLEEDING  TENDERNESS IN MOUTH AND THROAT WITH OR WITHOUT PRESENCE OF ULCERS  *URINARY PROBLEMS  *BOWEL PROBLEMS  UNUSUAL RASH Items with * indicate a potential emergency and should be followed up as soon as possible.  Feel free to call the clinic should you have any questions or concerns. The clinic phone number is [phone].  Please show the [facility] at check-in to the Emergency Department and triage nurse.

## 2019-06-01 ENCOUNTER — Other Ambulatory Visit: Payer: Self-pay | Admitting: Hematology

## 2019-06-01 DIAGNOSIS — C259 Malignant neoplasm of pancreas, unspecified: Secondary | ICD-10-CM

## 2019-06-01 MED ORDER — MORPHINE SULFATE 15 MG PO TABS: 15.0000 mg | ORAL_TABLET | Freq: Four times a day (QID) | ORAL | 0 refills | Status: DC | PRN

## 2019-06-06 ENCOUNTER — Telehealth: Payer: Self-pay | Admitting: *Deleted

## 2019-06-06 ENCOUNTER — Other Ambulatory Visit: Payer: Self-pay

## 2019-06-06 ENCOUNTER — Encounter: Payer: Self-pay | Admitting: Nutrition

## 2019-06-06 ENCOUNTER — Ambulatory Visit: Payer: Self-pay

## 2019-06-06 ENCOUNTER — Ambulatory Visit: Payer: Self-pay | Admitting: Hematology

## 2019-06-06 NOTE — Telephone Encounter (Signed)
TCT patient and his significant other, Gabriel [name] as patient did not show for his appt today. Pt did not answer and his mailbox is full. Call to Gabriel [name]-she did not answer but I was able to leave a vm message for her regarding today's appt.

## 2019-06-19 ENCOUNTER — Other Ambulatory Visit: Payer: Self-pay | Admitting: Hematology

## 2019-06-19 ENCOUNTER — Other Ambulatory Visit: Payer: Self-pay

## 2019-06-19 DIAGNOSIS — C259 Malignant neoplasm of pancreas, unspecified: Secondary | ICD-10-CM

## 2019-06-19 MED ORDER — MORPHINE SULFATE 15 MG PO TABS: 15.0000 mg | ORAL_TABLET | Freq: Four times a day (QID) | ORAL | 0 refills | Status: DC | PRN

## 2019-06-19 NOTE — Progress Notes (Signed)
Medication has been refilled.  Thanks.  Dr. Maylon Peppers

## 2019-06-20 ENCOUNTER — Telehealth: Payer: Self-pay | Admitting: Hematology

## 2019-06-20 ENCOUNTER — Inpatient Hospital Stay: Payer: Medicaid Other

## 2019-06-20 ENCOUNTER — Other Ambulatory Visit: Payer: Self-pay | Admitting: Hematology

## 2019-06-20 ENCOUNTER — Telehealth: Payer: Self-pay

## 2019-06-20 ENCOUNTER — Other Ambulatory Visit: Payer: Self-pay

## 2019-06-20 DIAGNOSIS — C259 Malignant neoplasm of pancreas, unspecified: Secondary | ICD-10-CM

## 2019-06-20 DIAGNOSIS — G893 Neoplasm related pain (acute) (chronic): Secondary | ICD-10-CM

## 2019-06-20 MED ORDER — MORPHINE SULFATE ER 15 MG PO TBCR: 15.0000 mg | EXTENDED_RELEASE_TABLET | Freq: Two times a day (BID) | ORAL | 0 refills | Status: DC

## 2019-06-20 NOTE — Telephone Encounter (Signed)
Received call from patient requesting refill on his MORPHINE SULFATE ER 15 MG PO Q12HRS.  Refill request routed to MD.

## 2019-06-20 NOTE — Telephone Encounter (Signed)
TCT patient regarding pain medication refill sent to his pharmacy for pick up.   Advised him per Dr. Maylon Peppers to reschedule his treatment appt., emphazed that he has not had any treatment since 05/23/2019 and treatment would have a chance to work only if he adheres to the treatment schedule. Also informed him that Dr. Maylon Peppers would not be refilling his pain medications if he misses anymore treatment appts. Pt. Verbalized understanding. Provided phone number to scheduling.

## 2019-06-20 NOTE — Telephone Encounter (Signed)
Pt. was not seen at his Lab appt for 0800 today. 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   Appts canceled and scheduling message sent to call pt. to reschedule.

## 2019-06-20 NOTE — Telephone Encounter (Signed)
Thank you for letting me know.   Please emphasize with the patient that he has not had any treatment since 05/23/2019, and treatment would have a chance to work only if he adheres to the treatment schedule.  Dr. Maylon Peppers

## 2019-06-20 NOTE — Telephone Encounter (Signed)
R/s appt per 3/17 sch message - pt is aware of appt

## 2019-06-21 ENCOUNTER — Other Ambulatory Visit: Payer: Self-pay | Admitting: Hematology

## 2019-06-22 ENCOUNTER — Inpatient Hospital Stay: Payer: Medicaid Other

## 2019-06-22 ENCOUNTER — Other Ambulatory Visit: Payer: Self-pay

## 2019-06-22 ENCOUNTER — Inpatient Hospital Stay: Payer: Medicaid Other | Attending: Hematology

## 2019-06-22 VITALS — BP 107/80 | HR 73 | Temp 99.2°F | Resp 16

## 2019-06-22 DIAGNOSIS — T451X5A Adverse effect of antineoplastic and immunosuppressive drugs, initial encounter: Secondary | ICD-10-CM | POA: Diagnosis not present

## 2019-06-22 DIAGNOSIS — Z79899 Other long term (current) drug therapy: Secondary | ICD-10-CM | POA: Diagnosis not present

## 2019-06-22 DIAGNOSIS — E46 Unspecified protein-calorie malnutrition: Secondary | ICD-10-CM | POA: Diagnosis not present

## 2019-06-22 DIAGNOSIS — R109 Unspecified abdominal pain: Secondary | ICD-10-CM | POA: Diagnosis not present

## 2019-06-22 DIAGNOSIS — C251 Malignant neoplasm of body of pancreas: Secondary | ICD-10-CM | POA: Insufficient documentation

## 2019-06-22 DIAGNOSIS — D6481 Anemia due to antineoplastic chemotherapy: Secondary | ICD-10-CM | POA: Insufficient documentation

## 2019-06-22 DIAGNOSIS — C259 Malignant neoplasm of pancreas, unspecified: Secondary | ICD-10-CM

## 2019-06-22 DIAGNOSIS — Z5111 Encounter for antineoplastic chemotherapy: Secondary | ICD-10-CM | POA: Diagnosis present

## 2019-06-22 LAB — CBC WITH DIFFERENTIAL (CANCER CENTER ONLY)
Abs Immature Granulocytes: 0.01 10*3/uL (ref 0.00–0.07)
Basophils Absolute: 0 10*3/uL (ref 0.0–0.1)
Basophils Relative: 1 %
Eosinophils Absolute: 0.1 10*3/uL (ref 0.0–0.5)
Eosinophils Relative: 3 %
HCT: 39.3 % (ref 39.0–52.0)
Hemoglobin: 12.8 g/dL — ABNORMAL LOW (ref 13.0–17.0)
Immature Granulocytes: 0 %
Lymphocytes Relative: 20 %
Lymphs Abs: 1 10*3/uL (ref 0.7–4.0)
MCH: 30.5 pg (ref 26.0–34.0)
MCHC: 32.6 g/dL (ref 30.0–36.0)
MCV: 93.6 fL (ref 80.0–100.0)
Monocytes Absolute: 0.3 10*3/uL (ref 0.1–1.0)
Monocytes Relative: 6 %
Neutro Abs: 3.6 10*3/uL (ref 1.7–7.7)
Neutrophils Relative %: 70 %
Platelet Count: 241 10*3/uL (ref 150–400)
RBC: 4.2 MIL/uL — ABNORMAL LOW (ref 4.22–5.81)
RDW: 13.4 % (ref 11.5–15.5)
WBC Count: 5.1 10*3/uL (ref 4.0–10.5)
nRBC: 0 % (ref 0.0–0.2)

## 2019-06-22 LAB — CMP (CANCER CENTER ONLY)
ALT: 29 U/L (ref 0–44)
AST: 14 U/L — ABNORMAL LOW (ref 15–41)
Albumin: 4 g/dL (ref 3.5–5.0)
Alkaline Phosphatase: 61 U/L (ref 38–126)
Anion gap: 7 (ref 5–15)
BUN: 6 mg/dL (ref 6–20)
CO2: 26 mmol/L (ref 22–32)
Calcium: 8.7 mg/dL — ABNORMAL LOW (ref 8.9–10.3)
Chloride: 105 mmol/L (ref 98–111)
Creatinine: 0.67 mg/dL (ref 0.61–1.24)
GFR, Est AFR Am: >60 mL/min (ref >60)
GFR, Estimated: >60 mL/min (ref >60)
Glucose, Bld: 90 mg/dL (ref 70–99)
Potassium: 3.9 mmol/L (ref 3.5–5.1)
Sodium: 138 mmol/L (ref 135–145)
Total Bilirubin: 0.4 mg/dL (ref 0.3–1.2)
Total Protein: 6.8 g/dL (ref 6.5–8.1)

## 2019-06-22 MED ORDER — SODIUM CHLORIDE 0.9 % IV SOLN
1000.0000 mg/m2 | Freq: Once | INTRAVENOUS | Status: AC
  Administered 2019-06-22: 15:00:00 1406 mg via INTRAVENOUS
  Filled 2019-06-22: qty 36.98

## 2019-06-22 MED ORDER — SODIUM CHLORIDE 0.9 % IV SOLN
Freq: Once | INTRAVENOUS | Status: AC
  Filled 2019-06-22: qty 250

## 2019-06-22 MED ORDER — HEPARIN SOD (PORK) LOCK FLUSH 100 UNIT/ML IV SOLN
500.0000 [IU] | Freq: Once | INTRAVENOUS | Status: AC | PRN
  Administered 2019-06-22: 500 [IU]
  Filled 2019-06-22: qty 5

## 2019-06-22 MED ORDER — SODIUM CHLORIDE 0.9% FLUSH
10.0000 mL | INTRAVENOUS | Status: DC | PRN
  Administered 2019-06-22: 10 mL
  Filled 2019-06-22: qty 10

## 2019-06-22 MED ORDER — PROCHLORPERAZINE MALEATE 10 MG PO TABS
10.0000 mg | ORAL_TABLET | Freq: Once | ORAL | Status: AC
  Administered 2019-06-22: 10 mg via ORAL

## 2019-06-22 MED ORDER — PACLITAXEL PROTEIN-BOUND CHEMO INJECTION 100 MG
100.0000 mg/m2 | Freq: Once | INTRAVENOUS | Status: AC
  Administered 2019-06-22: 150 mg via INTRAVENOUS
  Filled 2019-06-22: qty 30

## 2019-06-22 MED ORDER — PROCHLORPERAZINE MALEATE 10 MG PO TABS
ORAL_TABLET | ORAL | Status: AC
  Filled 2019-06-22: qty 1

## 2019-06-22 NOTE — Patient Instructions (Signed)
[facility] Discharge Instructions for Patients Receiving Chemotherapy  Today you received the following chemotherapy agents: Abraxane/Gemzar.  To help prevent nausea and vomiting after your treatment, we encourage you to take your nausea medication as directed.   If you develop nausea and vomiting that is not controlled by your nausea medication, call the clinic.   BELOW ARE SYMPTOMS THAT SHOULD BE REPORTED IMMEDIATELY:  *FEVER GREATER THAN 100.5 F  *CHILLS WITH OR WITHOUT FEVER  NAUSEA AND VOMITING THAT IS NOT CONTROLLED WITH YOUR NAUSEA MEDICATION  *UNUSUAL SHORTNESS OF BREATH  *UNUSUAL BRUISING OR BLEEDING  TENDERNESS IN MOUTH AND THROAT WITH OR WITHOUT PRESENCE OF ULCERS  *URINARY PROBLEMS  *BOWEL PROBLEMS  UNUSUAL RASH Items with * indicate a potential emergency and should be followed up as soon as possible.  Feel free to call the clinic should you have any questions or concerns. The clinic phone number is [phone].  Please show the CHEMO ALERT CARD at check-in to the Emergency Department and triage nurse.   

## 2019-06-22 NOTE — Patient Instructions (Signed)

## 2019-06-28 NOTE — Progress Notes (Signed)
Pharmacist Chemotherapy Monitoring - Follow Up Assessment    I verify that I have reviewed each item in the below checklist:  . Regimen for the patient is scheduled for the appropriate day and plan matches scheduled date. Marland Kitchen Appropriate non-routine labs are ordered dependent on drug ordered. . If applicable, additional medications reviewed and ordered per protocol based on lifetime cumulative doses and/or treatment regimen.   Plan for follow-up and/or issues identified: No . I-vent associated with next due treatment: No . MD and/or nursing notified: No  Britt Boozer 06/28/2019 10:43 AM

## 2019-07-04 ENCOUNTER — Inpatient Hospital Stay: Payer: Medicaid Other | Admitting: Nutrition

## 2019-07-04 ENCOUNTER — Encounter: Payer: Self-pay | Admitting: Hematology

## 2019-07-04 ENCOUNTER — Telehealth: Payer: Self-pay | Admitting: Hematology

## 2019-07-04 ENCOUNTER — Inpatient Hospital Stay: Payer: Medicaid Other

## 2019-07-04 ENCOUNTER — Inpatient Hospital Stay (HOSPITAL_BASED_OUTPATIENT_CLINIC_OR_DEPARTMENT_OTHER): Payer: Medicaid Other | Admitting: Hematology

## 2019-07-04 ENCOUNTER — Other Ambulatory Visit: Payer: Self-pay

## 2019-07-04 VITALS — BP 118/72 | HR 68 | Temp 98.3°F | Resp 18 | Ht 68.0 in | Wt 102.0 lb

## 2019-07-04 DIAGNOSIS — Z95828 Presence of other vascular implants and grafts: Secondary | ICD-10-CM

## 2019-07-04 DIAGNOSIS — D6481 Anemia due to antineoplastic chemotherapy: Secondary | ICD-10-CM

## 2019-07-04 DIAGNOSIS — C259 Malignant neoplasm of pancreas, unspecified: Secondary | ICD-10-CM

## 2019-07-04 DIAGNOSIS — Z5111 Encounter for antineoplastic chemotherapy: Secondary | ICD-10-CM | POA: Diagnosis not present

## 2019-07-04 DIAGNOSIS — E46 Unspecified protein-calorie malnutrition: Secondary | ICD-10-CM

## 2019-07-04 DIAGNOSIS — T451X5A Adverse effect of antineoplastic and immunosuppressive drugs, initial encounter: Secondary | ICD-10-CM

## 2019-07-04 DIAGNOSIS — G893 Neoplasm related pain (acute) (chronic): Secondary | ICD-10-CM

## 2019-07-04 DIAGNOSIS — Z800 Family history of malignant neoplasm of digestive organs: Secondary | ICD-10-CM

## 2019-07-04 LAB — CMP (CANCER CENTER ONLY)
ALT: 33 U/L (ref 0–44)
AST: 17 U/L (ref 15–41)
Albumin: 3.8 g/dL (ref 3.5–5.0)
Alkaline Phosphatase: 61 U/L (ref 38–126)
Anion gap: 15 (ref 5–15)
BUN: 6 mg/dL (ref 6–20)
CO2: 21 mmol/L — ABNORMAL LOW (ref 22–32)
Calcium: 8.6 mg/dL — ABNORMAL LOW (ref 8.9–10.3)
Chloride: 105 mmol/L (ref 98–111)
Creatinine: 0.96 mg/dL (ref 0.61–1.24)
GFR, Est AFR Am: >60 mL/min (ref >60)
GFR, Estimated: >60 mL/min (ref >60)
Glucose, Bld: 287 mg/dL — ABNORMAL HIGH (ref 70–99)
Potassium: 3.4 mmol/L — ABNORMAL LOW (ref 3.5–5.1)
Sodium: 141 mmol/L (ref 135–145)
Total Bilirubin: 0.3 mg/dL (ref 0.3–1.2)
Total Protein: 6.6 g/dL (ref 6.5–8.1)

## 2019-07-04 LAB — CBC WITH DIFFERENTIAL (CANCER CENTER ONLY)
Abs Immature Granulocytes: 0.01 10*3/uL (ref 0.00–0.07)
Basophils Absolute: 0 10*3/uL (ref 0.0–0.1)
Basophils Relative: 1 %
Eosinophils Absolute: 0.3 10*3/uL (ref 0.0–0.5)
Eosinophils Relative: 6 %
HCT: 38 % — ABNORMAL LOW (ref 39.0–52.0)
Hemoglobin: 12.2 g/dL — ABNORMAL LOW (ref 13.0–17.0)
Immature Granulocytes: 0 %
Lymphocytes Relative: 27 %
Lymphs Abs: 1.3 10*3/uL (ref 0.7–4.0)
MCH: 30.3 pg (ref 26.0–34.0)
MCHC: 32.1 g/dL (ref 30.0–36.0)
MCV: 94.3 fL (ref 80.0–100.0)
Monocytes Absolute: 0.2 10*3/uL (ref 0.1–1.0)
Monocytes Relative: 5 %
Neutro Abs: 2.9 10*3/uL (ref 1.7–7.7)
Neutrophils Relative %: 61 %
Platelet Count: 161 10*3/uL (ref 150–400)
RBC: 4.03 MIL/uL — ABNORMAL LOW (ref 4.22–5.81)
RDW: 13.3 % (ref 11.5–15.5)
WBC Count: 4.7 10*3/uL (ref 4.0–10.5)
nRBC: 0 % (ref 0.0–0.2)

## 2019-07-04 MED ORDER — PROCHLORPERAZINE MALEATE 10 MG PO TABS
ORAL_TABLET | ORAL | Status: AC
  Filled 2019-07-04: qty 1

## 2019-07-04 MED ORDER — SODIUM CHLORIDE 0.9 % IV SOLN
1000.0000 mg/m2 | Freq: Once | INTRAVENOUS | Status: AC
  Administered 2019-07-04: 1406 mg via INTRAVENOUS
  Filled 2019-07-04: qty 36.98

## 2019-07-04 MED ORDER — MORPHINE SULFATE ER 30 MG PO TBCR: 30.0000 mg | EXTENDED_RELEASE_TABLET | Freq: Two times a day (BID) | ORAL | 0 refills | Status: DC

## 2019-07-04 MED ORDER — PACLITAXEL PROTEIN-BOUND CHEMO INJECTION 100 MG
100.0000 mg/m2 | Freq: Once | INTRAVENOUS | Status: AC
  Administered 2019-07-04: 13:00:00 150 mg via INTRAVENOUS
  Filled 2019-07-04: qty 30

## 2019-07-04 MED ORDER — HEPARIN SOD (PORK) LOCK FLUSH 100 UNIT/ML IV SOLN
500.0000 [IU] | Freq: Once | INTRAVENOUS | Status: AC | PRN
  Administered 2019-07-04: 14:00:00 500 [IU]
  Filled 2019-07-04: qty 5

## 2019-07-04 MED ORDER — SODIUM CHLORIDE 0.9% FLUSH
10.0000 mL | INTRAVENOUS | Status: DC | PRN
  Administered 2019-07-04: 10 mL via INTRAVENOUS
  Filled 2019-07-04: qty 10

## 2019-07-04 MED ORDER — PROCHLORPERAZINE MALEATE 10 MG PO TABS
10.0000 mg | ORAL_TABLET | Freq: Once | ORAL | Status: AC
  Administered 2019-07-04: 10 mg via ORAL

## 2019-07-04 MED ORDER — SODIUM CHLORIDE 0.9 % IV SOLN
Freq: Once | INTRAVENOUS | Status: AC
  Filled 2019-07-04: qty 250

## 2019-07-04 MED ORDER — SODIUM CHLORIDE 0.9% FLUSH
10.0000 mL | INTRAVENOUS | Status: DC | PRN
  Administered 2019-07-04: 14:00:00 10 mL
  Filled 2019-07-04: qty 10

## 2019-07-04 NOTE — Progress Notes (Unsigned)
Nutrition follow-up completed with patient during infusion for pancreas cancer. Weight improved and documented as 102 pounds on March 31.  Patient remains underweight with BMI of 15.51. He denies nutrition impact symptoms and says everything is okay. Reports he continues to drink oral nutrition supplements but declines any samples or coupons.  Nutrition diagnosis: Underweight has improved.  Patient has my contact information and was encouraged to call me with any questions or concerns.  No follow-up has been scheduled.  **Disclaimer: This note was dictated with voice recognition software. Similar sounding words can inadvertently be transcribed and this note may contain transcription errors which may not have been corrected upon publication of note.**

## 2019-07-04 NOTE — Telephone Encounter (Signed)
Scheduled appts per 3/31 los. Gave pt a print out of appt calendar.

## 2019-07-04 NOTE — Progress Notes (Signed)
[place] [facility] OFFICE PROGRESS NOTE  Patient Care Team: Kerin Perna, NP as PCP - General (Internal Medicine)  HEME/ONC OVERVIEW: 1. 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, unresectable  -12/2018: 1.5cm mass in the pancreatic body surrounding the SMA and celiac axis   EUS showed a 2.4 x 1.9cm pancreatic body mass, involving the celiaci trunk, no peri-pancreatic adenopathy  Bx showed adenocarcinoma; insufficient tissue for molecular testing  -01/2019: PET showed FDG-avid pancreatic mass involving the celiac trunk, peri-aortic adenopathy; no definite metastatic disease  -Late 01/2019 - present: palliative gemcitabine/Abraxane   TREATMENT REGIMEN:  01/24/2019 - present: palliative gemcitabine/Abraxane, modified  ASSESSMENT & PLAN:   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 -Hgb 12.2 stable  -Patient denies any symptom of bleeding -Continue treatment as above   Abdominal pain -Secondary to vascular and nerve involvement by pancreatic cancer  -Currently on MS-Contin 15mg  BID and IR morphine 15mg  q6hrs PRN for pain control  -Due to the patient having to take IR morphine 3-4x/day for breakthrough pain, I have increased the MS-Contin to 30mg  BID  -Continue IR morphine for breakthrough pain   Protein malnutrition -Currently  drinking 2-3 Ensure per day and able to eat and drink without limitation -Weight relatively stable -I encouraged the patient to maintain adequate nutrition, and continue follow-up with nutrition  Family history of malignancy -Patient declined genetic evaluation despite extensive family hx of malignancy and several discussions  -We will continue to assess his willingness for genetic testing periodically  Orders Placed This Encounter  Procedures  . NM PET Image Restag (PS) Skull Base To Thigh    Standing Status:   Future    Standing Expiration Date:   07/03/2020    Scheduling Instructions:     Pls schedule prior to 07/27/2019    Order Specific Question:   ** REASON FOR EXAM (FREE TEXT)    Answer:   Assess disease status prior to considering SBRT    Order Specific Question:   If indicated for the ordered procedure, I authorize the administration of a radiopharmaceutical per Radiology protocol    Answer:   Yes    Order Specific Question:   Preferred imaging location?    Answer:   [facility]    Order Specific Question:   Radiology Contrast Protocol - do NOT remove file path    Answer:   [email or web address]   All questions were answered. The patient knows to call the clinic with any problems, questions or concerns. No barriers to learning was detected.  Return in 2 weeks for C5D15 of chemotherapy and 4 weeks for C6D1 of chemotherapy and clinic appt.   Gabriel Men, MD 3/31/202110:34 AM  CHIEF COMPLAINT: "I am doing pretty good"  INTERVAL HISTORY: Gabriel [name] returns to clinic for follow-up of locally advanced pancreatic adenocarcinoma on palliative gemcitabine/Abraxane.  Patient reports he has been tolerating treatment relatively well.  He is appetite is good, and he is able to eat without any restriction.  He also drinks 2-3 ensures per day.  He takes MS-Contin twice a day, with IR morphine for breakthrough pain (3-4 times per day) with adequate pain  control.  He denies any constipation.  He denies any other complaint today.  REVIEW OF SYSTEMS:   Constitutional: ( - ) fevers, ( - )  chills , ( - ) night sweats Eyes: ( - ) blurriness of vision, ( - ) double vision, ( - ) watery eyes Ears, nose, mouth, throat, and face: ( - ) mucositis, ( - ) sore throat Respiratory: ( - ) cough, ( - ) dyspnea, ( - ) wheezes Cardiovascular: ( - ) palpitation, ( - ) chest discomfort, ( - ) lower extremity swelling Gastrointestinal:  ( - ) nausea, ( - ) heartburn, ( - ) change in bowel habits Skin: ( - ) abnormal skin rashes Lymphatics: ( - ) new lymphadenopathy, ( - ) easy bruising Neurological: ( - ) numbness, ( - ) tingling, ( - ) new weaknesses Behavioral/Psych: ( - ) mood change, ( - ) new changes  All other systems were reviewed with the patient and are negative.  SUMMARY OF ONCOLOGIC HISTORY: Oncology History  Pancreatic adenocarcinoma [facility])   Initial Diagnosis   Pancreatic adenocarcinoma ([place])   12/15/2018 Imaging   CT abdomen/pelvis w/ contrast: IMPRESSION: 1. Suggestion of a 1.5 cm mass within the mid aspect of the pancreatic body with associated atrophy of the upstream pancreatic tail and associated pancreatic ductal dilatation. Recommend further evaluation with MRI. There is soft tissue fullness around the superior mesenteric artery and celiac axis. This may represent edema or potentially tumor involvement. 2. Tree-in-bud nodularity within the peripheral aspect of the lower lobes bilaterally which may be secondary to an infectious or inflammatory process. Recommend follow-up chest CT in 3 months to assess for interval change. 3. These results will be called to the ordering clinician or representative by the Radiologist Assistant, and communication documented in the PACS or zVision Dashboard.   12/29/2018 Procedure   EUS: -2.4 x 1.9cm mass in the body of the pancreas causing the main pancreatic duct obstruction and clearly involving  the celiac trunk. Biopsied. -Heterogenous soft tissue mass in the neck, measuring 3.6cm maximally. This was not sampled, presumed to be a large thyroid gland.  -No peripancreatic adenopathy -CBD was normal, non-dilated.   12/29/2018 Pathology Results   CASE: [id number]   DIAGNOSIS:  - Malignant cells consistent with adenocarcinoma  - See comment.   01/17/2019 Imaging   PET: IMPRESSION: 1. Suspected enlargement of the pancreatic mass, with abnormal hypermetabolic activity at the junction of the pancreatic body and tail currently measuring 3.4 by 2.9 by 2.4 cm. Although anatomic localization is hampered by the patient's extreme paucity of adipose tissue (making separation of adjacent structures difficult on noncontrast CT), there is thought to be a high likelihood tumor extending around the vicinity of the celiac trunk and possibly the superior mesenteric artery. In this case, MRI might provide better anatomic localization with respect to perivascular involvement. 2. Potential mild left periaortic adenopathy, maximum SUV of the indistinct left periaortic lymph nodes is approximately 3.3 which is above blood pool levels. 3. Scattered ground-glass density nodules in the lungs favoring the upper lobes, with tree-in-bud nodularity in the lung bases favoring the lower lobes. None of these lesions are hypermetabolic. While possibilities include atypical infectious process drug reaction, surveillance of the chest is recommended to exclude low-grade adenocarcinoma. 4.  Aortic Atherosclerosis (ICD10-I70.0).   01/24/2019 -  Chemotherapy  The patient had PACLitaxel-protein bound (ABRAXANE) chemo infusion 150 mg, 100 mg/m2 = 150 mg (100 % of original dose 100 mg/m2), Intravenous,  Once, 4 of 8 cycles Dose modification: 100 mg/m2 (original dose 100 mg/m2, Cycle 1, Reason: Provider Judgment) Administration: 150 mg (01/24/2019), 150 mg (02/14/2019), 150 mg (03/02/2019), 150 mg (03/16/2019),  150 mg (04/26/2019), 150 mg (05/09/2019), 150 mg (05/23/2019), 150 mg (06/22/2019) gemcitabine (GEMZAR) 1,102 mg in sodium chloride 0.9 % 250 mL chemo infusion, 800 mg/m2 = 1,102 mg (100 % of original dose 800 mg/m2), Intravenous,  Once, 4 of 8 cycles Dose modification: 800 mg/m2 (original dose 800 mg/m2, Cycle 1, Reason: Provider Judgment), 1,000 mg/m2 (original dose 800 mg/m2, Cycle 5, Reason: Provider Judgment) Administration: 1,102 mg (01/24/2019), 1,102 mg (02/14/2019), 1,102 mg (03/02/2019), 1,102 mg (03/16/2019), 1,102 mg (04/26/2019), 1,406 mg (05/09/2019), 1,406 mg (05/23/2019), 1,406 mg (06/22/2019)  for chemotherapy treatment.    02/28/2019 Cancer Staging   Staging form: Exocrine Pancreas, AJCC 8th Edition - Clinical: Stage IB (cT2, cN0, cM0) - Signed by Gabriel Men, MD on 02/28/2019   04/16/2019 Imaging   CT CAP: IMPRESSION: 1. Margins of pancreatic neoplasm are difficult to identify. Similar appearance of soft tissue infiltration within the retroperitoneum with progressive narrowing of the portal venous confluence by tumor. No specific findings of solid organ metastasis or nodal metastasis within the abdomen or pelvis. 2. Unchanged appearance of multifocal sub solid nodules within the upper lung zones and bilateral lower lung zone tree-in-bud nodularity. As mentioned previously findings are nonspecific and may be inflammatory or infectious in etiology. Metastatic disease is not excluded. 3. Aortic atherosclerosis. 4. Right lobe of thyroid gland nodule. Consider further evaluation with thyroid ultrasound. If patient is clinically hyperthyroid, consider nuclear medicine thyroid uptake and scan.     I have reviewed the past medical history, past surgical history, social history and family history with the patient and they are unchanged from previous note.  ALLERGIES:  is allergic to penicillins.  MEDICATIONS:  Current Outpatient Medications  Medication Sig Dispense Refill  .  cyclobenzaprine (FLEXERIL) 10 MG tablet Take 10 mg by mouth 3 (three) times daily as needed for muscle spasms.    . diphenhydramine-acetaminophen (TYLENOL PM) 25-500 MG TABS tablet Take 2 tablets by mouth at bedtime as needed (sleep).    Marland Kitchen lidocaine-prilocaine (EMLA) cream Apply to affected area once 30 g 3  . LORazepam (ATIVAN) 0.5 MG tablet Take 1 tablet (0.5 mg total) by mouth every 6 (six) hours as needed (Nausea or vomiting). 30 tablet 0  . morphine (MS CONTIN) 15 MG 12 hr tablet Take 1 tablet (15 mg total) by mouth every 12 (twelve) hours. 60 tablet 0  . morphine (MSIR) 15 MG tablet Take 1 tablet (15 mg total) by mouth every 6 (six) hours as needed for severe pain. 60 tablet 0  . ondansetron (ZOFRAN) 8 MG tablet Take 1 tablet (8 mg total) by mouth 2 (two) times daily as needed (Nausea or vomiting). 30 tablet 1  . polyethylene glycol (MIRALAX / GLYCOLAX) 17 g packet Take 17 g by mouth daily.    . prochlorperazine (COMPAZINE) 10 MG tablet Take 1 tablet (10 mg total) by mouth every 6 (six) hours as needed (Nausea or vomiting). (Patient not taking: Reported on 05/09/2019) 30 tablet 1  . senna-docusate (SENNA S) 8.6-50 MG tablet Take 2 tablets by mouth at bedtime.     No current facility-administered medications for this visit.   Facility-Administered Medications Ordered in Other Visits  Medication  Dose Route Frequency Provider Last Rate Last Admin  . oxyCODONE-acetaminophen (PERCOCET/ROXICET) 5-325 MG per tablet 2 tablet  2 tablet Oral Once Gabriel Men, MD      . potassium chloride SA (KLOR-CON) CR tablet 40 mEq  40 mEq Oral Once Gabriel Men, MD        PHYSICAL EXAMINATION: ECOG PERFORMANCE STATUS: 2 - Symptomatic, <50% confined to bed  Today's Vitals   07/04/19 1029  BP: 118/72  Pulse: 68  Resp: 18  Temp: 98.3 F (36.8 C)  TempSrc: Oral  SpO2: 100%  Weight: 102 lb (46.3 kg)  Height: 5\' 8"  (1.727 m)   Body mass index is 15.51 kg/m.  Filed Weights   07/04/19 1029  Weight: 102 lb  (46.3 kg)    GENERAL: alert, no distress and comfortable, very thin  SKIN: skin color, texture, turgor are normal, no rashes or significant lesions EYES: conjunctiva are pink and non-injected, sclera clear OROPHARYNX: no exudate, no erythema; lips, buccal mucosa, and tongue normal  NECK: supple, non-tender LUNGS: clear to auscultation with normal breathing effort HEART: regular rate & rhythm and no murmurs and no lower extremity edema ABDOMEN: soft, non-tender, non-distended, normal bowel sounds Musculoskeletal: no cyanosis of digits and no clubbing  PSYCH: alert & oriented x 3, fluent speech  LABORATORY DATA:  I have reviewed the data as listed    Component Value Date/Time   NA 138 06/22/2019 1200   K 3.9 06/22/2019 1200   CL 105 06/22/2019 1200   CO2 26 06/22/2019 1200   GLUCOSE 90 06/22/2019 1200   BUN 6 06/22/2019 1200   CREATININE 0.67 06/22/2019 1200   CALCIUM 8.7 (L) 06/22/2019 1200   PROT 6.8 06/22/2019 1200   ALBUMIN 4.0 06/22/2019 1200   AST 14 (L) 06/22/2019 1200   ALT 29 06/22/2019 1200   ALKPHOS 61 06/22/2019 1200   BILITOT 0.4 06/22/2019 1200   GFRNONAA >60 06/22/2019 1200   GFRAA >60 06/22/2019 1200    No results found for: SPEP, UPEP  Lab Results  Component Value Date   WBC 4.7 07/04/2019   NEUTROABS 2.9 07/04/2019   HGB 12.2 (L) 07/04/2019   HCT 38.0 (L) 07/04/2019   MCV 94.3 07/04/2019   PLT 161 07/04/2019      Chemistry      Component Value Date/Time   NA 138 06/22/2019 1200   K 3.9 06/22/2019 1200   CL 105 06/22/2019 1200   CO2 26 06/22/2019 1200   BUN 6 06/22/2019 1200   CREATININE 0.67 06/22/2019 1200      Component Value Date/Time   CALCIUM 8.7 (L) 06/22/2019 1200   ALKPHOS 61 06/22/2019 1200   AST 14 (L) 06/22/2019 1200   ALT 29 06/22/2019 1200   BILITOT 0.4 06/22/2019 1200       RADIOGRAPHIC STUDIES: I have personally reviewed the radiological images as listed below and agreed with the findings in the report. No results  found.

## 2019-07-04 NOTE — Patient Instructions (Signed)

## 2019-07-04 NOTE — Patient Instructions (Signed)
[facility] Discharge Instructions for Patients Receiving Chemotherapy  Today you received the following chemotherapy agents: abraxane and gemcitabine.  To help prevent nausea and vomiting after your treatment, we encourage you to take your nausea medication as directed.   If you develop nausea and vomiting that is not controlled by your nausea medication, call the clinic.   BELOW ARE SYMPTOMS THAT SHOULD BE REPORTED IMMEDIATELY:  *FEVER GREATER THAN 100.5 F  *CHILLS WITH OR WITHOUT FEVER  NAUSEA AND VOMITING THAT IS NOT CONTROLLED WITH YOUR NAUSEA MEDICATION  *UNUSUAL SHORTNESS OF BREATH  *UNUSUAL BRUISING OR BLEEDING  TENDERNESS IN MOUTH AND THROAT WITH OR WITHOUT PRESENCE OF ULCERS  *URINARY PROBLEMS  *BOWEL PROBLEMS  UNUSUAL RASH Items with * indicate a potential emergency and should be followed up as soon as possible.  Feel free to call the clinic should you have any questions or concerns. The clinic phone number is [phone].  Please show the CHEMO ALERT CARD at check-in to the Emergency Department and triage nurse.   

## 2019-07-13 NOTE — Progress Notes (Signed)
Pharmacist Chemotherapy Monitoring - Follow Up Assessment    I verify that I have reviewed each item in the below checklist:  . Regimen for the patient is scheduled for the appropriate day and plan matches scheduled date. Marland Kitchen Appropriate non-routine labs are ordered dependent on drug ordered. . If applicable, additional medications reviewed and ordered per protocol based on lifetime cumulative doses and/or treatment regimen.   Plan for follow-up and/or issues identified: No . I-vent associated with next due treatment: No . MD and/or nursing notified: No  Gabriel [name] [facility] 07/13/2019 10:00 AM

## 2019-07-17 ENCOUNTER — Telehealth: Payer: Self-pay

## 2019-07-17 NOTE — Telephone Encounter (Signed)
Please see encounter note below.

## 2019-07-17 NOTE — Telephone Encounter (Signed)
-----   Message from Tish Men, MD sent at 07/17/2019  1:57 PM EDT ----- Faythe Ghee. I would give it a week to see if the higher dose of medication helps.  Thanks.  [place]  ----- Message ----- From: Tami Lin, RN Sent: 07/17/2019   1:19 PM EDT To: Tish Men, MD  Patient has not been taking the increased dose. Per family member patient was finishing the prior prescription. Patient will take MS Contin 30 mg starting this evening and the family member will call back if patient still has pain after a couple of days.  ----- Message ----- From: Tish Men, MD Sent: 07/17/2019   1:11 PM EDT To: Tami Lin, RN  I have just recently increased his MS-Contin to 30mg  BID. If he has been taking the increased dose of MS-Contin and still having pain, I can refer him to radiation oncology for consideration of SBRT.  Thanks. [place]  ----- Message ----- From: Tami Lin, RN Sent: 07/17/2019   1:06 PM EDT To: Tish Men, MD  Patient's family member called and wanted to make you aware that patient is having pain despite taking MS Contin and MS IR. The pain is on the right side and has been noticeable x 1 week. She said she is not sure if patient is relaying this information to you when he comes for visits. She states the patient wakes up crying at night due to pain and she wanted to make you aware. Patient has an infusion appointment scheduled for tomorrow but not a visit with you.  Thanks, Lanelle Bal, Therapist, sports

## 2019-07-18 ENCOUNTER — Inpatient Hospital Stay: Payer: Medicaid Other

## 2019-07-18 ENCOUNTER — Other Ambulatory Visit: Payer: Self-pay

## 2019-07-18 ENCOUNTER — Inpatient Hospital Stay: Payer: Medicaid Other | Attending: Hematology

## 2019-07-18 VITALS — BP 108/77 | HR 60 | Temp 98.2°F | Resp 18 | Ht 68.0 in | Wt 97.7 lb

## 2019-07-18 DIAGNOSIS — T451X5A Adverse effect of antineoplastic and immunosuppressive drugs, initial encounter: Secondary | ICD-10-CM | POA: Diagnosis not present

## 2019-07-18 DIAGNOSIS — E46 Unspecified protein-calorie malnutrition: Secondary | ICD-10-CM | POA: Insufficient documentation

## 2019-07-18 DIAGNOSIS — D6481 Anemia due to antineoplastic chemotherapy: Secondary | ICD-10-CM | POA: Insufficient documentation

## 2019-07-18 DIAGNOSIS — Z5111 Encounter for antineoplastic chemotherapy: Secondary | ICD-10-CM | POA: Insufficient documentation

## 2019-07-18 DIAGNOSIS — C259 Malignant neoplasm of pancreas, unspecified: Secondary | ICD-10-CM

## 2019-07-18 DIAGNOSIS — Z79899 Other long term (current) drug therapy: Secondary | ICD-10-CM | POA: Insufficient documentation

## 2019-07-18 DIAGNOSIS — C251 Malignant neoplasm of body of pancreas: Secondary | ICD-10-CM | POA: Diagnosis present

## 2019-07-18 DIAGNOSIS — Z95828 Presence of other vascular implants and grafts: Secondary | ICD-10-CM

## 2019-07-18 LAB — CBC WITH DIFFERENTIAL (CANCER CENTER ONLY)
Abs Immature Granulocytes: 0.01 10*3/uL (ref 0.00–0.07)
Basophils Absolute: 0 10*3/uL (ref 0.0–0.1)
Basophils Relative: 1 %
Eosinophils Absolute: 0.2 10*3/uL (ref 0.0–0.5)
Eosinophils Relative: 4 %
HCT: 36.2 % — ABNORMAL LOW (ref 39.0–52.0)
Hemoglobin: 11.6 g/dL — ABNORMAL LOW (ref 13.0–17.0)
Immature Granulocytes: 0 %
Lymphocytes Relative: 26 %
Lymphs Abs: 1.3 10*3/uL (ref 0.7–4.0)
MCH: 29.8 pg (ref 26.0–34.0)
MCHC: 32 g/dL (ref 30.0–36.0)
MCV: 93.1 fL (ref 80.0–100.0)
Monocytes Absolute: 0.5 10*3/uL (ref 0.1–1.0)
Monocytes Relative: 11 %
Neutro Abs: 2.9 10*3/uL (ref 1.7–7.7)
Neutrophils Relative %: 58 %
Platelet Count: 187 10*3/uL (ref 150–400)
RBC: 3.89 MIL/uL — ABNORMAL LOW (ref 4.22–5.81)
RDW: 13.8 % (ref 11.5–15.5)
WBC Count: 4.9 10*3/uL (ref 4.0–10.5)
nRBC: 0 % (ref 0.0–0.2)

## 2019-07-18 LAB — CMP (CANCER CENTER ONLY)
ALT: 36 U/L (ref 0–44)
AST: 13 U/L — ABNORMAL LOW (ref 15–41)
Albumin: 3.8 g/dL (ref 3.5–5.0)
Alkaline Phosphatase: 57 U/L (ref 38–126)
Anion gap: 10 (ref 5–15)
BUN: 7 mg/dL (ref 6–20)
CO2: 27 mmol/L (ref 22–32)
Calcium: 8.6 mg/dL — ABNORMAL LOW (ref 8.9–10.3)
Chloride: 105 mmol/L (ref 98–111)
Creatinine: 0.72 mg/dL (ref 0.61–1.24)
GFR, Est AFR Am: >60 mL/min (ref >60)
GFR, Estimated: >60 mL/min (ref >60)
Glucose, Bld: 100 mg/dL — ABNORMAL HIGH (ref 70–99)
Potassium: 3.4 mmol/L — ABNORMAL LOW (ref 3.5–5.1)
Sodium: 142 mmol/L (ref 135–145)
Total Bilirubin: 0.2 mg/dL — ABNORMAL LOW (ref 0.3–1.2)
Total Protein: 6.7 g/dL (ref 6.5–8.1)

## 2019-07-18 MED ORDER — HEPARIN SOD (PORK) LOCK FLUSH 100 UNIT/ML IV SOLN
500.0000 [IU] | Freq: Once | INTRAVENOUS | Status: AC | PRN
  Administered 2019-07-18: 500 [IU]
  Filled 2019-07-18: qty 5

## 2019-07-18 MED ORDER — PACLITAXEL PROTEIN-BOUND CHEMO INJECTION 100 MG
100.0000 mg/m2 | Freq: Once | INTRAVENOUS | Status: AC
  Administered 2019-07-18: 150 mg via INTRAVENOUS
  Filled 2019-07-18: qty 30

## 2019-07-18 MED ORDER — SODIUM CHLORIDE 0.9 % IV SOLN
Freq: Once | INTRAVENOUS | Status: AC
  Filled 2019-07-18: qty 250

## 2019-07-18 MED ORDER — SODIUM CHLORIDE 0.9% FLUSH
10.0000 mL | INTRAVENOUS | Status: DC | PRN
  Administered 2019-07-18 (×2): 10 mL
  Filled 2019-07-18: qty 10

## 2019-07-18 MED ORDER — PROCHLORPERAZINE MALEATE 10 MG PO TABS
ORAL_TABLET | ORAL | Status: AC
  Filled 2019-07-18: qty 1

## 2019-07-18 MED ORDER — SODIUM CHLORIDE 0.9% FLUSH
10.0000 mL | INTRAVENOUS | Status: DC | PRN
  Filled 2019-07-18: qty 10

## 2019-07-18 MED ORDER — PROCHLORPERAZINE MALEATE 10 MG PO TABS
10.0000 mg | ORAL_TABLET | Freq: Once | ORAL | Status: AC
  Administered 2019-07-18: 10 mg via ORAL

## 2019-07-18 MED ORDER — SODIUM CHLORIDE 0.9 % IV SOLN
1000.0000 mg/m2 | Freq: Once | INTRAVENOUS | Status: AC
  Administered 2019-07-18: 1406 mg via INTRAVENOUS
  Filled 2019-07-18: qty 36.98

## 2019-07-18 NOTE — Patient Instructions (Signed)

## 2019-07-18 NOTE — Patient Instructions (Signed)
[facility] Discharge Instructions for Patients Receiving Chemotherapy  Today you received the following chemotherapy agents: Abraxane, Gemzar   To help prevent nausea and vomiting after your treatment, we encourage you to take your nausea medication as directed.    If you develop nausea and vomiting that is not controlled by your nausea medication, call the clinic.   BELOW ARE SYMPTOMS THAT SHOULD BE REPORTED IMMEDIATELY:  *FEVER GREATER THAN 100.5 F  *CHILLS WITH OR WITHOUT FEVER  NAUSEA AND VOMITING THAT IS NOT CONTROLLED WITH YOUR NAUSEA MEDICATION  *UNUSUAL SHORTNESS OF BREATH  *UNUSUAL BRUISING OR BLEEDING  TENDERNESS IN MOUTH AND THROAT WITH OR WITHOUT PRESENCE OF ULCERS  *URINARY PROBLEMS  *BOWEL PROBLEMS  UNUSUAL RASH Items with * indicate a potential emergency and should be followed up as soon as possible.  Feel free to call the clinic should you have any questions or concerns. The clinic phone number is [phone].  Please show the CHEMO ALERT CARD at check-in to the Emergency Department and triage nurse.   

## 2019-07-23 ENCOUNTER — Ambulatory Visit (HOSPITAL_COMMUNITY): Admission: RE | Admit: 2019-07-23 | Payer: Self-pay | Source: Ambulatory Visit

## 2019-07-26 NOTE — Progress Notes (Signed)
Pharmacist Chemotherapy Monitoring - Follow Up Assessment    I verify that I have reviewed each item in the below checklist:  . Regimen for the patient is scheduled for the appropriate day and plan matches scheduled date. Marland Kitchen Appropriate non-routine labs are ordered dependent on drug ordered. . If applicable, additional medications reviewed and ordered per protocol based on lifetime cumulative doses and/or treatment regimen.   Plan for follow-up and/or issues identified: Yes . I-vent associated with next due treatment: Yes . MD and/or nursing notified: No  Gabriel [name] 07/26/2019 1:43 PM

## 2019-08-01 ENCOUNTER — Inpatient Hospital Stay: Payer: Medicaid Other

## 2019-08-01 ENCOUNTER — Inpatient Hospital Stay: Payer: Medicaid Other | Admitting: Hematology

## 2019-08-02 ENCOUNTER — Telehealth: Payer: Self-pay | Admitting: Hematology

## 2019-08-02 NOTE — Telephone Encounter (Signed)
R/s appt per 4/28 sch message - pt is aware of 5/5 appts

## 2019-08-08 ENCOUNTER — Inpatient Hospital Stay: Payer: Medicaid Other | Attending: Hematology | Admitting: Hematology

## 2019-08-08 ENCOUNTER — Encounter: Payer: Self-pay | Admitting: Hematology

## 2019-08-08 ENCOUNTER — Telehealth: Payer: Self-pay | Admitting: Hematology

## 2019-08-08 ENCOUNTER — Other Ambulatory Visit: Payer: Self-pay

## 2019-08-08 ENCOUNTER — Inpatient Hospital Stay: Payer: Medicaid Other

## 2019-08-08 VITALS — BP 112/82 | HR 70 | Temp 98.5°F | Resp 16 | Ht 68.0 in | Wt 96.9 lb

## 2019-08-08 DIAGNOSIS — C251 Malignant neoplasm of body of pancreas: Secondary | ICD-10-CM | POA: Diagnosis present

## 2019-08-08 DIAGNOSIS — Z79899 Other long term (current) drug therapy: Secondary | ICD-10-CM | POA: Diagnosis not present

## 2019-08-08 DIAGNOSIS — Z5111 Encounter for antineoplastic chemotherapy: Secondary | ICD-10-CM | POA: Insufficient documentation

## 2019-08-08 DIAGNOSIS — E46 Unspecified protein-calorie malnutrition: Secondary | ICD-10-CM | POA: Insufficient documentation

## 2019-08-08 DIAGNOSIS — C259 Malignant neoplasm of pancreas, unspecified: Secondary | ICD-10-CM

## 2019-08-08 DIAGNOSIS — D6481 Anemia due to antineoplastic chemotherapy: Secondary | ICD-10-CM | POA: Diagnosis not present

## 2019-08-08 DIAGNOSIS — T451X5A Adverse effect of antineoplastic and immunosuppressive drugs, initial encounter: Secondary | ICD-10-CM | POA: Diagnosis not present

## 2019-08-08 DIAGNOSIS — G893 Neoplasm related pain (acute) (chronic): Secondary | ICD-10-CM | POA: Insufficient documentation

## 2019-08-08 DIAGNOSIS — Z95828 Presence of other vascular implants and grafts: Secondary | ICD-10-CM

## 2019-08-08 LAB — CBC WITH DIFFERENTIAL (CANCER CENTER ONLY)
Abs Immature Granulocytes: 0.03 10*3/uL (ref 0.00–0.07)
Basophils Absolute: 0.1 10*3/uL (ref 0.0–0.1)
Basophils Relative: 1 %
Eosinophils Absolute: 0.2 10*3/uL (ref 0.0–0.5)
Eosinophils Relative: 4 %
HCT: 38.2 % — ABNORMAL LOW (ref 39.0–52.0)
Hemoglobin: 12.4 g/dL — ABNORMAL LOW (ref 13.0–17.0)
Immature Granulocytes: 1 %
Lymphocytes Relative: 27 %
Lymphs Abs: 1.6 10*3/uL (ref 0.7–4.0)
MCH: 29.3 pg (ref 26.0–34.0)
MCHC: 32.5 g/dL (ref 30.0–36.0)
MCV: 90.3 fL (ref 80.0–100.0)
Monocytes Absolute: 0.6 10*3/uL (ref 0.1–1.0)
Monocytes Relative: 10 %
Neutro Abs: 3.4 10*3/uL (ref 1.7–7.7)
Neutrophils Relative %: 57 %
Platelet Count: 426 10*3/uL — ABNORMAL HIGH (ref 150–400)
RBC: 4.23 MIL/uL (ref 4.22–5.81)
RDW: 13.9 % (ref 11.5–15.5)
WBC Count: 5.9 10*3/uL (ref 4.0–10.5)
nRBC: 0 % (ref 0.0–0.2)

## 2019-08-08 LAB — CMP (CANCER CENTER ONLY)
ALT: 31 U/L (ref 0–44)
AST: 15 U/L (ref 15–41)
Albumin: 4 g/dL (ref 3.5–5.0)
Alkaline Phosphatase: 67 U/L (ref 38–126)
Anion gap: 9 (ref 5–15)
BUN: 7 mg/dL (ref 6–20)
CO2: 30 mmol/L (ref 22–32)
Calcium: 9.1 mg/dL (ref 8.9–10.3)
Chloride: 101 mmol/L (ref 98–111)
Creatinine: 0.75 mg/dL (ref 0.61–1.24)
GFR, Est AFR Am: >60 mL/min (ref >60)
GFR, Estimated: >60 mL/min (ref >60)
Glucose, Bld: 96 mg/dL (ref 70–99)
Potassium: 3.4 mmol/L — ABNORMAL LOW (ref 3.5–5.1)
Sodium: 140 mmol/L (ref 135–145)
Total Bilirubin: 0.3 mg/dL (ref 0.3–1.2)
Total Protein: 7.1 g/dL (ref 6.5–8.1)

## 2019-08-08 MED ORDER — PACLITAXEL PROTEIN-BOUND CHEMO INJECTION 100 MG
100.0000 mg/m2 | Freq: Once | INTRAVENOUS | Status: AC
  Administered 2019-08-08: 150 mg via INTRAVENOUS
  Filled 2019-08-08: qty 30

## 2019-08-08 MED ORDER — SODIUM CHLORIDE 0.9 % IV SOLN
Freq: Once | INTRAVENOUS | Status: AC
  Filled 2019-08-08: qty 250

## 2019-08-08 MED ORDER — MORPHINE SULFATE ER 30 MG PO TBCR: 30.0000 mg | EXTENDED_RELEASE_TABLET | Freq: Two times a day (BID) | ORAL | 0 refills | Status: AC

## 2019-08-08 MED ORDER — PROCHLORPERAZINE MALEATE 10 MG PO TABS
10.0000 mg | ORAL_TABLET | Freq: Once | ORAL | Status: AC
  Administered 2019-08-08: 10 mg via ORAL

## 2019-08-08 MED ORDER — SODIUM CHLORIDE 0.9 % IV SOLN
1000.0000 mg/m2 | Freq: Once | INTRAVENOUS | Status: AC
  Administered 2019-08-08: 1406 mg via INTRAVENOUS
  Filled 2019-08-08: qty 36.98

## 2019-08-08 MED ORDER — PROCHLORPERAZINE MALEATE 10 MG PO TABS
ORAL_TABLET | ORAL | Status: AC
  Filled 2019-08-08: qty 1

## 2019-08-08 MED ORDER — SODIUM CHLORIDE 0.9% FLUSH
10.0000 mL | INTRAVENOUS | Status: DC | PRN
  Administered 2019-08-08: 10 mL
  Filled 2019-08-08: qty 10

## 2019-08-08 MED ORDER — SODIUM CHLORIDE 0.9% FLUSH
10.0000 mL | INTRAVENOUS | Status: DC | PRN
  Administered 2019-08-08: 10 mL via INTRAVENOUS
  Filled 2019-08-08: qty 10

## 2019-08-08 MED ORDER — MORPHINE SULFATE 15 MG PO TABS: 15.0000 mg | ORAL_TABLET | Freq: Four times a day (QID) | ORAL | 0 refills | Status: DC | PRN

## 2019-08-08 MED ORDER — HEPARIN SOD (PORK) LOCK FLUSH 100 UNIT/ML IV SOLN
500.0000 [IU] | Freq: Once | INTRAVENOUS | Status: AC | PRN
  Administered 2019-08-08: 500 [IU]
  Filled 2019-08-08: qty 5

## 2019-08-08 NOTE — Patient Instructions (Signed)
[facility] Discharge Instructions for Patients Receiving Chemotherapy  Today you received the following chemotherapy agents Paclitaxel-protein (ABRAXANE) & Gemcitabine (GEMZAR).  To help prevent nausea and vomiting after your treatment, we encourage you to take your nausea medication as prescribed.   If you develop nausea and vomiting that is not controlled by your nausea medication, call the clinic.   BELOW ARE SYMPTOMS THAT SHOULD BE REPORTED IMMEDIATELY:  *FEVER GREATER THAN 100.5 F  *CHILLS WITH OR WITHOUT FEVER  NAUSEA AND VOMITING THAT IS NOT CONTROLLED WITH YOUR NAUSEA MEDICATION  *UNUSUAL SHORTNESS OF BREATH  *UNUSUAL BRUISING OR BLEEDING  TENDERNESS IN MOUTH AND THROAT WITH OR WITHOUT PRESENCE OF ULCERS  *URINARY PROBLEMS  *BOWEL PROBLEMS  UNUSUAL RASH Items with * indicate a potential emergency and should be followed up as soon as possible.  Feel free to call the clinic should you have any questions or concerns. The clinic phone number is [phone].  Please show the CHEMO ALERT CARD at check-in to the Emergency Department and triage nurse.   

## 2019-08-08 NOTE — Telephone Encounter (Signed)
Scheduled appt per 5/5 los - gave patient AVS and calender  

## 2019-08-08 NOTE — Progress Notes (Signed)
[place] OFFICE PROGRESS NOTE  Patient Care Team: Kerin Perna, NP as PCP - General (Internal Medicine)  HEME/ONC OVERVIEW: 1. 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, unresectable  -12/2018: 1.5cm mass in the pancreatic body surrounding the SMA and celiac axis   EUS showed a 2.4 x 1.9cm pancreatic body mass, involving the celiaci trunk, no peri-pancreatic adenopathy  Bx showed adenocarcinoma; insufficient tissue for molecular testing  -01/2019: PET showed FDG-avid pancreatic mass involving the celiac trunk, peri-aortic adenopathy; no definite metastatic disease  -Late 01/2019 - present: palliative gemcitabine/Abraxane   TREATMENT REGIMEN:  01/24/2019 - present: palliative gemcitabine/Abraxane, modified  ASSESSMENT & PLAN:   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  However, patient did not schedule the study.  -I asked the RN to assist with PET scheduling, and is currently scheduled for 08/20/2019.  Pending PET results, we will determine if he may be a candidate for other treatment options.   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 -Hgb 12.4, stable  -Patient denies any symptom of bleeding -Continue treatment as above   Abdominal pain -Secondary to vascular and nerve involvement by pancreatic cancer  -Currently on MS-Contin 30mg  BID and IR morphine 15mg  q6hrs PRN for pain control   -Pain relatively well controlled  -Continue the current regimen; I have refilled both MS-Contin and IR morphine today   Protein malnutrition -Currently drinking 2-3 Ensure per day and able to eat and drink without limitation -Weight relatively stable -I encouraged the patient to maintain adequate nutrition, and continue follow-up with nutrition  Family history of malignancy -Patient declined genetic evaluation despite extensive family hx of malignancy and several discussions  -We will continue to assess his willingness for genetic testing periodically  No orders of the defined types were placed in this encounter.  All questions were answered. The patient knows to call the clinic with any problems, questions or concerns. No barriers to learning was detected.  Return in 2 weeks for C6D15 of chemotherapy, and 4 weeks for C7D1 of chemotherapy and clinic appt with Dr. Benay Spice.   Tish Men, MD 5/5/202111:27 AM  CHIEF COMPLAINT: "I am doing fine"  INTERVAL HISTORY: [name] returns to clinic for follow-up of locally advanced, unresectable pancreatic cancer on gemcitabine/Abraxane.  The patient missed his last week of treatment due to childcare issues.  He had one episode of diarrhea that lasted 1 day, which he attributed to food poisoning, but he has not had any recurrent GI symptoms since then.  His pain is well controlled on MS-Contin 30 mg BID, and only requires IR morphine 1-2 times a day for breakthrough pain as needed.  His weight is stable, and he drinks a few Ensure per day in addition to regular diet.  He denies any other complaint today.  REVIEW OF SYSTEMS:   Constitutional: ( - ) fevers, ( - )  chills , ( - ) night sweats Eyes: ( - ) blurriness of vision, ( - ) double vision, ( - ) watery eyes Ears, nose, mouth, throat, and  face: ( - ) mucositis, ( - ) sore throat Respiratory: ( - ) cough, ( - ) dyspnea, ( - ) wheezes Cardiovascular: ( - ) palpitation, ( - ) chest  discomfort, ( - ) lower extremity swelling Gastrointestinal:  ( - ) nausea, ( - ) heartburn, ( + ) change in bowel habits Skin: ( - ) abnormal skin rashes Lymphatics: ( - ) new lymphadenopathy, ( - ) easy bruising Neurological: ( - ) numbness, ( - ) tingling, ( - ) new weaknesses Behavioral/Psych: ( - ) mood change, ( - ) new changes  All other systems were reviewed with the patient and are negative.  SUMMARY OF ONCOLOGIC HISTORY: Oncology History  Pancreatic adenocarcinoma [facility])   Initial Diagnosis   Pancreatic adenocarcinoma ([place])   12/15/2018 Imaging   CT abdomen/pelvis w/ contrast: IMPRESSION: 1. Suggestion of a 1.5 cm mass within the mid aspect of the pancreatic body with associated atrophy of the upstream pancreatic tail and associated pancreatic ductal dilatation. Recommend further evaluation with MRI. There is soft tissue fullness around the superior mesenteric artery and celiac axis. This may represent edema or potentially tumor involvement. 2. Tree-in-bud nodularity within the peripheral aspect of the lower lobes bilaterally which may be secondary to an infectious or inflammatory process. Recommend follow-up chest CT in 3 months to assess for interval change. 3. These results will be called to the ordering clinician or representative by the Radiologist Assistant, and communication documented in the PACS or zVision Dashboard.   12/29/2018 Procedure   EUS: -2.4 x 1.9cm mass in the body of the pancreas causing the main pancreatic duct obstruction and clearly involving the celiac trunk. Biopsied. -Heterogenous soft tissue mass in the neck, measuring 3.6cm maximally. This was not sampled, presumed to be a large thyroid gland.  -No peripancreatic adenopathy -CBD was normal, non-dilated.   12/29/2018 Pathology Results   CASE: [id number]   DIAGNOSIS:  - Malignant cells consistent with adenocarcinoma  - See comment.   01/17/2019 Imaging   PET: IMPRESSION: 1. Suspected  enlargement of the pancreatic mass, with abnormal hypermetabolic activity at the junction of the pancreatic body and tail currently measuring 3.4 by 2.9 by 2.4 cm. Although anatomic localization is hampered by the patient's extreme paucity of adipose tissue (making separation of adjacent structures difficult on noncontrast CT), there is thought to be a high likelihood tumor extending around the vicinity of the celiac trunk and possibly the superior mesenteric artery. In this case, MRI might provide better anatomic localization with respect to perivascular involvement. 2. Potential mild left periaortic adenopathy, maximum SUV of the indistinct left periaortic lymph nodes is approximately 3.3 which is above blood pool levels. 3. Scattered ground-glass density nodules in the lungs favoring the upper lobes, with tree-in-bud nodularity in the lung bases favoring the lower lobes. None of these lesions are hypermetabolic. While possibilities include atypical infectious process drug reaction, surveillance of the chest is recommended to exclude low-grade adenocarcinoma. 4.  Aortic Atherosclerosis (ICD10-I70.0).   01/24/2019 -  Chemotherapy   The patient had PACLitaxel-protein bound (ABRAXANE) chemo infusion 150 mg, 100 mg/m2 = 150 mg (100 % of original dose 100 mg/m2), Intravenous,  Once, 5 of 8 cycles Dose modification: 100 mg/m2 (original dose 100 mg/m2, Cycle 1, Reason: Provider Judgment) Administration: 150 mg (01/24/2019), 150 mg (02/14/2019), 150 mg (03/02/2019), 150 mg (03/16/2019), 150 mg (04/26/2019), 150 mg (07/04/2019), 150 mg (07/18/2019), 150 mg (05/09/2019), 150 mg (05/23/2019), 150 mg (06/22/2019) gemcitabine (GEMZAR) 1,102 mg in sodium chloride 0.9 %  250 mL chemo infusion, 800 mg/m2 = 1,102 mg (100 % of original dose 800 mg/m2), Intravenous,  Once, 5 of 8 cycles Dose modification: 800 mg/m2 (original dose 800 mg/m2, Cycle 1, Reason: Provider Judgment), 1,000 mg/m2 (original dose 800 mg/m2,  Cycle 5, Reason: Provider Judgment) Administration: 1,102 mg (01/24/2019), 1,102 mg (02/14/2019), 1,102 mg (03/02/2019), 1,102 mg (03/16/2019), 1,102 mg (04/26/2019), 1,406 mg (07/04/2019), 1,406 mg (07/18/2019), 1,406 mg (05/09/2019), 1,406 mg (05/23/2019), 1,406 mg (06/22/2019)  for chemotherapy treatment.    02/28/2019 Cancer Staging   Staging form: Exocrine Pancreas, AJCC 8th Edition - Clinical: Stage IB (cT2, cN0, cM0) - Signed by Tish Men, MD on 02/28/2019   04/16/2019 Imaging   CT CAP: IMPRESSION: 1. Margins of pancreatic neoplasm are difficult to identify. Similar appearance of soft tissue infiltration within the retroperitoneum with progressive narrowing of the portal venous confluence by tumor. No specific findings of solid organ metastasis or nodal metastasis within the abdomen or pelvis. 2. Unchanged appearance of multifocal sub solid nodules within the upper lung zones and bilateral lower lung zone tree-in-bud nodularity. As mentioned previously findings are nonspecific and may be inflammatory or infectious in etiology. Metastatic disease is not excluded. 3. Aortic atherosclerosis. 4. Right lobe of thyroid gland nodule. Consider further evaluation with thyroid ultrasound. If patient is clinically hyperthyroid, consider nuclear medicine thyroid uptake and scan.     I have reviewed the past medical history, past surgical history, social history and family history with the patient and they are unchanged from previous note.  ALLERGIES:  is allergic to penicillins.  MEDICATIONS:  Current Outpatient Medications  Medication Sig Dispense Refill  . lidocaine-prilocaine (EMLA) cream Apply to affected area once 30 g 3  . morphine (MSIR) 15 MG tablet Take 1 tablet (15 mg total) by mouth every 6 (six) hours as needed for severe pain. 60 tablet 0  . cyclobenzaprine (FLEXERIL) 10 MG tablet Take 10 mg by mouth 3 (three) times daily as needed for muscle spasms.    .  diphenhydramine-acetaminophen (TYLENOL PM) 25-500 MG TABS tablet Take 2 tablets by mouth at bedtime as needed (sleep).    . LORazepam (ATIVAN) 0.5 MG tablet Take 1 tablet (0.5 mg total) by mouth every 6 (six) hours as needed (Nausea or vomiting). (Patient not taking: Reported on 08/08/2019) 30 tablet 0  . morphine (MS CONTIN) 30 MG 12 hr tablet Take 1 tablet (30 mg total) by mouth every 12 (twelve) hours. 60 tablet 0  . ondansetron (ZOFRAN) 8 MG tablet Take 1 tablet (8 mg total) by mouth 2 (two) times daily as needed (Nausea or vomiting). (Patient not taking: Reported on 08/08/2019) 30 tablet 1  . polyethylene glycol (MIRALAX / GLYCOLAX) 17 g packet Take 17 g by mouth daily.    . prochlorperazine (COMPAZINE) 10 MG tablet Take 1 tablet (10 mg total) by mouth every 6 (six) hours as needed (Nausea or vomiting). (Patient not taking: Reported on 08/08/2019) 30 tablet 1  . senna-docusate (SENNA S) 8.6-50 MG tablet Take 2 tablets by mouth at bedtime.     No current facility-administered medications for this visit.   Facility-Administered Medications Ordered in Other Visits  Medication Dose Route Frequency Provider Last Rate Last Admin  . oxyCODONE-acetaminophen (PERCOCET/ROXICET) 5-325 MG per tablet 2 tablet  2 tablet Oral Once Tish Men, MD      . potassium chloride SA (KLOR-CON) CR tablet 40 mEq  40 mEq Oral Once Tish Men, MD        PHYSICAL EXAMINATION: ECOG  PERFORMANCE STATUS: 2 - Symptomatic, <50% confined to bed  Today's Vitals   08/08/19 1049  BP: 112/82  Pulse: 70  Resp: 16  Temp: 98.5 F (36.9 C)  TempSrc: Temporal  SpO2: 100%  Weight: 96 lb 14.4 oz (44 kg)  Height: 5\' 8"  (1.727 m)   Body mass index is 14.73 kg/m.  Filed Weights   08/08/19 1049  Weight: 96 lb 14.4 oz (44 kg)    GENERAL: alert, no distress and comfortable, thin SKIN: skin color, texture, turgor are normal, no rashes or significant lesions EYES: conjunctiva are pink and non-injected, sclera clear OROPHARYNX: no  exudate, no erythema; lips, buccal mucosa, and tongue normal  NECK: supple, non-tender LUNGS: clear to auscultation with normal breathing effort HEART: regular rate & rhythm and no murmurs and no lower extremity edema ABDOMEN: soft, non-tender, non-distended, normal bowel sounds Musculoskeletal: no cyanosis of digits and no clubbing  PSYCH: alert & oriented x 3, fluent speech  LABORATORY DATA:  I have reviewed the data as listed    Component Value Date/Time   NA 140 08/08/2019 1016   K 3.4 (L) 08/08/2019 1016   CL 101 08/08/2019 1016   CO2 30 08/08/2019 1016   GLUCOSE 96 08/08/2019 1016   BUN 7 08/08/2019 1016   CREATININE 0.75 08/08/2019 1016   CALCIUM 9.1 08/08/2019 1016   PROT 7.1 08/08/2019 1016   ALBUMIN 4.0 08/08/2019 1016   AST 15 08/08/2019 1016   ALT 31 08/08/2019 1016   ALKPHOS 67 08/08/2019 1016   BILITOT 0.3 08/08/2019 1016   GFRNONAA >60 08/08/2019 1016   GFRAA >60 08/08/2019 1016    No results found for: SPEP, UPEP  Lab Results  Component Value Date   WBC 5.9 08/08/2019   NEUTROABS 3.4 08/08/2019   HGB 12.4 (L) 08/08/2019   HCT 38.2 (L) 08/08/2019   MCV 90.3 08/08/2019   PLT 426 (H) 08/08/2019      Chemistry      Component Value Date/Time   NA 140 08/08/2019 1016   K 3.4 (L) 08/08/2019 1016   CL 101 08/08/2019 1016   CO2 30 08/08/2019 1016   BUN 7 08/08/2019 1016   CREATININE 0.75 08/08/2019 1016      Component Value Date/Time   CALCIUM 9.1 08/08/2019 1016   ALKPHOS 67 08/08/2019 1016   AST 15 08/08/2019 1016   ALT 31 08/08/2019 1016   BILITOT 0.3 08/08/2019 1016       RADIOGRAPHIC STUDIES: I have personally reviewed the radiological images as listed below and agreed with the findings in the report. No results found.

## 2019-08-15 ENCOUNTER — Other Ambulatory Visit: Payer: Self-pay

## 2019-08-15 ENCOUNTER — Ambulatory Visit: Payer: Self-pay

## 2019-08-16 NOTE — Progress Notes (Signed)
Pharmacist Chemotherapy Monitoring - Follow Up Assessment    I verify that I have reviewed each item in the below checklist:  . Regimen for the patient is scheduled for the appropriate day and plan matches scheduled date. Marland Kitchen Appropriate non-routine labs are ordered dependent on drug ordered. . If applicable, additional medications reviewed and ordered per protocol based on lifetime cumulative doses and/or treatment regimen.   Plan for follow-up and/or issues identified: No . I-vent associated with next due treatment: No . MD and/or nursing notified: No  Gabriel [name] [facility] 08/16/2019 12:59 PM

## 2019-08-20 ENCOUNTER — Ambulatory Visit (HOSPITAL_COMMUNITY): Payer: Medicaid Other

## 2019-08-22 ENCOUNTER — Other Ambulatory Visit: Payer: Self-pay

## 2019-08-22 ENCOUNTER — Inpatient Hospital Stay: Payer: Medicaid Other

## 2019-08-22 VITALS — BP 101/80 | HR 66 | Temp 97.7°F | Resp 16

## 2019-08-22 DIAGNOSIS — Z5111 Encounter for antineoplastic chemotherapy: Secondary | ICD-10-CM | POA: Diagnosis not present

## 2019-08-22 DIAGNOSIS — C259 Malignant neoplasm of pancreas, unspecified: Secondary | ICD-10-CM

## 2019-08-22 DIAGNOSIS — Z95828 Presence of other vascular implants and grafts: Secondary | ICD-10-CM

## 2019-08-22 LAB — CBC WITH DIFFERENTIAL (CANCER CENTER ONLY)
Abs Immature Granulocytes: 0.01 10*3/uL (ref 0.00–0.07)
Basophils Absolute: 0.1 10*3/uL (ref 0.0–0.1)
Basophils Relative: 1 %
Eosinophils Absolute: 0.3 10*3/uL (ref 0.0–0.5)
Eosinophils Relative: 5 %
HCT: 38.4 % — ABNORMAL LOW (ref 39.0–52.0)
Hemoglobin: 12.5 g/dL — ABNORMAL LOW (ref 13.0–17.0)
Immature Granulocytes: 0 %
Lymphocytes Relative: 18 %
Lymphs Abs: 0.9 10*3/uL (ref 0.7–4.0)
MCH: 29.7 pg (ref 26.0–34.0)
MCHC: 32.6 g/dL (ref 30.0–36.0)
MCV: 91.2 fL (ref 80.0–100.0)
Monocytes Absolute: 0.4 10*3/uL (ref 0.1–1.0)
Monocytes Relative: 8 %
Neutro Abs: 3.4 10*3/uL (ref 1.7–7.7)
Neutrophils Relative %: 68 %
Platelet Count: 178 10*3/uL (ref 150–400)
RBC: 4.21 MIL/uL — ABNORMAL LOW (ref 4.22–5.81)
RDW: 14.4 % (ref 11.5–15.5)
WBC Count: 5.1 10*3/uL (ref 4.0–10.5)
nRBC: 0 % (ref 0.0–0.2)

## 2019-08-22 LAB — CMP (CANCER CENTER ONLY)
ALT: 216 U/L — ABNORMAL HIGH (ref 0–44)
AST: 52 U/L — ABNORMAL HIGH (ref 15–41)
Albumin: 3.9 g/dL (ref 3.5–5.0)
Alkaline Phosphatase: 93 U/L (ref 38–126)
Anion gap: 8 (ref 5–15)
BUN: 7 mg/dL (ref 6–20)
CO2: 28 mmol/L (ref 22–32)
Calcium: 9.4 mg/dL (ref 8.9–10.3)
Chloride: 104 mmol/L (ref 98–111)
Creatinine: 0.74 mg/dL (ref 0.61–1.24)
GFR, Est AFR Am: >60 mL/min (ref >60)
GFR, Estimated: >60 mL/min (ref >60)
Glucose, Bld: 103 mg/dL — ABNORMAL HIGH (ref 70–99)
Potassium: 3.4 mmol/L — ABNORMAL LOW (ref 3.5–5.1)
Sodium: 140 mmol/L (ref 135–145)
Total Bilirubin: 0.5 mg/dL (ref 0.3–1.2)
Total Protein: 7 g/dL (ref 6.5–8.1)

## 2019-08-22 MED ORDER — HEPARIN SOD (PORK) LOCK FLUSH 100 UNIT/ML IV SOLN
500.0000 [IU] | Freq: Once | INTRAVENOUS | Status: AC | PRN
  Administered 2019-08-22: 500 [IU]
  Filled 2019-08-22: qty 5

## 2019-08-22 MED ORDER — PROCHLORPERAZINE MALEATE 10 MG PO TABS
ORAL_TABLET | ORAL | Status: AC
  Filled 2019-08-22: qty 1

## 2019-08-22 MED ORDER — SODIUM CHLORIDE 0.9 % IV SOLN
1000.0000 mg/m2 | Freq: Once | INTRAVENOUS | Status: AC
  Administered 2019-08-22: 1406 mg via INTRAVENOUS
  Filled 2019-08-22: qty 36.98

## 2019-08-22 MED ORDER — SODIUM CHLORIDE 0.9 % IV SOLN
Freq: Once | INTRAVENOUS | Status: AC
  Filled 2019-08-22: qty 250

## 2019-08-22 MED ORDER — SODIUM CHLORIDE 0.9% FLUSH
10.0000 mL | Freq: Once | INTRAVENOUS | Status: AC
  Administered 2019-08-22: 10 mL via INTRAVENOUS
  Filled 2019-08-22: qty 10

## 2019-08-22 MED ORDER — PROCHLORPERAZINE MALEATE 10 MG PO TABS
10.0000 mg | ORAL_TABLET | Freq: Once | ORAL | Status: AC
  Administered 2019-08-22: 10 mg via ORAL

## 2019-08-22 MED ORDER — MORPHINE SULFATE 4 MG/ML IJ SOLN
2.0000 mg | Freq: Once | INTRAMUSCULAR | Status: AC
  Administered 2019-08-22: 2 mg via INTRAVENOUS
  Filled 2019-08-22: qty 1

## 2019-08-22 MED ORDER — SODIUM CHLORIDE 0.9% FLUSH
10.0000 mL | INTRAVENOUS | Status: DC | PRN
  Administered 2019-08-22: 10 mL
  Filled 2019-08-22: qty 10

## 2019-08-22 MED ORDER — MORPHINE SULFATE (PF) 4 MG/ML IV SOLN
INTRAVENOUS | Status: AC
  Filled 2019-08-22: qty 1

## 2019-08-22 MED ORDER — PACLITAXEL PROTEIN-BOUND CHEMO INJECTION 100 MG
100.0000 mg/m2 | Freq: Once | INTRAVENOUS | Status: AC
  Administered 2019-08-22: 150 mg via INTRAVENOUS
  Filled 2019-08-22: qty 30

## 2019-08-22 NOTE — Patient Instructions (Signed)
Cancer Center Discharge Instructions for Patients Receiving Chemotherapy  Today you received the following chemotherapy agents: Abraxane and Gemzar  To help prevent nausea and vomiting after your treatment, we encourage you to take your nausea medication as prescribed.    If you develop nausea and vomiting that is not controlled by your nausea medication, call the clinic.   BELOW ARE SYMPTOMS THAT SHOULD BE REPORTED IMMEDIATELY:  *FEVER GREATER THAN 100.5 F  *CHILLS WITH OR WITHOUT FEVER  NAUSEA AND VOMITING THAT IS NOT CONTROLLED WITH YOUR NAUSEA MEDICATION  *UNUSUAL SHORTNESS OF BREATH  *UNUSUAL BRUISING OR BLEEDING  TENDERNESS IN MOUTH AND THROAT WITH OR WITHOUT PRESENCE OF ULCERS  *URINARY PROBLEMS  *BOWEL PROBLEMS  UNUSUAL RASH Items with * indicate a potential emergency and should be followed up as soon as possible.  Feel free to call the clinic should you have any questions or concerns. The clinic phone number is [phone].  Please show the CHEMO ALERT CARD at check-in to the Emergency Department and triage nurse.   

## 2019-08-22 NOTE — Progress Notes (Signed)
Per Dr. Alen Blew, ok to treat with elevated ALT 216. After initial assessment, patient began c/o lower back pain @ 4/10. Dr. Alen Blew aware. Orders received, repeated, and confirmed. Medicated as documented on MAR. Dr. Alen Blew also advised to inform patient he can increase frequency of morphine (MSIR) to every 4 hours as needed for pain. Patient aware and verbalized understanding.

## 2019-08-23 ENCOUNTER — Other Ambulatory Visit: Payer: Self-pay | Admitting: Hematology

## 2019-08-23 DIAGNOSIS — C259 Malignant neoplasm of pancreas, unspecified: Secondary | ICD-10-CM

## 2019-08-28 ENCOUNTER — Telehealth: Payer: Self-pay | Admitting: Nurse Practitioner

## 2019-08-28 NOTE — Telephone Encounter (Signed)
Called pt to confirm appts on 5/26 - r/s appts per 5/21  sch message - pt aware.

## 2019-08-29 ENCOUNTER — Ambulatory Visit (HOSPITAL_COMMUNITY): Payer: Medicaid Other

## 2019-08-29 ENCOUNTER — Encounter: Payer: Self-pay | Admitting: Nurse Practitioner

## 2019-08-29 ENCOUNTER — Other Ambulatory Visit: Payer: Self-pay

## 2019-08-29 ENCOUNTER — Ambulatory Visit (HOSPITAL_COMMUNITY)
Admission: RE | Admit: 2019-08-29 | Discharge: 2019-08-29 | Disposition: A | Discharge status: Home / Self Care | Payer: Medicaid Other | Source: Ambulatory Visit | Attending: Hematology | Admitting: Hematology

## 2019-08-29 ENCOUNTER — Inpatient Hospital Stay: Payer: Medicaid Other | Attending: Hematology | Admitting: Nurse Practitioner

## 2019-08-29 VITALS — BP 144/93 | HR 64 | Temp 97.8°F | Resp 16 | Wt 99.1 lb

## 2019-08-29 DIAGNOSIS — C251 Malignant neoplasm of body of pancreas: Secondary | ICD-10-CM | POA: Insufficient documentation

## 2019-08-29 DIAGNOSIS — C259 Malignant neoplasm of pancreas, unspecified: Secondary | ICD-10-CM | POA: Diagnosis not present

## 2019-08-29 MED ORDER — HEPARIN SOD (PORK) LOCK FLUSH 100 UNIT/ML IV SOLN
500.0000 [IU] | Freq: Once | INTRAVENOUS | Status: AC
  Administered 2019-08-29: 500 [IU] via INTRAVENOUS

## 2019-08-29 MED ORDER — HEPARIN SOD (PORK) LOCK FLUSH 100 UNIT/ML IV SOLN
INTRAVENOUS | Status: AC
  Filled 2019-08-29: qty 5

## 2019-08-29 MED ORDER — IOHEXOL 300 MG/ML  SOLN
100.0000 mL | Freq: Once | INTRAMUSCULAR | Status: AC | PRN
  Administered 2019-08-29: 100 mL via INTRAVENOUS

## 2019-08-29 MED ORDER — SODIUM CHLORIDE (PF) 0.9 % IJ SOLN
INTRAMUSCULAR | Status: AC
  Filled 2019-08-29: qty 50

## 2019-08-29 NOTE — Progress Notes (Signed)
[place] OFFICE PROGRESS NOTE   Diagnosis: Pancreas cancer  INTERVAL HISTORY:   [name] is a 39 year old man with pancreas cancer previously followed by Dr. Maylon Peppers.  He is currently on active treatment with gemcitabine/Abraxane, most recent cycle 08/22/2019.  He reports overall his abdominal pain is better.  When he has pain it is located at the right side of his abdomen and low back.  He is currently taking MS Contin 30 mg every 12 hours with MSIR 15 mg every 6 hours as needed.  He denies nausea/vomiting.  No mouth sores.  No diarrhea.  No numbness or tingling in the hands or feet.  No fever, cough, shortness of breath.  He reports a good appetite.  He lives in [place] with his fiance.  He has three children ages 51, 32 and 53.  He is currently on disability.  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  He reports smoking approximately 6 cigarettes/day for about 20 years.  No alcohol use.  He has never had a blood transfusion.  His mother had breast cancer.  He thinks his paternal grandmother had cancer but is unsure of the type.  Objective:  Vital signs in last 24 hours:  Blood pressure (!) 144/93, pulse 64, temperature 97.8 F (36.6 C), temperature source Oral, resp. rate 16, weight 99 lb 1.6 oz (45 kg), SpO2 100 %.    HEENT: Multiple fractured teeth.  No thrush or ulcers. GI: No hepatomegaly. Vascular: No leg edema. Neuro: Alert and oriented. Port-A-Cath without erythema.  Lab Results:  Lab Results  Component Value Date   WBC 5.1 08/22/2019   HGB 12.5 (L) 08/22/2019   HCT 38.4 (L) 08/22/2019   MCV 91.2 08/22/2019   PLT 178 08/22/2019   NEUTROABS 3.4 08/22/2019    Imaging:  No results found.  Medications: I have reviewed the patient's current medications.  Assessment/Plan: 1. Pancreas cancer  11/29/2018 CT abdomen/pelvis without contrast-appendix not well visualized, limited evaluation due to lack of enteric contrast and perineal  fat  12/15/2018 CT abdomen/pelvis with contrast-possible 1.5 cm mass within the mid aspect of the pancreatic body with associated atrophy of the upstream pancreatic tail and associated pancreatic ductal dilatation.  Soft tissue fullness around the superior mesenteric artery and celiac axis.  Tree-in-bud nodularity within the peripheral aspect of the lower lobes bilaterally.  12/28/2018 upper EUS-2.4 cm x 1.9 cm mass in the body of the pancreas causing main pancreatic duct obstruction and clearly involving the celiac trunk.  Heterogeneous soft tissue mass in the neck measuring 3.6 cm.  Fine-needle aspiration pancreas with malignant cells consistent with adenocarcinoma.  01/16/2019 Port-A-Cath placement  01/17/2019 PET scan-suspected enlargement of the pancreatic mass with abnormal hypermetabolic activity at the junction of the pancreatic body and tail measuring 3.4 x 2.9 x 2.4 cm, thought to be a high likelihood tumor extension around the vicinity of the celiac trunk and possibly the superior mesenteric artery.  Potential mild left periaortic adenopathy maximum SUV of the indistinct left periaortic lymph nodes approximately 3.3 which is above blood pool levels.  Scattered groundglass density nodules in the lungs favoring the upper lobes with tree-in-bud nodularity in the lung bases favoring the lower lobes.  None of the lesions are hypermetabolic.  Gemcitabine/Abraxane 01/24/2019-08/22/2019  04/16/2019 CT chest/abdomen/pelvis-margins of pancreatic neoplasm difficult to identify.  Similar appearance of soft tissue infiltration within the retroperitoneum with progressive narrowing of the portal venous confluence by tumor.  No specific findings of solid organ metastasis or nodal metastasis  within the abdomen or pelvis.  Unchanged appearance of multifocal subsolid nodules within the upper lung zones and bilateral lower lung zone tree-in-bud nodularity.  Right lobe of thyroid gland nodule. 2. Abdominal pain  secondary to #1 3. Port-A-Cath placement 01/16/2019  Disposition: Mr. Orser is a 39 year old man with locally advanced unresectable pancreas cancer.  He was previously followed by Dr. Maylon Peppers.  Oncology care is being transitioned to Dr. Benay Spice.    Mr. Zunk is currently on active treatment with gemcitabine/Abraxane and seems to be tolerating well.  He had restaging CT scans earlier today, results pending.  Unfortunately due to transportation issues he was unable to complete today's visit.  He will be rescheduled.    Ned Card ANP/GNP-BC   08/29/2019  2:09 PM Mr. Lamontagne left the office prior to my seeing him today.  I reviewed the restaging CT result.  He appears to have overall stable disease, though there is new biliary dilatation with evidence of an obstruction at the common bile duct and enlargement of the pancreas head mass.  There may be a new liver metastasis.  I attempted to contact him by telephone on 08/31/2019 at approximately 3:30 PM.  There was no answer.  We will request he return next week for a chemistry panel.  We will make a GI referral to consider placement of a biliary stent.  Chemotherapy will be placed on hold.  Outpatient follow-up will be scheduled at the Cancer center within the next 1-2 weeks.

## 2019-08-29 NOTE — H&P (View-Only) (Signed)
[facility] OFFICE PROGRESS NOTE   Diagnosis: Pancreas cancer  INTERVAL HISTORY:   [name] is a 39 year old old man with pancreas cancer previously followed by Dr. Maylon [name].  He is currently on active treatment with gemcitabine/Abraxane, most recent cycle 08/22/2019.  He reports overall his abdominal pain is better.  When he has pain it is located at the right side of his abdomen and low back.  He is currently taking MS Contin 30 mg every 12 hours with MSIR 15 mg every 6 hours as needed.  He denies nausea/vomiting.  No mouth sores.  No diarrhea.  No numbness or tingling in the hands or feet.  No fever, cough, shortness of breath.  He reports a good appetite.  He lives in [place] with his fiance.  He has three children ages 64, 32 and 49.  He is currently on disability.  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  He reports smoking approximately 6 cigarettes/day for about 20 years.  No alcohol use.  He has never had a blood transfusion.  His mother had breast cancer.  He thinks his paternal grandmother had cancer but is unsure of the type.  Objective:  Vital signs in last 24 hours:  Blood pressure (!) 144/93, pulse 64, temperature 97.8 F (36.6 C), temperature source Oral, resp. rate 16, weight 99 lb 1.6 oz (45 kg), SpO2 100 %.    HEENT: Multiple fractured teeth.  No thrush or ulcers. GI: No hepatomegaly. Vascular: No leg edema. Neuro: Alert and oriented. Port-A-Cath without erythema.  Lab Results:  Lab Results  Component Value Date   WBC 5.1 08/22/2019   HGB 12.5 (L) 08/22/2019   HCT 38.4 (L) 08/22/2019   MCV 91.2 08/22/2019   PLT 178 08/22/2019   NEUTROABS 3.4 08/22/2019    Imaging:  No results found.  Medications: I have reviewed the patient's current medications.  Assessment/Plan: 1. Pancreas cancer  11/29/2018 CT abdomen/pelvis without contrast-appendix not well visualized, limited evaluation due to lack of enteric contrast and perineal  fat  12/15/2018 CT abdomen/pelvis with contrast-possible 1.5 cm mass within the mid aspect of the pancreatic body with associated atrophy of the upstream pancreatic tail and associated pancreatic ductal dilatation.  Soft tissue fullness around the superior mesenteric artery and celiac axis.  Tree-in-bud nodularity within the peripheral aspect of the lower lobes bilaterally.  12/28/2018 upper EUS-2.4 cm x 1.9 cm mass in the body of the pancreas causing main pancreatic duct obstruction and clearly involving the celiac trunk.  Heterogeneous soft tissue mass in the neck measuring 3.6 cm.  Fine-needle aspiration pancreas with malignant cells consistent with adenocarcinoma.  01/16/2019 Port-A-Cath placement  01/17/2019 PET scan-suspected enlargement of the pancreatic mass with abnormal hypermetabolic activity at the junction of the pancreatic body and tail measuring 3.4 x 2.9 x 2.4 cm, thought to be a high likelihood tumor extension around the vicinity of the celiac trunk and possibly the superior mesenteric artery.  Potential mild left periaortic adenopathy maximum SUV of the indistinct left periaortic lymph nodes approximately 3.3 which is above blood pool levels.  Scattered groundglass density nodules in the lungs favoring the upper lobes with tree-in-bud nodularity in the lung bases favoring the lower lobes.  None of the lesions are hypermetabolic.  Gemcitabine/Abraxane 01/24/2019-08/22/2019  04/16/2019 CT chest/abdomen/pelvis-margins of pancreatic neoplasm difficult to identify.  Similar appearance of soft tissue infiltration within the retroperitoneum with progressive narrowing of the portal venous confluence by tumor.  No specific findings of solid organ metastasis or nodal metastasis  within the abdomen or pelvis.  Unchanged appearance of multifocal subsolid nodules within the upper lung zones and bilateral lower lung zone tree-in-bud nodularity.  Right lobe of thyroid gland nodule. 2. Abdominal pain  secondary to #1 3. Port-A-Cath placement 01/16/2019  Disposition: Gabriel [name] is a 39 year old man with locally advanced unresectable pancreas cancer.  He was previously followed by Dr. Maylon [name].  Oncology care is being transitioned to Dr. Benay [name].    Gabriel [name] is currently on active treatment with gemcitabine/Abraxane and seems to be tolerating well.  He had restaging CT scans earlier today, results pending.  Unfortunately due to transportation issues he was unable to complete today's visit.  He will be rescheduled.    Ned Card ANP/GNP-BC   08/29/2019  2:09 PM Gabriel [name] left the office prior to my seeing him today.  I reviewed the restaging CT result.  He appears to have overall stable disease, though there is new biliary dilatation with evidence of an obstruction at the common bile duct and enlargement of the pancreas head mass.  There may be a new liver metastasis.  I attempted to contact him by telephone on 08/31/2019 at approximately 3:30 PM.  There was no answer.  We will request he return next week for a chemistry panel.  We will make a GI referral to consider placement of a biliary stent.  Chemotherapy will be placed on hold.  Outpatient follow-up will be scheduled at the Cancer center within the next 1-2 weeks.

## 2019-08-30 ENCOUNTER — Ambulatory Visit: Payer: Self-pay | Admitting: Oncology

## 2019-08-30 ENCOUNTER — Other Ambulatory Visit: Payer: Self-pay

## 2019-08-30 ENCOUNTER — Inpatient Hospital Stay: Payer: Medicaid Other

## 2019-09-03 ENCOUNTER — Telehealth: Payer: Self-pay | Admitting: Radiation Oncology

## 2019-09-04 ENCOUNTER — Other Ambulatory Visit: Payer: Self-pay | Admitting: *Deleted

## 2019-09-04 ENCOUNTER — Telehealth: Payer: Self-pay | Admitting: *Deleted

## 2019-09-04 ENCOUNTER — Telehealth: Payer: Self-pay

## 2019-09-04 ENCOUNTER — Other Ambulatory Visit: Payer: Self-pay

## 2019-09-04 DIAGNOSIS — K8689 Other specified diseases of pancreas: Secondary | ICD-10-CM

## 2019-09-04 DIAGNOSIS — K838 Other specified diseases of biliary tract: Secondary | ICD-10-CM

## 2019-09-04 DIAGNOSIS — C259 Malignant neoplasm of pancreas, unspecified: Secondary | ICD-10-CM

## 2019-09-04 NOTE — Telephone Encounter (Signed)
error 

## 2019-09-04 NOTE — Telephone Encounter (Signed)
Called patient regarding need for stat labs today to f/u on bilirubin. May need stent. He has no transportation today, but can still make his appointment on 09/05/19. Informed him that MD has put his chemo on hold until we have resolution of this issue. Dr. Benay Spice has also sent message to Dr. Ardis Hughs w/GI and they may be calling him soon as well. He denies any fever or jaundice. Having some mild pain "in my side". Per Dr. Benay Spice: OK for lab tomorrow. Reschedule OV for next week.

## 2019-09-04 NOTE — Telephone Encounter (Signed)
-----   Message from Milus Banister, MD sent at 09/04/2019  6:04 AM EDT ----- Leroy Sea, we will reach out to him today.  Sherlyn Ebbert, He needs ERCP, I have availability next Thursday (10th).  Can you contact him to arrange? Thanks  DJ ----- Message ----- From: Ladell Pier, MD Sent: 08/31/2019   3:39 PM EDT To: Milus Banister, MD  He has been followed by Dr. Maylon Peppers for treatment of unresectable pancreas cancer.  A CT 08/29/2019 reveals new bile duct dilatation with a cut off at the common bile duct, enlargement of the pancreas head, and a possible new liver lesion  He did not have a chemistry panel this week.  We will request to return next week for a chemistry panel  Can you see him to consider the need for stent placement  Thanks,  Leroy Sea

## 2019-09-04 NOTE — Telephone Encounter (Signed)
-----   Message from Ladell Pier, MD sent at 08/31/2019  3:41 PM EDT ----- He needs a chemistry panel on 09/04/2019, I message Dr. Ardis Hughs to see him for a bile duct stent  Please place chemotherapy on hold and reschedule an office visit with [place] for the week of 09/10/2019  I attempted to contact Mr. Walls on 08/31/2019 and he did not answer

## 2019-09-04 NOTE — Telephone Encounter (Signed)
ERCP scheduled with Dr Ardis Hughs on 09/13/19 at 930 am at [facility] test on 09/10/19 at 1110 am.  The pt has been advised of the information and verbalized understanding.

## 2019-09-05 ENCOUNTER — Inpatient Hospital Stay: Payer: Medicaid Other | Attending: Hematology

## 2019-09-05 ENCOUNTER — Inpatient Hospital Stay: Payer: Medicaid Other

## 2019-09-05 ENCOUNTER — Telehealth: Payer: Self-pay | Admitting: *Deleted

## 2019-09-05 ENCOUNTER — Ambulatory Visit: Payer: Self-pay | Admitting: Oncology

## 2019-09-05 ENCOUNTER — Other Ambulatory Visit: Payer: Self-pay

## 2019-09-05 DIAGNOSIS — C251 Malignant neoplasm of body of pancreas: Secondary | ICD-10-CM | POA: Diagnosis present

## 2019-09-05 DIAGNOSIS — Z95828 Presence of other vascular implants and grafts: Secondary | ICD-10-CM

## 2019-09-05 DIAGNOSIS — C259 Malignant neoplasm of pancreas, unspecified: Secondary | ICD-10-CM

## 2019-09-05 LAB — CMP (CANCER CENTER ONLY)
ALT: 407 U/L (ref 0–44)
AST: 246 U/L (ref 15–41)
Albumin: 3.8 g/dL (ref 3.5–5.0)
Alkaline Phosphatase: 169 U/L — ABNORMAL HIGH (ref 38–126)
Anion gap: 8 (ref 5–15)
BUN: 8 mg/dL (ref 6–20)
CO2: 29 mmol/L (ref 22–32)
Calcium: 9.4 mg/dL (ref 8.9–10.3)
Chloride: 104 mmol/L (ref 98–111)
Creatinine: 0.78 mg/dL (ref 0.61–1.24)
GFR, Est AFR Am: >60 mL/min (ref >60)
GFR, Estimated: >60 mL/min (ref >60)
Glucose, Bld: 154 mg/dL — ABNORMAL HIGH (ref 70–99)
Potassium: 3.2 mmol/L — ABNORMAL LOW (ref 3.5–5.1)
Sodium: 141 mmol/L (ref 135–145)
Total Bilirubin: 0.6 mg/dL (ref 0.3–1.2)
Total Protein: 6.7 g/dL (ref 6.5–8.1)

## 2019-09-05 MED ORDER — HEPARIN SOD (PORK) LOCK FLUSH 100 UNIT/ML IV SOLN
500.0000 [IU] | Freq: Once | INTRAVENOUS | Status: AC
  Administered 2019-09-05: 500 [IU] via INTRAVENOUS
  Filled 2019-09-05: qty 5

## 2019-09-05 MED ORDER — SODIUM CHLORIDE 0.9% FLUSH
10.0000 mL | INTRAVENOUS | Status: DC | PRN
  Administered 2019-09-05: 10 mL via INTRAVENOUS
  Filled 2019-09-05: qty 10

## 2019-09-05 MED ORDER — POTASSIUM CHLORIDE CRYS ER 20 MEQ PO TBCR: 20.0000 meq | EXTENDED_RELEASE_TABLET | Freq: Every day | ORAL | 1 refills | Status: AC

## 2019-09-05 NOTE — Patient Instructions (Signed)

## 2019-09-05 NOTE — Telephone Encounter (Signed)
CRITICAL VALUE STICKER  CRITICAL VALUE: AST=246 and ALT=407  RECEIVER (on-site recipient of call): Manuela Schwartz, [place] NOTIFIED: 09/05/19 @ 1149  MESSENGER (representative from lab): Suanne Marker  MD NOTIFIED: Ned Card, NP  TIME OF NOTIFICATION: [id number]  RESPONSE: Notify GI. Staff message sent to Dr. Ardis Hughs and called [facility] and spoke w/Sophia at [facility] and she will alert MD.

## 2019-09-05 NOTE — Telephone Encounter (Signed)
Unable to reach patient on phone, spoke w/his significant other, Robin. Provided results and that he may be called soon from [place] and suggested he have phone w/him to answer. Also K+ is low and script sent in for this. Have him call with any severe pain, fever or N/V.

## 2019-09-07 ENCOUNTER — Telehealth: Payer: Self-pay

## 2019-09-07 DIAGNOSIS — C259 Malignant neoplasm of pancreas, unspecified: Secondary | ICD-10-CM

## 2019-09-07 NOTE — Telephone Encounter (Signed)
FYI Dr Jacobs  

## 2019-09-07 NOTE — Telephone Encounter (Signed)
-----  Message from Milus Banister, MD sent at 09/07/2019  7:40 AM EDT ----- It is new, I think it is likely a site of metastasis but very hard to know for certain.  I will see about getting an MRI expedited.  Arti Trang, Can you contact him and try to get an MRI of liver to evaluate the ? Metastasis from known pancreatic adeno. Would be great if it is done before Wednesday so that we can get a reading and make a decision about placing fiducials at time of ERCP.  Thanks  ----- Message ----- From: Ladell Pier, MD Sent: 09/06/2019   7:49 PM EDT To: Milus Banister, MD  I have not seen this patient yet so do not have good handle on disease status. I think there was a question of a liver met on recent CT, if confirmed he will not be a candidate for sbrt.  I am out of town and will try to see him next week. Can you look at the CT to see how suspicious the liver lesion is? Doubt we can get an MRI prior to the ERCP thanks ----- Message ----- From: Milus Banister, MD Sent: 09/06/2019   7:17 AM EDT To: Hayden Pedro, PA-C, #  It would require a different scope and about another 20 minutes to half hour of time.  I am sure I could get this done if necessary.   Brad, What do you think about SBRT?  I can place fiducials next week at the same time as his ERCP if SBRT is a go.  Thanks ----- Message ----- From: Hayden Pedro, PA-C Sent: 09/05/2019   2:09 PM EDT To: Milus Banister, MD, Ladell Pier, MD  Good afternoon! Dr. Maylon Peppers had asked [place] about options of SBRT prior to his departure. The patient delayed having his PET imaging for several months, and it appears he had a CT last week. I also see notes about needing ERCP. Dr. Lisbeth Renshaw still thinks he would be a candidate for SBRT. If he's already having ERCP, I'm not sure how feasible fiducial marker placement would be but if possible, and if Dr. Benay Spice agrees, fiducials would help [place] if he needs SBRT.   Thoughts?   [employer]

## 2019-09-07 NOTE — Telephone Encounter (Signed)
This patient has Medicaid.  The auth process goes through [employer].  I have started a case and it is in review.  [employer] takes sometimes 48 hours to up to a week to come back.  I have submitted all records.

## 2019-09-07 NOTE — Telephone Encounter (Signed)
Gabriel [name] this pt needs to have MRI liver stat (prior to 6/10) they will not schedule until I get the prior auth.  Please advise

## 2019-09-07 NOTE — Telephone Encounter (Signed)
Thank you for working on it.  Obviously this is a last-minute thing and if MRI cannot happen in time then we will just have to figure things out after the procedure next week.

## 2019-09-10 ENCOUNTER — Ambulatory Visit (HOSPITAL_COMMUNITY): Payer: Medicaid Other

## 2019-09-10 ENCOUNTER — Other Ambulatory Visit (HOSPITAL_COMMUNITY): Payer: Medicaid Other

## 2019-09-10 NOTE — Telephone Encounter (Signed)
MRI changed to 09/20/19 at 12 noon at [facility] radiology.NPO 4 hours

## 2019-09-10 NOTE — Telephone Encounter (Signed)
Tired to reach the pt on several occasions thru out the day with out response.  Several messages left.  Called the significant other and she advised to call her back in 5 min and she would be with the pt.  I called back after 5 min and received voice mail.  Left another message for the pt to call back if possible in the  next 10 min. (4:50 pm)

## 2019-09-10 NOTE — Telephone Encounter (Signed)
[place], Amy Merian Capron, Marthenia Rolling, RN  Good morning Zela Sobieski,  Just received fax auth for this pt.  Please schedule at [facility]. That is site that the auth is good for.  Auth# [id number] good 09/07/19-03/05/20  Thank you!  Amy

## 2019-09-10 NOTE — Telephone Encounter (Signed)
[facility] MRI 09/10/19 at 7 pm arrive at 630 pm NPO 4 hours

## 2019-09-10 NOTE — Telephone Encounter (Signed)
I spoke with the pt and he is not able to make the appt tonight for the MRI- he also missed the COVID test appt and will go tomorrow morning and have that done.  I will reschedule the appt and call pt with new date and time.

## 2019-09-11 ENCOUNTER — Other Ambulatory Visit: Payer: Self-pay | Admitting: Nurse Practitioner

## 2019-09-11 ENCOUNTER — Other Ambulatory Visit (HOSPITAL_COMMUNITY)
Admission: RE | Admit: 2019-09-11 | Discharge: 2019-09-11 | Disposition: A | Discharge status: Home / Self Care | Payer: Medicaid Other | Source: Ambulatory Visit | Attending: Gastroenterology | Admitting: Gastroenterology

## 2019-09-11 DIAGNOSIS — C259 Malignant neoplasm of pancreas, unspecified: Secondary | ICD-10-CM

## 2019-09-11 DIAGNOSIS — Z20822 Contact with and (suspected) exposure to covid-19: Secondary | ICD-10-CM | POA: Insufficient documentation

## 2019-09-11 DIAGNOSIS — Z01812 Encounter for preprocedural laboratory examination: Secondary | ICD-10-CM | POA: Diagnosis present

## 2019-09-11 LAB — SARS CORONAVIRUS 2 (TAT 6-24 HRS): SARS Coronavirus 2: NEGATIVE

## 2019-09-11 NOTE — Telephone Encounter (Signed)
The pt has been advised and will get COVID today and keep appt for MRI on 6/17. FYI Dr Ardis Hughs I tried to get the MRI for last night but the pt was unable to go.

## 2019-09-12 ENCOUNTER — Inpatient Hospital Stay: Payer: Medicaid Other

## 2019-09-12 ENCOUNTER — Inpatient Hospital Stay: Payer: Medicaid Other | Admitting: Nurse Practitioner

## 2019-09-12 NOTE — Anesthesia Preprocedure Evaluation (Addendum)
Anesthesia Evaluation  Patient identified by MRN, date of birth, ID band Patient awake    Reviewed: Allergy & Precautions, H&P , NPO status , Patient's Chart, lab work & pertinent test results  Airway Mallampati: II  TM Distance: >3 FB Neck ROM: Full    Dental no notable dental hx. (+) Teeth Intact, Dental Advisory Given   Pulmonary Current Smoker and Patient abstained from smoking.,    Pulmonary exam normal breath sounds clear to auscultation       Cardiovascular negative cardio ROS Normal cardiovascular exam Rhythm:Regular Rate:Normal     Neuro/Psych    GI/Hepatic Pancreatic mass   Endo/Other    Renal/GU      Musculoskeletal   Abdominal   Peds  Hematology   Anesthesia Other Findings   Reproductive/Obstetrics                            Anesthesia Physical  Anesthesia Plan  ASA: III  Anesthesia Plan: General   Post-op Pain Management:    Induction: Intravenous  PONV Risk Score and Plan: 2 and Treatment may vary due to age or medical condition, Midazolam, Ondansetron and Dexamethasone  Airway Management Planned: Oral ETT  Additional Equipment: None  Intra-op Plan:   Post-operative Plan: Extubation in OR  Informed Consent: I have reviewed the patients History and Physical, chart, labs and discussed the procedure including the risks, benefits and alternatives for the proposed anesthesia with the patient or authorized representative who has indicated his/her understanding and acceptance.     Dental advisory given  Plan Discussed with: CRNA  Anesthesia Plan Comments:       Anesthesia Quick Evaluation

## 2019-09-13 ENCOUNTER — Ambulatory Visit (HOSPITAL_COMMUNITY)
Admission: RE | Admit: 2019-09-13 | Discharge: 2019-09-13 | Disposition: A | Discharge status: Home / Self Care | Payer: Medicaid Other | Attending: Oncology | Admitting: Oncology

## 2019-09-13 ENCOUNTER — Other Ambulatory Visit: Payer: Self-pay

## 2019-09-13 ENCOUNTER — Encounter: Payer: Self-pay | Admitting: Pharmacy Technician

## 2019-09-13 ENCOUNTER — Ambulatory Visit (HOSPITAL_COMMUNITY): Payer: Medicaid Other | Admitting: Anesthesiology

## 2019-09-13 ENCOUNTER — Ambulatory Visit (HOSPITAL_COMMUNITY): Payer: Medicaid Other

## 2019-09-13 ENCOUNTER — Encounter (HOSPITAL_COMMUNITY): Payer: Self-pay | Admitting: Gastroenterology

## 2019-09-13 ENCOUNTER — Encounter (HOSPITAL_COMMUNITY)
Admission: RE | Disposition: A | Discharge status: Home / Self Care | Payer: Self-pay | Source: Home / Self Care | Attending: Gastroenterology

## 2019-09-13 DIAGNOSIS — F17210 Nicotine dependence, cigarettes, uncomplicated: Secondary | ICD-10-CM | POA: Diagnosis not present

## 2019-09-13 DIAGNOSIS — C251 Malignant neoplasm of body of pancreas: Secondary | ICD-10-CM | POA: Insufficient documentation

## 2019-09-13 DIAGNOSIS — K838 Other specified diseases of biliary tract: Secondary | ICD-10-CM

## 2019-09-13 DIAGNOSIS — K831 Obstruction of bile duct: Secondary | ICD-10-CM | POA: Diagnosis not present

## 2019-09-13 DIAGNOSIS — K8689 Other specified diseases of pancreas: Secondary | ICD-10-CM

## 2019-09-13 HISTORY — PX: SPHINCTEROTOMY: SHX5544

## 2019-09-13 HISTORY — PX: BILIARY STENT PLACEMENT: SHX5538

## 2019-09-13 HISTORY — PX: ENDOSCOPIC RETROGRADE CHOLANGIOPANCREATOGRAPHY (ERCP) WITH PROPOFOL: SHX5810

## 2019-09-13 LAB — HEPATIC FUNCTION PANEL
ALT: 439 U/L — ABNORMAL HIGH (ref 0–44)
AST: 145 U/L — ABNORMAL HIGH (ref 15–41)
Albumin: 3.8 g/dL (ref 3.5–5.0)
Alkaline Phosphatase: 172 U/L — ABNORMAL HIGH (ref 38–126)
Bilirubin, Direct: 1.8 mg/dL — ABNORMAL HIGH (ref 0.0–0.2)
Indirect Bilirubin: 1.2 mg/dL — ABNORMAL HIGH (ref 0.3–0.9)
Total Bilirubin: 3 mg/dL — ABNORMAL HIGH (ref 0.3–1.2)
Total Protein: 6.8 g/dL (ref 6.5–8.1)

## 2019-09-13 SURGERY — ENDOSCOPIC RETROGRADE CHOLANGIOPANCREATOGRAPHY (ERCP) WITH PROPOFOL
Anesthesia: General

## 2019-09-13 MED ORDER — ONDANSETRON HCL 4 MG/2ML IJ SOLN
INTRAMUSCULAR | Status: DC | PRN
  Administered 2019-09-13: 4 mg via INTRAVENOUS

## 2019-09-13 MED ORDER — INDOMETHACIN 50 MG RE SUPP
RECTAL | Status: AC
  Filled 2019-09-13: qty 2

## 2019-09-13 MED ORDER — LACTATED RINGERS IV SOLN: INTRAVENOUS | Status: DC

## 2019-09-13 MED ORDER — MIDAZOLAM HCL 2 MG/2ML IJ SOLN
INTRAMUSCULAR | Status: AC
  Filled 2019-09-13: qty 2

## 2019-09-13 MED ORDER — MIDAZOLAM HCL 5 MG/5ML IJ SOLN
INTRAMUSCULAR | Status: DC | PRN
  Administered 2019-09-13: 2 mg via INTRAVENOUS

## 2019-09-13 MED ORDER — INDOMETHACIN 50 MG RE SUPP
RECTAL | Status: DC | PRN
  Administered 2019-09-13: 100 mg via RECTAL

## 2019-09-13 MED ORDER — GLUCAGON HCL RDNA (DIAGNOSTIC) 1 MG IJ SOLR
INTRAMUSCULAR | Status: AC
  Filled 2019-09-13: qty 1

## 2019-09-13 MED ORDER — FENTANYL CITRATE (PF) 100 MCG/2ML IJ SOLN
INTRAMUSCULAR | Status: DC | PRN
  Administered 2019-09-13 (×4): 50 ug via INTRAVENOUS

## 2019-09-13 MED ORDER — CIPROFLOXACIN IN D5W 400 MG/200ML IV SOLN
INTRAVENOUS | Status: AC
  Filled 2019-09-13: qty 200

## 2019-09-13 MED ORDER — DEXAMETHASONE SODIUM PHOSPHATE 10 MG/ML IJ SOLN
INTRAMUSCULAR | Status: DC | PRN
  Administered 2019-09-13: 10 mg via INTRAVENOUS

## 2019-09-13 MED ORDER — ESMOLOL HCL 100 MG/10ML IV SOLN
INTRAVENOUS | Status: DC | PRN
  Administered 2019-09-13: 30 mg via INTRAVENOUS

## 2019-09-13 MED ORDER — SODIUM CHLORIDE 0.9 % IV SOLN
INTRAVENOUS | Status: DC | PRN
  Administered 2019-09-13: 30 mL

## 2019-09-13 MED ORDER — PROPOFOL 10 MG/ML IV BOLUS
INTRAVENOUS | Status: DC | PRN
  Administered 2019-09-13: 120 mg via INTRAVENOUS
  Administered 2019-09-13: 20 mg via INTRAVENOUS

## 2019-09-13 MED ORDER — HEPARIN SOD (PORK) LOCK FLUSH 100 UNIT/ML IV SOLN
500.0000 [IU] | INTRAVENOUS | Status: AC | PRN
  Administered 2019-09-13: 500 [IU]

## 2019-09-13 MED ORDER — SODIUM CHLORIDE 0.9 % IV SOLN: INTRAVENOUS | Status: DC

## 2019-09-13 MED ORDER — FENTANYL CITRATE (PF) 100 MCG/2ML IJ SOLN
INTRAMUSCULAR | Status: AC
  Filled 2019-09-13: qty 2

## 2019-09-13 MED ORDER — ROCURONIUM BROMIDE 10 MG/ML (PF) SYRINGE
PREFILLED_SYRINGE | INTRAVENOUS | Status: DC | PRN
  Administered 2019-09-13: 10 mg via INTRAVENOUS
  Administered 2019-09-13: 30 mg via INTRAVENOUS
  Administered 2019-09-13: 10 mg via INTRAVENOUS

## 2019-09-13 MED ORDER — PROPOFOL 500 MG/50ML IV EMUL
INTRAVENOUS | Status: AC
  Filled 2019-09-13: qty 50

## 2019-09-13 MED ORDER — SUGAMMADEX SODIUM 200 MG/2ML IV SOLN
INTRAVENOUS | Status: DC | PRN
  Administered 2019-09-13: 200 mg via INTRAVENOUS

## 2019-09-13 NOTE — Discharge Instructions (Signed)
YOU HAD AN ENDOSCOPIC PROCEDURE TODAY: Refer to the procedure report and other information in the discharge instructions given to you for any specific questions about what was found during the examination. If this information does not answer your questions, please call [facility] office at [phone] to clarify.   YOU SHOULD EXPECT: Some feelings of bloating in the abdomen. Passage of more gas than usual. Walking can help get rid of the air that was put into your GI tract during the procedure and reduce the bloating. If you had a lower endoscopy (such as a colonoscopy or flexible sigmoidoscopy) you may notice spotting of blood in your stool or on the toilet paper. Some abdominal soreness may be present for a day or two, also.  DIET: Your first meal following the procedure should be a light meal and then it is ok to progress to your normal diet. A half-sandwich or bowl of soup is an example of a good first meal. Heavy or fried foods are harder to digest and may make you feel nauseous or bloated. Drink plenty of fluids but you should avoid alcoholic beverages for 24 hours. If you had a esophageal dilation, please see attached instructions for diet.    ACTIVITY: Your care partner should take you home directly after the procedure. You should plan to take it easy, moving slowly for the rest of the day. You can resume normal activity the day after the procedure however YOU SHOULD NOT DRIVE, use power tools, machinery or perform tasks that involve climbing or major physical exertion for 24 hours (because of the sedation medicines used during the test).   SYMPTOMS TO REPORT IMMEDIATELY: A gastroenterologist can be reached at any hour. Please call [phone]  for any of the following symptoms:   Following upper endoscopy (EGD, EUS, ERCP, esophageal dilation) Vomiting of blood or coffee ground material  New, significant abdominal pain  New, significant chest pain or pain under the shoulder blades  Painful or  persistently difficult swallowing  New shortness of breath  Black, tarry-looking or red, bloody stools  FOLLOW UP:  If any biopsies were taken you will be contacted by phone or by letter within the next 1-3 weeks. Call [phone]  if you have not heard about the biopsies in 3 weeks.  Please also call with any specific questions about appointments or follow up tests.  

## 2019-09-13 NOTE — Progress Notes (Signed)
Patient has been approved for drug assistance by [employer] for Abraxane. The enrollment periodcontinues thru 04/04/20 based on self pay. First DOS covered is continuation.

## 2019-09-13 NOTE — Transfer of Care (Signed)
Immediate Anesthesia Transfer of Care Note  Patient: Gabriel [name]  Procedure(s) Performed: Procedure(s): ENDOSCOPIC RETROGRADE CHOLANGIOPANCREATOGRAPHY (ERCP) WITH PROPOFOL (N/A)  Patient Location: PACU  Anesthesia Type:General  Level of Consciousness: Alert, Awake, Oriented  Airway & Oxygen Therapy: Patient Spontanous Breathing  Post-op Assessment: Report given to RN  Post vital signs: Reviewed and stable  Last Vitals:  Vitals:   09/13/19 0854  BP: (!) 147/85  Pulse: (!) 56  Resp: 14  Temp: 36.6 C  SpO2: 19%    Complications: No apparent anesthesia complications

## 2019-09-13 NOTE — Op Note (Signed)
[facility] Patient Name: Gabriel [name] Procedure Date: 09/13/2019 MRN: [id number] Attending MD: Milus Banister , MD Date of Birth: 1981/03/05 CSN: [id number] Age: 39 Admit Type: Outpatient Procedure:                ERCP Indications:              inoperable pancreatic body adenocarcinoma, recent                            rise in LFTs (Tbili this AM was 3), and new                            dilation of intra/extrahepatic bile ducts on CT Providers:                Milus Banister, MD, Cleda Daub, RN, Theodora Blow, Technician Referring MD:             Julieanne Manson, MD Medicines:                General Anesthesia, Indomethacin 433 mg PR Complications:            No immediate complications. Estimated blood loss:                            None Estimated Blood Loss:     Estimated blood loss: none. Procedure:                Pre-Anesthesia Assessment:                           - Prior to the procedure, a History and Physical                            was performed, and patient medications and                            allergies were reviewed. The patient's tolerance of                            previous anesthesia was also reviewed. The risks                            and benefits of the procedure and the sedation                            options and risks were discussed with the patient.                            All questions were answered, and informed consent                            was obtained. Prior Anticoagulants: The patient has  taken no previous anticoagulant or antiplatelet                            agents. ASA Grade Assessment: II - A patient with                            mild systemic disease. After reviewing the risks                            and benefits, the patient was deemed in                            satisfactory condition to undergo the procedure.                            After obtaining informed consent, the scope was                            passed under direct vision. Throughout the                            procedure, the patient's blood pressure, pulse, and                            oxygen saturations were monitored continuously. The                            TJF-Q180V ([id number]) Olympus Duodenoscope was                            introduced through the mouth, and used to inject                            contrast into and used to cannulate the bile duct.                            The ERCP was accomplished without difficulty. The                            patient tolerated the procedure well. Scope In: Scope Out: Findings:      The scout film was normal. The duodenoscope was advanced to the region       of a normal appearing major papilla without detailed examination of the       UGI tract. A Jagtome 21 over a .025 wire was used to cannulate the bile       duct. A .035 wire was temporarily placed into the main pancreatid duct       to facilitate biliary cannulation and contrast was briefly injected in       the main pancreatic duct to help define the ductal anatomy.       Cholangiogram revealed a tight 1cm long mid duct sticture. The distal       most 2-3cm of the CBD was normal, non-dilated. The bile ducts proximal       to the stricture (  CBD, CHD, intrahepatics) were all dilated, CHD 1.3cm.       The cystic duct and gallbladder did not opacify. To ensure biliary       access and also to help allow for metal stent placement, I performed a       short biliary sphincterotomy. I then attempted to place an 8cm long 25mm       diameter uncovered metal stent across the bile duct stricture however it       appeared quite a bit too long and so instead I placed a 6cm long 83mm       diameter UNcovered metal stent in adequate position, eccentrically       bridging the bile duct stricture. There was immediate delivery of old,       dark bile through the  stent into the duodenum following stent placement.       No purulence noted. 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 This was treated                            with a short biliary sphincterotomy and placement                            of a 6cm long 44mm diameter UNcovered metal biliary                            stent in adequate position. Moderate Sedation:      Not Applicable - Patient had care per Anesthesia. Recommendation:           - Discharge patient to home (ambulatory).                           - Follow LFTs. Procedure Code(s):        --- Professional ---                           [phone], Endoscopic retrograde                            cholangiopancreatography (ERCP); with placement of                            endoscopic stent into biliary or pancreatic duct,                            including pre- and post-dilation and guide wire                            passage, when performed, including sphincterotomy,                            when performed, each stent Diagnosis Code(s):        --- Professional ---  K83.1, Obstruction of bile duct CPT copyright 2019 American Medical Association. All rights reserved. The codes documented in this report are preliminary and upon coder review may  be revised to meet current compliance requirements. Milus Banister, MD 09/13/2019 10:49:36 AM This report has been signed electronically. Number of Addenda: 0

## 2019-09-13 NOTE — Anesthesia Procedure Notes (Signed)
Procedure Name: Intubation Date/Time: 09/13/2019 9:42 AM Performed by: Gerald Leitz, CRNA Pre-anesthesia Checklist: Patient identified, Patient being monitored, Timeout performed, Emergency Drugs available and Suction available Patient Re-evaluated:Patient Re-evaluated prior to induction Oxygen Delivery Method: Circle system utilized Preoxygenation: Pre-oxygenation with 100% oxygen Induction Type: IV induction Ventilation: Mask ventilation without difficulty Laryngoscope Size: Mac and 3 Grade View: Grade I Tube type: Oral Tube size: 7.5 mm Number of attempts: 1 Placement Confirmation: ETT inserted through vocal cords under direct vision,  positive ETCO2 and breath sounds checked- equal and bilateral Secured at: 22 cm Tube secured with: Tape Dental Injury: Teeth and Oropharynx as per pre-operative assessment

## 2019-09-13 NOTE — Interval H&P Note (Signed)
History and Physical Interval Note:  09/13/2019 8:51 AM  Gabriel [name]  has presented today for surgery, with the diagnosis of pancreatic mass- bile duct dilation.  The various methods of treatment have been discussed with the patient and family. After consideration of risks, benefits and other options for treatment, the patient has consented to  Procedure(s): ENDOSCOPIC RETROGRADE CHOLANGIOPANCREATOGRAPHY (ERCP) WITH PROPOFOL (N/A) as a surgical intervention.  The patient's history has been reviewed, patient examined, no change in status, stable for surgery.  I have reviewed the patient's chart and labs.  Questions were answered to the patient's satisfaction.     Milus Banister

## 2019-09-13 NOTE — Anesthesia Postprocedure Evaluation (Signed)
Anesthesia Post Note  Patient: Gabriel [name]  Procedure(s) Performed: ENDOSCOPIC RETROGRADE CHOLANGIOPANCREATOGRAPHY (ERCP) WITH PROPOFOL (N/A ) SPHINCTEROTOMY BILIARY STENT PLACEMENT (N/A )     Patient location during evaluation: PACU Anesthesia Type: General Level of consciousness: sedated and patient cooperative Pain management: pain level controlled Vital Signs Assessment: post-procedure vital signs reviewed and stable Respiratory status: spontaneous breathing Cardiovascular status: stable Anesthetic complications: no   No complications documented.  Last Vitals:  Vitals:   09/13/19 1100 09/13/19 1110  BP: 129/83 132/86  Pulse: 75 (!) 57  Resp: 11 (!) 0  Temp:    SpO2: 100% 100%    Last Pain:  Vitals:   09/13/19 1053  TempSrc: Oral  PainSc:                  Nolon Nations

## 2019-09-14 ENCOUNTER — Encounter (HOSPITAL_COMMUNITY): Payer: Self-pay | Admitting: Gastroenterology

## 2019-09-15 ENCOUNTER — Other Ambulatory Visit: Payer: Self-pay | Admitting: Internal Medicine

## 2019-09-15 DIAGNOSIS — C259 Malignant neoplasm of pancreas, unspecified: Secondary | ICD-10-CM

## 2019-09-15 DIAGNOSIS — G893 Neoplasm related pain (acute) (chronic): Secondary | ICD-10-CM

## 2019-09-15 MED ORDER — MORPHINE SULFATE 15 MG PO TABS: 15.0000 mg | ORAL_TABLET | Freq: Four times a day (QID) | ORAL | 0 refills | Status: DC | PRN

## 2019-09-17 ENCOUNTER — Other Ambulatory Visit: Payer: Self-pay | Admitting: Oncology

## 2019-09-17 ENCOUNTER — Telehealth: Payer: Self-pay | Admitting: *Deleted

## 2019-09-17 DIAGNOSIS — G893 Neoplasm related pain (acute) (chronic): Secondary | ICD-10-CM

## 2019-09-17 DIAGNOSIS — C259 Malignant neoplasm of pancreas, unspecified: Secondary | ICD-10-CM

## 2019-09-17 MED ORDER — MORPHINE SULFATE 15 MG PO TABS: 15.0000 mg | ORAL_TABLET | ORAL | 0 refills | Status: DC | PRN

## 2019-09-17 MED ORDER — MORPHINE SULFATE 15 MG PO TABS: 15.0000 mg | ORAL_TABLET | Freq: Four times a day (QID) | ORAL | 0 refills | Status: DC | PRN

## 2019-09-17 NOTE — Telephone Encounter (Addendum)
Left VM for patient to call back and ask for Dr. Gearldine Shown nurse to review pain med request and next appointment. Dr. Benay Spice sent in script for [facility]. Scheduled f/u on 6/21 at 1315/1345 for lab/OV. Patient notified and reports he has transportation.

## 2019-09-17 NOTE — Telephone Encounter (Signed)
Called to request refills on the following meds: MSIR 15 mg--last filled 08/08/19 for #60 by Dr. Maylon Peppers Oxycodone-apap--last filled 04/16/19 # Dr. Maylon Peppers

## 2019-09-18 ENCOUNTER — Telehealth: Payer: Self-pay | Admitting: *Deleted

## 2019-09-18 NOTE — Telephone Encounter (Signed)
Left VM for patient that his 6/21 appointment has been rescheduled for 09/21/19 at 1:15 pm. Requested a return call to confirm.

## 2019-09-19 ENCOUNTER — Telehealth: Payer: Self-pay | Admitting: *Deleted

## 2019-09-19 ENCOUNTER — Other Ambulatory Visit: Payer: Self-pay | Admitting: Nurse Practitioner

## 2019-09-19 DIAGNOSIS — C259 Malignant neoplasm of pancreas, unspecified: Secondary | ICD-10-CM

## 2019-09-19 MED ORDER — MORPHINE SULFATE ER 30 MG PO TBCR: 30.0000 mg | EXTENDED_RELEASE_TABLET | Freq: Two times a day (BID) | ORAL | 0 refills | Status: DC

## 2019-09-19 NOTE — Telephone Encounter (Signed)
Called to request the refill on his MS Contin 30 mg q12 hours. Has been out of medication for several days and is in pain, despite the MSIR. Informed him it will be refilled today for him. Provided appointment information for 09/21/19. He will call if not able to get a ride.

## 2019-09-19 NOTE — Telephone Encounter (Signed)
Opened in error

## 2019-09-20 ENCOUNTER — Ambulatory Visit (HOSPITAL_COMMUNITY)
Admission: RE | Admit: 2019-09-20 | Discharge: 2019-09-20 | Disposition: A | Discharge status: Home / Self Care | Payer: Medicaid Other | Source: Ambulatory Visit | Attending: Gastroenterology | Admitting: Gastroenterology

## 2019-09-20 ENCOUNTER — Other Ambulatory Visit: Payer: Self-pay

## 2019-09-20 DIAGNOSIS — C259 Malignant neoplasm of pancreas, unspecified: Secondary | ICD-10-CM | POA: Insufficient documentation

## 2019-09-20 MED ORDER — GADOBUTROL 1 MMOL/ML IV SOLN
5.0000 mL | Freq: Once | INTRAVENOUS | Status: AC | PRN
  Administered 2019-09-20: 5 mL via INTRAVENOUS

## 2019-09-21 ENCOUNTER — Inpatient Hospital Stay: Payer: Medicaid Other | Admitting: Nurse Practitioner

## 2019-09-21 ENCOUNTER — Ambulatory Visit: Payer: Medicaid Other | Admitting: Nurse Practitioner

## 2019-09-21 ENCOUNTER — Inpatient Hospital Stay: Payer: Medicaid Other

## 2019-09-24 ENCOUNTER — Other Ambulatory Visit: Payer: Medicaid Other

## 2019-09-24 ENCOUNTER — Ambulatory Visit: Payer: Medicaid Other | Admitting: Nurse Practitioner

## 2019-09-28 ENCOUNTER — Encounter: Payer: Self-pay | Admitting: *Deleted

## 2019-09-28 ENCOUNTER — Telehealth: Payer: Self-pay | Admitting: Nurse Practitioner

## 2019-09-28 NOTE — Progress Notes (Signed)
Scheduling message sent for lab/OV in next 2 weeks w/NP or MD at MD request.

## 2019-09-28 NOTE — Telephone Encounter (Signed)
Scheduled per 6/25 sch message. Pt is aware of appt time and date.

## 2019-10-12 ENCOUNTER — Encounter: Payer: Self-pay | Admitting: Nurse Practitioner

## 2019-10-12 ENCOUNTER — Inpatient Hospital Stay: Payer: Medicaid Other

## 2019-10-12 ENCOUNTER — Inpatient Hospital Stay: Payer: Medicaid Other | Attending: Hematology | Admitting: Nurse Practitioner

## 2019-10-12 ENCOUNTER — Other Ambulatory Visit: Payer: Self-pay

## 2019-10-12 VITALS — BP 119/87 | HR 66 | Temp 98.1°F | Resp 17 | Ht 68.0 in | Wt 92.4 lb

## 2019-10-12 DIAGNOSIS — C251 Malignant neoplasm of body of pancreas: Secondary | ICD-10-CM | POA: Diagnosis not present

## 2019-10-12 DIAGNOSIS — C259 Malignant neoplasm of pancreas, unspecified: Secondary | ICD-10-CM

## 2019-10-12 DIAGNOSIS — Z95828 Presence of other vascular implants and grafts: Secondary | ICD-10-CM

## 2019-10-12 LAB — CMP (CANCER CENTER ONLY)
ALT: 59 U/L — ABNORMAL HIGH (ref 0–44)
AST: 36 U/L (ref 15–41)
Albumin: 3.7 g/dL (ref 3.5–5.0)
Alkaline Phosphatase: 110 U/L (ref 38–126)
Anion gap: 10 (ref 5–15)
BUN: 7 mg/dL (ref 6–20)
CO2: 28 mmol/L (ref 22–32)
Calcium: 9.3 mg/dL (ref 8.9–10.3)
Chloride: 100 mmol/L (ref 98–111)
Creatinine: 0.85 mg/dL (ref 0.61–1.24)
GFR, Est AFR Am: >60 mL/min (ref >60)
GFR, Estimated: >60 mL/min (ref >60)
Glucose, Bld: 119 mg/dL — ABNORMAL HIGH (ref 70–99)
Potassium: 3.9 mmol/L (ref 3.5–5.1)
Sodium: 138 mmol/L (ref 135–145)
Total Bilirubin: 0.5 mg/dL (ref 0.3–1.2)
Total Protein: 7 g/dL (ref 6.5–8.1)

## 2019-10-12 MED ORDER — SODIUM CHLORIDE 0.9% FLUSH
10.0000 mL | INTRAVENOUS | Status: DC | PRN
  Administered 2019-10-12: 10 mL via INTRAVENOUS
  Filled 2019-10-12: qty 10

## 2019-10-12 MED ORDER — HEPARIN SOD (PORK) LOCK FLUSH 100 UNIT/ML IV SOLN
500.0000 [IU] | Freq: Once | INTRAVENOUS | Status: AC
  Administered 2019-10-12: 500 [IU] via INTRAVENOUS
  Filled 2019-10-12: qty 5

## 2019-10-12 NOTE — Progress Notes (Addendum)
[place] OFFICE PROGRESS NOTE   Diagnosis: Pancreas cancer  INTERVAL HISTORY:   Gabriel [name] returns for follow-up.  He reports continued mid to low back pain and abdominal pain.  He is currently taking MS Contin 30 mg every 12 hours with MSIR 15 mg every 6 hours around-the-clock.  If he misses a dose of the MSIR he has difficulty getting pain back under control.  He denies nausea/vomiting.  No diarrhea.  No fever.  Appetite has been diminished over the past week.  Objective:  Vital signs in last 24 hours:  Blood pressure 119/87, pulse 66, temperature 98.1 F (36.7 C), temperature source Temporal, resp. rate 17, height 5\' 8"  (1.727 m), weight 92 lb 6.4 oz (41.9 kg), SpO2 100 %.    GI: Abdomen is soft.  No hepatomegaly.  Tender over the upper abdomen.  No apparent ascites. Vascular: No leg edema. Neuro: Alert and oriented. Port-A-Cath without erythema.  Lab Results:  Lab Results  Component Value Date   WBC 5.1 08/22/2019   HGB 12.5 (L) 08/22/2019   HCT 38.4 (L) 08/22/2019   MCV 91.2 08/22/2019   PLT 178 08/22/2019   NEUTROABS 3.4 08/22/2019     Medications: I have reviewed the patient's current medications.  Assessment/Plan: 1. Pancreas cancer  11/29/2018 CT abdomen/pelvis without contrast-appendix not well visualized, limited evaluation due to lack of enteric contrast and perineal fat  12/15/2018 CT abdomen/pelvis with contrast-possible 1.5 cm mass within the mid aspect of the pancreatic body with associated atrophy of the upstream pancreatic tail and associated pancreatic ductal dilatation.  Soft tissue fullness around the superior mesenteric artery and celiac axis.  Tree-in-bud nodularity within the peripheral aspect of the lower lobes bilaterally.  12/28/2018 upper EUS-2.4 cm x 1.9 cm mass in the body of the pancreas causing main pancreatic duct obstruction and clearly involving the celiac trunk.  Heterogeneous soft tissue mass in the neck measuring  3.6 cm.  Fine-needle aspiration pancreas with malignant cells consistent with adenocarcinoma.  01/16/2019 Port-A-Cath placement  01/17/2019 PET scan-suspected enlargement of the pancreatic mass with abnormal hypermetabolic activity at the junction of the pancreatic body and tail measuring 3.4 x 2.9 x 2.4 cm, thought to be a high likelihood tumor extension around the vicinity of the celiac trunk and possibly the superior mesenteric artery.  Potential mild left periaortic adenopathy maximum SUV of the indistinct left periaortic lymph nodes approximately 3.3 which is above blood pool levels.  Scattered groundglass density nodules in the lungs favoring the upper lobes with tree-in-bud nodularity in the lung bases favoring the lower lobes.  None of the lesions are hypermetabolic.  Gemcitabine/Abraxane 01/24/2019-08/22/2019  04/16/2019 CT chest/abdomen/pelvis-margins of pancreatic neoplasm difficult to identify.  Similar appearance of soft tissue infiltration within the retroperitoneum with progressive narrowing of the portal venous confluence by tumor.  No specific findings of solid organ metastasis or nodal metastasis within the abdomen or pelvis.  Unchanged appearance of multifocal subsolid nodules within the upper lung zones and bilateral lower lung zone tree-in-bud nodularity.  Right lobe of thyroid gland nodule.  This  CT 08/29/2019-development of intra and extrahepatic biliary duct dilatation with abrupt cut off at the common bile duct, abnormal soft tissue in the retroperitoneum attenuates the portal vein and the splenic vein is occluded, fullness in the head of the pancreas appears more prominent without discrete margins, subtle 12 mm focus in the medial left liver   MRI liver 09/20/2019-heterogeneous hepatic hyperenhancement favored to represent perfusion anomalies in the setting of  portal vein narrowing.  No convincing evidence of hepatic metastasis.  Locally advanced pancreatic carcinoma suboptimally  evaluated.  Persistent mild intrahepatic biliary duct dilatation.  Subtle peripancreatic edema. 2. Abdominal/back pain secondary to #1 3. Port-A-Cath placement 01/16/2019   Disposition: Gabriel [name] appears stable.  The recent MRI was negative for evidence of metastatic disease involving the liver.  The recommendation is to consider SBRT.  He agrees with this plan.  We made a referral to Dr. Lisbeth Renshaw.  For pain control he will increase MS Contin from 30 mg every 12 hours to 60 mg every 12 hours.  He will continue MSIR as needed.  He will contact the office if this is not effective.  We are obtaining a CA 19-9 today.  Port-A-Cath flush today.  We will see him in follow-up in 1 month.  Patient seen with Dr. Benay Spice.    Ned Card ANP/GNP-BC   10/12/2019  10:05 AM This was a shared visit with Ned Card.  Gabriel [name] was interviewed and examined.  He was diagnosed with unresectable pancreas cancer last year.  He has been treated with gemcitabine/Abraxane.  A CT in May revealed new biliary obstruction and enlargement of the pancreas mass.  There is no evidence of distant metastatic disease.  We referred him to consider SBRT.  We adjusted the narcotic regimen today.  Gabriel Manson, MD

## 2019-10-13 LAB — CANCER ANTIGEN 19-9: CA 19-9: 297 U/mL — ABNORMAL HIGH (ref 0–35)

## 2019-10-15 ENCOUNTER — Other Ambulatory Visit: Payer: Self-pay | Admitting: Nurse Practitioner

## 2019-10-15 DIAGNOSIS — C259 Malignant neoplasm of pancreas, unspecified: Secondary | ICD-10-CM

## 2019-10-15 MED ORDER — MORPHINE SULFATE ER 60 MG PO TBCR: 60.0000 mg | EXTENDED_RELEASE_TABLET | Freq: Two times a day (BID) | ORAL | 0 refills | Status: DC

## 2019-10-16 ENCOUNTER — Telehealth: Payer: Self-pay | Admitting: Oncology

## 2019-10-16 ENCOUNTER — Telehealth: Payer: Self-pay | Admitting: Nurse Practitioner

## 2019-10-16 NOTE — Telephone Encounter (Signed)
Scheduled appt per 7/12 sch msg - pt is aware of appts added on 7/20

## 2019-10-16 NOTE — Telephone Encounter (Signed)
Scheduled per 7/9 los. Called and spoke with pt, confirmed 8/13 appts

## 2019-10-22 NOTE — Progress Notes (Signed)
GI Location of Tumor / Histology: Pancreatic Body  Gabriel [name] presented with abdominal pain  MRI Liver 09/20/2019: heterogeneous hepatic hyperenhancement favored to represent perfusion anomalies in the setting of portal vein narrowing.  No convincing evidence of hepatic metastasis.  Locally advanced pancreatic carcinoma suboptimally evaluated.  Persistent mild intrahepatic biliary duct dilatation.  Subtle peripancreatic edema.  CT CAP 08/29/2019: development of intra and extrahepatic biliary duct dilatation with abrupt cut off at the common bile duct, abnormal soft tissue in the retroperitoneum attenuates the portal vein and the splenic vein is occluded, fullness in the head of the pancreas appears more prominent without discrete margins, subtle 12 mm focus in the medial left liver   CT CAP 04/16/2019: margins of pancreatic neoplasm difficult to identify. Similar appearance of soft tissue infiltration within the retroperitoneum with progressive narrowing of the portal venous confluence by tumor. No specific findings of solid organ metastasis or nodal metastasis within the abdomen or pelvis. Unchanged appearance of multifocal subsolid nodules within the upper lung zones and bilateral lower lung zone tree-in-bud nodularity. Right lobe of thyroid gland nodule.    PET 01/17/2019: suspected enlargement of the pancreatic mass with abnormal hypermetabolic activity at the junction of the pancreatic body and tail measuring 3.4 x 2.9 x 2.4 cm, thought to be a high likelihood tumor extension around the vicinity of the celiac trunk and possibly the superior mesenteric artery. Potential mild left periaortic adenopathy maximum SUV of the indistinct left periaortic lymph nodes approximately 3.3 which is above blood pool levels. Scattered groundglass density nodules in the lungs favoring the upper lobes with tree-in-bud nodularity in the lung bases favoring the lower lobes. None of the lesions are  hypermetabolic.  Upper EUS 12/28/2018: 2.4 cm x 1.9 cm mass in the body of the pancreas causing main pancreatic duct obstruction and clearly involving the celiac trunk. Heterogeneous soft tissue mass in the neck measuring 3.6 cm. Fine-needle aspiration pancreas with malignant cells consistent with adenocarcinoma.  CT AP 12/15/2018: Possible 1.5 cm mass within the mid aspect of the pancreatic body with associated atrophy of the upstream pancreatic tail and associated pancreatic ductal dilatation.  Soft tissue fullness around the superior mesenteric artery and celiac axis.  Tree in bud nodularity within the peripheral aspect of the lower lobes bilaterally.  CT AP 11/29/2018: Appendix is not visualized and is limited in overall evaluation secondary to lack of enteric contrast and peroneal fat. If there ispersistent concern regarding acute appendicitis, recommend repeat CT of the abdomen with oral and intravenous contrast.  Biopsies of Body of Pancreas 12/28/2019   Past/Anticipated interventions by surgeon, if any:  Not a surgical candidate.  Past/Anticipated interventions by medical oncology, if any:  NP Thomas/ Dr. Benay Spice 10/12/2019 -Gemcitabine/Abraxane 01/24/2019-08/22/2019 -The recent MRI was negative for evidence of metastatic disease involving the liver.   -A CT in May revealed new biliary obstruction and enlargement of the pancreas mass. -The recommendation is to consider SBRT.  He agrees with this plan.  We made a referral to Dr. Lisbeth Renshaw.   Weight changes, if any: Stable, has had a few pounds weight loss in the last 2 weeks.  Bowel/Bladder complaints, if any: Has some constipation.  He is using miralax and colace on occasion.  Nausea / Vomiting, if any: No  Pain issues, if any:  Lower back pain, taking morphine.   SAFETY ISSUES:  Prior radiation? No  Pacemaker/ICD? No  Possible current pregnancy? n/a  Is the patient on methotrexate? No  Current Complaints/Details: -Has port  BP 122/80   Pulse 65   Temp 98 F (36.7 C)   Resp 18   Ht 5\' 8"  (1.727 m)   Wt 91 lb 9.6 oz (41.5 kg)   SpO2 100%   BMI 13.93 kg/m    Wt Readings from Last 3 Encounters:  10/23/19 91 lb 9.6 oz (41.5 kg)  10/12/19 92 lb 6.4 oz (41.9 kg)  09/13/19 99 lb 3.3 oz (45 kg)

## 2019-10-23 ENCOUNTER — Inpatient Hospital Stay: Payer: Medicaid Other

## 2019-10-23 ENCOUNTER — Ambulatory Visit
Admission: RE | Admit: 2019-10-23 | Discharge: 2019-10-23 | Disposition: A | Discharge status: Home / Self Care | Payer: Medicaid Other | Source: Ambulatory Visit | Attending: Radiation Oncology | Admitting: Radiation Oncology

## 2019-10-23 ENCOUNTER — Other Ambulatory Visit: Payer: Self-pay

## 2019-10-23 ENCOUNTER — Encounter: Payer: Self-pay | Admitting: Radiation Oncology

## 2019-10-23 VITALS — BP 122/80 | HR 65 | Temp 98.0°F | Resp 18 | Ht 68.0 in | Wt 91.6 lb

## 2019-10-23 DIAGNOSIS — G893 Neoplasm related pain (acute) (chronic): Secondary | ICD-10-CM | POA: Insufficient documentation

## 2019-10-23 DIAGNOSIS — C259 Malignant neoplasm of pancreas, unspecified: Secondary | ICD-10-CM

## 2019-10-23 DIAGNOSIS — T402X5A Adverse effect of other opioids, initial encounter: Secondary | ICD-10-CM | POA: Diagnosis not present

## 2019-10-23 DIAGNOSIS — F17210 Nicotine dependence, cigarettes, uncomplicated: Secondary | ICD-10-CM | POA: Diagnosis not present

## 2019-10-23 DIAGNOSIS — C251 Malignant neoplasm of body of pancreas: Secondary | ICD-10-CM | POA: Diagnosis not present

## 2019-10-23 DIAGNOSIS — Z8042 Family history of malignant neoplasm of prostate: Secondary | ICD-10-CM | POA: Insufficient documentation

## 2019-10-23 DIAGNOSIS — Z803 Family history of malignant neoplasm of breast: Secondary | ICD-10-CM | POA: Diagnosis not present

## 2019-10-23 DIAGNOSIS — K5903 Drug induced constipation: Secondary | ICD-10-CM | POA: Insufficient documentation

## 2019-10-23 DIAGNOSIS — C250 Malignant neoplasm of head of pancreas: Secondary | ICD-10-CM | POA: Insufficient documentation

## 2019-10-23 DIAGNOSIS — Z79899 Other long term (current) drug therapy: Secondary | ICD-10-CM | POA: Diagnosis not present

## 2019-10-23 DIAGNOSIS — Z95828 Presence of other vascular implants and grafts: Secondary | ICD-10-CM

## 2019-10-23 LAB — CMP (CANCER CENTER ONLY)
ALT: 66 U/L — ABNORMAL HIGH (ref 0–44)
AST: 40 U/L (ref 15–41)
Albumin: 3.4 g/dL — ABNORMAL LOW (ref 3.5–5.0)
Alkaline Phosphatase: 111 U/L (ref 38–126)
Anion gap: 8 (ref 5–15)
BUN: 8 mg/dL (ref 6–20)
CO2: 28 mmol/L (ref 22–32)
Calcium: 9.4 mg/dL (ref 8.9–10.3)
Chloride: 105 mmol/L (ref 98–111)
Creatinine: 0.71 mg/dL (ref 0.61–1.24)
GFR, Est AFR Am: >60 mL/min (ref >60)
GFR, Estimated: >60 mL/min (ref >60)
Glucose, Bld: 75 mg/dL (ref 70–99)
Potassium: 3.8 mmol/L (ref 3.5–5.1)
Sodium: 141 mmol/L (ref 135–145)
Total Bilirubin: 0.4 mg/dL (ref 0.3–1.2)
Total Protein: 6.7 g/dL (ref 6.5–8.1)

## 2019-10-23 LAB — CBC WITH DIFFERENTIAL (CANCER CENTER ONLY)
Abs Immature Granulocytes: 0.01 10*3/uL (ref 0.00–0.07)
Basophils Absolute: 0 10*3/uL (ref 0.0–0.1)
Basophils Relative: 1 %
Eosinophils Absolute: 0.2 10*3/uL (ref 0.0–0.5)
Eosinophils Relative: 4 %
HCT: 39.8 % (ref 39.0–52.0)
Hemoglobin: 13.1 g/dL (ref 13.0–17.0)
Immature Granulocytes: 0 %
Lymphocytes Relative: 25 %
Lymphs Abs: 1.3 10*3/uL (ref 0.7–4.0)
MCH: 29.7 pg (ref 26.0–34.0)
MCHC: 32.9 g/dL (ref 30.0–36.0)
MCV: 90.2 fL (ref 80.0–100.0)
Monocytes Absolute: 0.5 10*3/uL (ref 0.1–1.0)
Monocytes Relative: 9 %
Neutro Abs: 3.2 10*3/uL (ref 1.7–7.7)
Neutrophils Relative %: 61 %
Platelet Count: 216 10*3/uL (ref 150–400)
RBC: 4.41 MIL/uL (ref 4.22–5.81)
RDW: 13.7 % (ref 11.5–15.5)
WBC Count: 5.1 10*3/uL (ref 4.0–10.5)
nRBC: 0 % (ref 0.0–0.2)

## 2019-10-23 MED ORDER — HEPARIN SOD (PORK) LOCK FLUSH 100 UNIT/ML IV SOLN
500.0000 [IU] | Freq: Once | INTRAVENOUS | Status: AC
  Administered 2019-10-23: 500 [IU] via INTRAVENOUS
  Filled 2019-10-23: qty 5

## 2019-10-23 MED ORDER — SODIUM CHLORIDE 0.9% FLUSH
10.0000 mL | INTRAVENOUS | Status: DC | PRN
  Administered 2019-10-23: 10 mL via INTRAVENOUS
  Filled 2019-10-23: qty 10

## 2019-10-23 NOTE — Progress Notes (Signed)
Radiation Oncology         [phone] ________________________________  Name: Gabriel [name]        MRN: [id number]  Date of Service: 10/23/2019 DOB: May 15, 1980  CV:ELFYBOF, Milford Cage, NP  Ladell Pier, MD     REFERRING PHYSICIAN: Ladell Pier, MD   DIAGNOSIS: The encounter diagnosis was Malignant neoplasm of head of pancreas ([place]).   HISTORY OF PRESENT ILLNESS: Gabriel [name] is a 39 y.o. male seen at the request of Dr. Benay Spice for a diagnosis of pancreatic cancer.  The patient was originally found in August 2020 to have abdominal pain, it was not until a repeat CT on 12/15/2018 that a 1.5 cm mass within the mid aspect of the pancreatic body was identified, there was soft tissue fullness around the SMA and celiac access, and on 12/28/2018 and EUS was performed measuring the lesion at 24 x 19 mm in the body of the pancreas causing mild pancreatic ductal obstruction and involving the celiac trunk, there was also a heterogeneous soft tissue mass in the neck measuring 3.6 cm and FNA of this revealed a malignant tumor consistent with adenocarcinoma.  PET imaging revealed hypermetabolism throughout the pancreatic body and tail measuring 3.4 x 2.9 x 2.4 cm and mild left periaortic adenopathy with an SUV of 3.3, scattered densities in the lungs were noted but were not hypermetabolic.  He received gemcitabine and Abraxane between January 24, 2019 and Aug 22, 2019.  Imaging on 08/29/2019 revealed development of intra and extrahepatic biliary ductal dilatation with cut off of the common bile duct, abnormal soft tissue in the retroperitoneum along with portal vein and splenic vein which were occluded, and fullness in the head of the pancreas with a 12 mm focus in the medial left liver.  MRI of the liver on 09/20/2019 revealed perfusion related abnormalities rather than metastatic disease.  He did undergo repeat ERCP however on 09/13/2019 and a short biliary sphincterotomy and placement of a 6 cm long  10 mm diameter uncovered metal biliary stent was performed.  He is not felt to be a good surgical candidate and rather may be a good stereotactic body radiotherapy candidate.  He is seen today to discuss this option.     PREVIOUS RADIATION THERAPY: No   PAST MEDICAL HISTORY:  Past Medical History:  Diagnosis Date  . Family history of breast cancer   . Family history of prostate cancer   . pancreatic ca dx'd 12/2018  . Pancreatic mass        PAST SURGICAL HISTORY: Past Surgical History:  Procedure Laterality Date  . BILIARY STENT PLACEMENT N/A 09/13/2019   Procedure: BILIARY STENT PLACEMENT;  Surgeon: Milus Banister, MD;  Location: [facility];  Service: Endoscopy;  Laterality: N/A;  . ENDOSCOPIC RETROGRADE CHOLANGIOPANCREATOGRAPHY (ERCP) WITH PROPOFOL N/A 09/13/2019   Procedure: ENDOSCOPIC RETROGRADE CHOLANGIOPANCREATOGRAPHY (ERCP) WITH PROPOFOL;  Surgeon: Milus Banister, MD;  Location: [facility];  Service: Endoscopy;  Laterality: N/A;  . ESOPHAGOGASTRODUODENOSCOPY (EGD) WITH PROPOFOL N/A 12/28/2018   Procedure: ESOPHAGOGASTRODUODENOSCOPY (EGD) WITH PROPOFOL;  Surgeon: Milus Banister, MD;  Location: [facility];  Service: Endoscopy;  Laterality: N/A;  . EUS N/A 12/28/2018   Procedure: UPPER ENDOSCOPIC ULTRASOUND (EUS) RADIAL;  Surgeon: Milus Banister, MD;  Location: [facility];  Service: Endoscopy;  Laterality: N/A;  . FINE NEEDLE ASPIRATION BIOPSY  12/28/2018   Procedure: FINE NEEDLE ASPIRATION BIOPSY;  Surgeon: Milus Banister, MD;  Location: [facility];  Service: Endoscopy;;  . IR IMAGING  GUIDED PORT INSERTION  01/16/2019  . none    . SPHINCTEROTOMY  09/13/2019   Procedure: SPHINCTEROTOMY;  Surgeon: Milus Banister, MD;  Location: [facility];  Service: Endoscopy;;     FAMILY HISTORY:  Family History  Problem Relation Age of Onset  . Breast cancer Mother 65       second cancer at 48  . Prostate cancer Maternal Grandfather        dx in his 58s  . Esophageal  cancer Neg Hx   . Colon cancer Neg Hx   . Pancreatic cancer Neg Hx   . Liver disease Neg Hx   . Stomach cancer Neg Hx      SOCIAL HISTORY:  reports that he has been smoking e-cigarettes. He has never used smokeless tobacco. He reports that he does not drink alcohol and does not use drugs.  The patient is single but in a relationship. He has two children, an 33 year old and an 69 year old. He's originally from [place].   ALLERGIES: Penicillins   MEDICATIONS:  Current Outpatient Medications  Medication Sig Dispense Refill  . morphine (MS CONTIN) 60 MG 12 hr tablet Take 1 tablet (60 mg total) by mouth every 12 (twelve) hours. 60 tablet 0  . morphine (MSIR) 15 MG tablet Take 1 tablet (15 mg total) by mouth every 4 (four) hours as needed for severe pain. 60 tablet 0  . potassium chloride SA (KLOR-CON) 20 MEQ tablet Take 1 tablet (20 mEq total) by mouth daily. 30 tablet 1  . lidocaine-prilocaine (EMLA) cream Apply to affected area once (Patient not taking: Reported on 09/04/2019) 30 g 3  . LORazepam (ATIVAN) 0.5 MG tablet Take 1 tablet (0.5 mg total) by mouth every 6 (six) hours as needed (Nausea or vomiting). (Patient not taking: Reported on 08/08/2019) 30 tablet 0  . ondansetron (ZOFRAN) 8 MG tablet Take 1 tablet (8 mg total) by mouth 2 (two) times daily as needed (Nausea or vomiting). (Patient not taking: Reported on 08/08/2019) 30 tablet 1  . prochlorperazine (COMPAZINE) 10 MG tablet Take 1 tablet (10 mg total) by mouth every 6 (six) hours as needed (Nausea or vomiting). (Patient not taking: Reported on 08/08/2019) 30 tablet 1   No current facility-administered medications for this encounter.   Facility-Administered Medications Ordered in Other Encounters  Medication Dose Route Frequency Provider Last Rate Last Admin  . oxyCODONE-acetaminophen (PERCOCET/ROXICET) 5-325 MG per tablet 2 tablet  2 tablet Oral Once Tish Men, MD         REVIEW OF SYSTEMS: On review of systems, the patient reports  that he is doing well overall. He reports his abdominal pain is stable but managed pretty well with long and short acting morphine. He denies any chest pain, shortness of breath, cough, fevers, chills, night sweats, unintended weight changes. He denies any bladder disturbances, and denies nausea or vomiting. He does have constipation due to his narcotics. He denies any new musculoskeletal or joint aches or pains. A complete review of systems is obtained and is otherwise negative.     PHYSICAL EXAM:  Wt Readings from Last 3 Encounters:  10/23/19 91 lb 9.6 oz (41.5 kg)  10/12/19 92 lb 6.4 oz (41.9 kg)  09/13/19 99 lb 3.3 oz (45 kg)   Temp Readings from Last 3 Encounters:  10/23/19 98 F (36.7 C)  10/12/19 98.1 F (36.7 C) (Temporal)  09/13/19 98 F (36.7 C) (Oral)   BP Readings from Last 3 Encounters:  10/23/19  122/80  10/12/19 119/87  09/13/19 132/86   Pulse Readings from Last 3 Encounters:  10/23/19 65  10/12/19 66  09/13/19 (!) 57   Pain Assessment Pain Score: 5  Pain Loc: Back (lower)/10  In general this is a thin appearing African American male in no acute distress. He's alert and oriented x4 and appropriate throughout the examination. Cardiopulmonary assessment is negative for acute distress and he exhibits normal effort.     ECOG = 1  0 - Asymptomatic (Fully active, able to carry on all predisease activities without restriction)  1 - Symptomatic but completely ambulatory (Restricted in physically strenuous activity but ambulatory and able to carry out work of a light or sedentary nature. For example, light housework, office work)  2 - Symptomatic, <50% in bed during the day (Ambulatory and capable of all self care but unable to carry out any work activities. Up and about more than 50% of waking hours)  3 - Symptomatic, >50% in bed, but not bedbound (Capable of only limited self-care, confined to bed or chair 50% or more of waking hours)  4 - Bedbound (Completely  disabled. Cannot carry on any self-care. Totally confined to bed or chair)  5 - Death   Eustace Pen MM, Creech RH, Tormey DC, et al. [phone]). "Toxicity and response criteria of the [facility]". [place] Oncol. 5 (6): 649-55    LABORATORY DATA:  Lab Results  Component Value Date   WBC 5.1 10/23/2019   HGB 13.1 10/23/2019   HCT 39.8 10/23/2019   MCV 90.2 10/23/2019   PLT 216 10/23/2019   Lab Results  Component Value Date   NA 141 10/23/2019   K 3.8 10/23/2019   CL 105 10/23/2019   CO2 28 10/23/2019   Lab Results  Component Value Date   ALT 66 (H) 10/23/2019   AST 40 10/23/2019   ALKPHOS 111 10/23/2019   BILITOT 0.4 10/23/2019      RADIOGRAPHY: No results found.     IMPRESSION/PLAN: 1. 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 Dr. Lisbeth Renshaw discusses the nature of pancreatic cancer that is unresectable. The patient is ready for a break from systemic therapy and appears to be a good candidate for stereotactic body radiotherapy (SBRT). Dr. Lisbeth Renshaw recommends proceeding with fiducial marker placement prior to proceeding. We discussed the risks, benefits, short, and long term effects of radiotherapy, and the patient is interested in proceeding. Dr. Lisbeth Renshaw discusses the delivery and logistics of radiotherapy and anticipates a course of 5 fractions of radiotherapy. Written consent is obtained and placed in the chart, a copy was provided to the patient. He will be contacted regarding fiducial marker placement and we will subsequently proceed with simulation and treatment a few days after fiducials can be placed. 2. Abdominal pain secondary to #1. He will continue his current regimen under the care of Dr. Benay Spice.   In a visit lasting 60 minutes, greater than 50% of the time was spent face to face discussing the patient's condition, in preparation for the discussion, and coordinating the patient's care.   The above documentation reflects my direct  findings during this shared patient visit. Please see the separate note by Dr. Lisbeth Renshaw on this date for the remainder of the patient's plan of care.    Carola Rhine, PAC

## 2019-10-23 NOTE — Patient Instructions (Signed)

## 2019-10-24 ENCOUNTER — Other Ambulatory Visit: Payer: Self-pay

## 2019-10-24 ENCOUNTER — Telehealth: Payer: Self-pay

## 2019-10-24 DIAGNOSIS — C259 Malignant neoplasm of pancreas, unspecified: Secondary | ICD-10-CM

## 2019-10-24 NOTE — Telephone Encounter (Signed)
-----   Message from Milus Banister, MD sent at 10/24/2019  5:14 AM EDT ----- Yes, we will get him in.  Dorean Daniello, he needs the first available EUS appt with either myself or Gabe for pancreatic cancer, fiducial placement.  Thanks   Wynetta Fines ----- Message ----- From: Hayden Pedro, PA-C Sent: 10/23/2019  10:22 PM EDT To: Milus Banister, MD, Timothy Lasso, RN  Hi there!  He has finally come to see [place] and would be a candidate for SBRT and would need fiducial marker placement to proceed. Can we coordinate placement of this with you guys?  [employer]

## 2019-10-24 NOTE — Telephone Encounter (Signed)
EUS scheduled with Dr Ardis Hughs at [facility] on 11/29/19 at 31 am.  COVID test on 11/26/19 at 935 am   EUS scheduled, pt instructed and medications reviewed.  Patient instructions mailed to home.  Patient to call with any questions or concerns.

## 2019-10-25 ENCOUNTER — Telehealth: Payer: Self-pay

## 2019-10-25 NOTE — Telephone Encounter (Signed)
The pt has been rescheduled to 11/22/19 at 1145 am.  Covid test has also been rescheduled to 8/16.  The pt has been advised and new instructions sent to the pt home and the pt has been notified via phone

## 2019-10-25 NOTE — Telephone Encounter (Signed)
-----   Message from Milus Banister, MD sent at 10/25/2019 11:40 AM EDT ----- Can you please offer him EUs appt on 8/19 in Mr. Radene Ou ERCP spot (which can be cancelled because he is getting the ERCP today).    Thanks

## 2019-10-29 ENCOUNTER — Other Ambulatory Visit: Payer: Self-pay | Admitting: Radiation Oncology

## 2019-10-29 DIAGNOSIS — C259 Malignant neoplasm of pancreas, unspecified: Secondary | ICD-10-CM

## 2019-10-29 NOTE — Progress Notes (Signed)
Noticed that pt is getting set up for fiducial marker placement on 11/22/19. He is also being contacted by our staff to schedule labs, and simulation on 11/27/19 with IV contrast.     Carola Rhine, PAC

## 2019-10-31 ENCOUNTER — Emergency Department (HOSPITAL_COMMUNITY)
Admission: EM | Admit: 2019-10-31 | Discharge: 2019-10-31 | Discharge status: Against Medical Advice | Payer: Medicaid Other

## 2019-11-03 ENCOUNTER — Other Ambulatory Visit: Payer: Self-pay | Admitting: Hematology

## 2019-11-03 MED ORDER — MORPHINE SULFATE 15 MG PO TABS: 15.0000 mg | ORAL_TABLET | ORAL | 0 refills | Status: DC | PRN

## 2019-11-09 ENCOUNTER — Encounter: Payer: Self-pay | Admitting: Oncology

## 2019-11-09 ENCOUNTER — Other Ambulatory Visit: Payer: Self-pay

## 2019-11-09 ENCOUNTER — Other Ambulatory Visit: Payer: Self-pay | Admitting: Nurse Practitioner

## 2019-11-09 DIAGNOSIS — C259 Malignant neoplasm of pancreas, unspecified: Secondary | ICD-10-CM

## 2019-11-09 LAB — GUARDANT 360

## 2019-11-09 MED ORDER — MORPHINE SULFATE ER 60 MG PO TBCR: 60.0000 mg | EXTENDED_RELEASE_TABLET | Freq: Two times a day (BID) | ORAL | 0 refills | Status: DC

## 2019-11-09 MED ORDER — MORPHINE SULFATE 15 MG PO TABS: 15.0000 mg | ORAL_TABLET | ORAL | 0 refills | Status: DC | PRN

## 2019-11-09 NOTE — Progress Notes (Signed)
Gabriel [name] called to request refills

## 2019-11-14 ENCOUNTER — Encounter: Payer: Self-pay | Admitting: General Practice

## 2019-11-14 ENCOUNTER — Encounter: Payer: Self-pay | Admitting: Oncology

## 2019-11-14 ENCOUNTER — Telehealth: Payer: Self-pay

## 2019-11-14 ENCOUNTER — Other Ambulatory Visit: Payer: Self-pay | Admitting: *Deleted

## 2019-11-14 DIAGNOSIS — C259 Malignant neoplasm of pancreas, unspecified: Secondary | ICD-10-CM

## 2019-11-14 NOTE — Telephone Encounter (Signed)
Patient called and left a message for a return call. Called patient. Patient wanted to know who his social worker is. Gave patient Edwyna Shell name and office number for him to reach out to him.

## 2019-11-14 NOTE — Progress Notes (Signed)
Spoke with patient and advised of grant balance and how it could be utilized. Advised patient per conversation and notes from [place] in [place], legal aide will contact him on housing situation. He verbalized understanding.

## 2019-11-14 NOTE — Progress Notes (Signed)
[facility] CSW Progress Notes  Call from patient - states he was informed by landlord that he needs to vacate his apartment in two days due to upcoming renovations in the complex.  He had asked to be moved to another unit, landlord informed him this morning that this was not possible.  Advised patient that CSW can refer to [employer] for possible homeless shelter placement, will also refer to [employer] to determine if landlords actions can be contested.  Messaged Financial Advocate to ask her to inform patient if he has funds left on his [employer].  Advised patient that housing issues are difficult to resolve in two days if shelter beds are full - asked him what alternative plans he could make.  States he will look into hotel w his roommate, feels he can afford this option.  Declined Medicaid funded assisted living or similar placement as this option would leave him w only $30 personal spending money/month.  Facility would take majority of his disability check as they provide room and board.  Referral made via [facility], CSW will keep patient advised of options.  Edwyna Shell, LCSW Clinical Social Worker Phone:  [phone] Cell:  [phone]

## 2019-11-15 ENCOUNTER — Telehealth: Payer: Self-pay | Admitting: General Practice

## 2019-11-15 NOTE — Telephone Encounter (Signed)
[facility] CSW Progress Notes  Called patient, he has been in contact w assigned lawyer from [employer].  Advised to pay August rent by lawyer and [place] Bossier vacate apartment tomorrow.  Lawyer has copy of his lease and will advise him on terms of lease and next steps.  In thin the meantime, patient is working to locate more affordable and/or income based housing option.  Edwyna Shell, LCSW Clinical Social Worker Phone:  [phone]

## 2019-11-16 ENCOUNTER — Inpatient Hospital Stay: Payer: Medicaid Other

## 2019-11-16 ENCOUNTER — Inpatient Hospital Stay: Payer: Medicaid Other | Attending: Hematology | Admitting: Oncology

## 2019-11-16 DIAGNOSIS — C251 Malignant neoplasm of body of pancreas: Secondary | ICD-10-CM | POA: Insufficient documentation

## 2019-11-16 DIAGNOSIS — Z79899 Other long term (current) drug therapy: Secondary | ICD-10-CM | POA: Insufficient documentation

## 2019-11-16 DIAGNOSIS — J984 Other disorders of lung: Secondary | ICD-10-CM | POA: Insufficient documentation

## 2019-11-16 DIAGNOSIS — E041 Nontoxic single thyroid nodule: Secondary | ICD-10-CM | POA: Insufficient documentation

## 2019-11-19 ENCOUNTER — Other Ambulatory Visit (HOSPITAL_COMMUNITY): Payer: Medicaid Other

## 2019-11-19 NOTE — Progress Notes (Signed)
Contacted pt regarding his Endo procedure with Dr. Ardis Hughs on 11/22/19. Pt stated that he was unable to get his COVID test today, and that he was unclear if there was any prep needed prior to the procedure and/or when he is to become NPO.   Pt advised to contact Dr. Ardis Hughs office to reschedule COVID appt and to obtain prep and /or when he is to become NPO. Pt verbalized understanding.

## 2019-11-21 NOTE — Progress Notes (Signed)
Called patient for pre procedure; patient requested new appointment as he had other "things going on" that were interfering. Asked patient to called [facility] to reschedule, gave office number.

## 2019-11-22 ENCOUNTER — Ambulatory Visit (HOSPITAL_COMMUNITY): Admission: RE | Admit: 2019-11-22 | Payer: Medicaid Other | Source: Home / Self Care | Admitting: Gastroenterology

## 2019-11-22 SURGERY — UPPER ENDOSCOPIC ULTRASOUND (EUS) RADIAL
Anesthesia: Monitor Anesthesia Care

## 2019-11-26 ENCOUNTER — Telehealth: Payer: Self-pay | Admitting: Radiation Oncology

## 2019-11-26 ENCOUNTER — Other Ambulatory Visit (HOSPITAL_COMMUNITY): Payer: Medicaid Other

## 2019-11-26 NOTE — Telephone Encounter (Signed)
Per Blenda Nicely, RN, cancel Rad Onc lab and IV start SIM for tomorrow, 8/24. Called patient to advise to still come in for Medical Onc at 8:45.

## 2019-11-27 ENCOUNTER — Inpatient Hospital Stay: Payer: Medicaid Other

## 2019-11-27 ENCOUNTER — Telehealth: Payer: Self-pay | Admitting: *Deleted

## 2019-11-27 ENCOUNTER — Ambulatory Visit: Payer: Medicaid Other

## 2019-11-27 ENCOUNTER — Telehealth: Payer: Self-pay | Admitting: Oncology

## 2019-11-27 ENCOUNTER — Ambulatory Visit: Payer: Medicaid Other | Admitting: Radiation Oncology

## 2019-11-27 ENCOUNTER — Other Ambulatory Visit: Payer: Self-pay | Admitting: Oncology

## 2019-11-27 ENCOUNTER — Inpatient Hospital Stay: Payer: Medicaid Other | Admitting: Oncology

## 2019-11-27 DIAGNOSIS — C259 Malignant neoplasm of pancreas, unspecified: Secondary | ICD-10-CM

## 2019-11-27 MED ORDER — MORPHINE SULFATE 15 MG PO TABS: 15.0000 mg | ORAL_TABLET | ORAL | 0 refills | Status: DC | PRN

## 2019-11-27 NOTE — Telephone Encounter (Signed)
Called patient to f/u on reason for cancellation today. He reports his ride did not come. Inquired if he would be interested in help from the [facility] transportation group and he said yes. Scheduling message sent to get him in on Friday with lab/flush. Email sent to transportation requesting ride for Friday's appointments. He is also requesting refill on his MSIR. MD notified.

## 2019-11-27 NOTE — Telephone Encounter (Signed)
Scheduled appt per 8/24 sch msg - left message for patient with appt date and time

## 2019-11-30 ENCOUNTER — Inpatient Hospital Stay: Payer: Medicaid Other

## 2019-11-30 ENCOUNTER — Other Ambulatory Visit: Payer: Self-pay

## 2019-11-30 ENCOUNTER — Inpatient Hospital Stay (HOSPITAL_BASED_OUTPATIENT_CLINIC_OR_DEPARTMENT_OTHER): Payer: Medicaid Other | Admitting: Oncology

## 2019-11-30 VITALS — BP 110/75 | HR 73 | Temp 98.1°F | Resp 17 | Ht 68.0 in | Wt 89.4 lb

## 2019-11-30 DIAGNOSIS — C259 Malignant neoplasm of pancreas, unspecified: Secondary | ICD-10-CM

## 2019-11-30 DIAGNOSIS — J984 Other disorders of lung: Secondary | ICD-10-CM | POA: Diagnosis not present

## 2019-11-30 DIAGNOSIS — Z95828 Presence of other vascular implants and grafts: Secondary | ICD-10-CM

## 2019-11-30 DIAGNOSIS — E041 Nontoxic single thyroid nodule: Secondary | ICD-10-CM | POA: Diagnosis not present

## 2019-11-30 DIAGNOSIS — C251 Malignant neoplasm of body of pancreas: Secondary | ICD-10-CM | POA: Diagnosis not present

## 2019-11-30 DIAGNOSIS — Z79899 Other long term (current) drug therapy: Secondary | ICD-10-CM | POA: Diagnosis not present

## 2019-11-30 LAB — BASIC METABOLIC PANEL - CANCER CENTER ONLY
Anion gap: 11 (ref 5–15)
BUN: 7 mg/dL (ref 6–20)
CO2: 27 mmol/L (ref 22–32)
Calcium: 8.9 mg/dL (ref 8.9–10.3)
Chloride: 101 mmol/L (ref 98–111)
Creatinine: 0.52 mg/dL — ABNORMAL LOW (ref 0.61–1.24)
GFR, Est AFR Am: >60 mL/min (ref >60)
GFR, Estimated: >60 mL/min (ref >60)
Glucose, Bld: 171 mg/dL — ABNORMAL HIGH (ref 70–99)
Potassium: 3.7 mmol/L (ref 3.5–5.1)
Sodium: 139 mmol/L (ref 135–145)

## 2019-11-30 MED ORDER — HEPARIN SOD (PORK) LOCK FLUSH 100 UNIT/ML IV SOLN
500.0000 [IU] | Freq: Once | INTRAVENOUS | Status: AC
  Administered 2019-11-30: 500 [IU]
  Filled 2019-11-30: qty 5

## 2019-11-30 MED ORDER — SODIUM CHLORIDE 0.9% FLUSH
10.0000 mL | INTRAVENOUS | Status: DC | PRN
  Administered 2019-11-30: 10 mL
  Filled 2019-11-30: qty 10

## 2019-11-30 NOTE — Patient Instructions (Signed)

## 2019-11-30 NOTE — Progress Notes (Signed)
[facility] OFFICE PROGRESS NOTE   Diagnosis: Pancreas cancer  INTERVAL HISTORY:   Gabriel [name] has not completed the pancreas radiation.  He has missed several scheduled appointments due to transportation issues.  He reports persistent abdominal and back pain.  The pain is relieved with MS Contin and MSIR.  For the past week he has experienced intermittent "numbness "and discomfort at the right posterior lateral chest wall.  No fever.  He has pain at the umbilicus with a deep breath.  He does not have pain at present.  He has also noted tightness and swelling of the left foot.  Objective:  Vital signs in last 24 hours:  Blood pressure 110/75, pulse 73, temperature 98.1 F (36.7 C), temperature source Tympanic, resp. rate 17, height 5\' 8"  (1.727 m), weight 89 lb 6.4 oz (40.6 kg), SpO2 98 %.     Lymphatics: No axillary nodes Resp: Decreased breath sounds at the right lower chest with dullness.  No respiratory distress Cardio: Regular rate and rhythm GI: No hepatomegaly, no mass, nontender Vascular: No leg or foot edema.  No erythema or palpable cord Neuro: The motor exam is intact in the upper extremities bilaterally Skin: No rash at the right chest wall.  Portacath/PICC-without erythema  Lab Results:  Lab Results  Component Value Date   WBC 5.1 10/23/2019   HGB 13.1 10/23/2019   HCT 39.8 10/23/2019   MCV 90.2 10/23/2019   PLT 216 10/23/2019   NEUTROABS 3.2 10/23/2019    CMP  Lab Results  Component Value Date   NA 139 11/30/2019   K 3.7 11/30/2019   CL 101 11/30/2019   CO2 27 11/30/2019   GLUCOSE 171 (H) 11/30/2019   BUN 7 11/30/2019   CREATININE 0.52 (L) 11/30/2019   CALCIUM 8.9 11/30/2019   PROT 6.7 10/23/2019   ALBUMIN 3.4 (L) 10/23/2019   AST 40 10/23/2019   ALT 66 (H) 10/23/2019   ALKPHOS 111 10/23/2019   BILITOT 0.4 10/23/2019   GFRNONAA >60 11/30/2019   GFRAA >60 11/30/2019    Medications: I have reviewed the patient's current  medications.   Assessment/Plan: 1. Pancreas cancer  11/29/2018 CT abdomen/pelvis without contrast-appendix not well visualized, limited evaluation due to lack of enteric contrast and perineal fat  12/15/2018 CT abdomen/pelvis with contrast-possible 1.5 cm mass within the mid aspect of the pancreatic body with associated atrophy of the upstream pancreatic tail and associated pancreatic ductal dilatation.  Soft tissue fullness around the superior mesenteric artery and celiac axis.  Tree-in-bud nodularity within the peripheral aspect of the lower lobes bilaterally.  12/28/2018 upper EUS-2.4 cm x 1.9 cm mass in the body of the pancreas causing main pancreatic duct obstruction and clearly involving the celiac trunk.  Heterogeneous soft tissue mass in the neck measuring 3.6 cm.  Fine-needle aspiration pancreas with malignant cells consistent with adenocarcinoma.  01/16/2019 Port-A-Cath placement  01/17/2019 PET scan-suspected enlargement of the pancreatic mass with abnormal hypermetabolic activity at the junction of the pancreatic body and tail measuring 3.4 x 2.9 x 2.4 cm, thought to be a high likelihood tumor extension around the vicinity of the celiac trunk and possibly the superior mesenteric artery.  Potential mild left periaortic adenopathy maximum SUV of the indistinct left periaortic lymph nodes approximately 3.3 which is above blood pool levels.  Scattered groundglass density nodules in the lungs favoring the upper lobes with tree-in-bud nodularity in the lung bases favoring the lower lobes.  None of the lesions are hypermetabolic.  Gemcitabine/Abraxane 01/24/2019-08/22/2019  04/16/2019 CT chest/abdomen/pelvis-margins of pancreatic neoplasm difficult to identify.  Similar appearance of soft tissue infiltration within the retroperitoneum with progressive narrowing of the portal venous confluence by tumor.  No specific findings of solid organ metastasis or nodal metastasis within the abdomen or pelvis.   Unchanged appearance of multifocal subsolid nodules within the upper lung zones and bilateral lower lung zone tree-in-bud nodularity.  Right lobe of thyroid gland nodule.  This  CT 08/29/2019-development of intra and extrahepatic biliary duct dilatation with abrupt cut off at the common bile duct, abnormal soft tissue in the retroperitoneum attenuates the portal vein and the splenic vein is occluded, fullness in the head of the pancreas appears more prominent without discrete margins, subtle 12 mm focus in the medial left liver   MRI liver 09/20/2019-heterogeneous hepatic hyperenhancement favored to represent perfusion anomalies in the setting of portal vein narrowing.  No convincing evidence of hepatic metastasis.  Locally advanced pancreatic carcinoma suboptimally evaluated.  Persistent mild intrahepatic biliary duct dilatation.  Subtle peripancreatic edema. 2. Abdominal/back pain secondary to #1 3. Port-A-Cath placement 01/16/2019     Disposition: Gabriel [name] has a history of locally advanced pancreas cancer.  He was referred for SBRT to the pancreas, but has not followed up for fiduciary placement or treatment.  I discussed treatment options with him today.  He is also a candidate for a trial of FOLFIRINOX.  He would like to proceed with radiation.  He says that he will reschedule the radiation oncology and gastroenterology appointments.  The etiology of the right chest "numbness "and discomfort is unclear.  The breath sounds are decreased in his right chest today.  He may have a pleural effusion.  He declined a chest x-ray today.  I have a low clinical suspicion for a pulmonary embolism.  Gabriel [name] will return for an office visit in approximately 3 weeks.  We are available to see him in the interim as needed.  He declined the COVID-19 vaccine. Betsy Coder, MD  11/30/2019  1:48 PM

## 2019-12-03 ENCOUNTER — Telehealth: Payer: Self-pay | Admitting: Oncology

## 2019-12-03 NOTE — Telephone Encounter (Signed)
Scheduled appointment per 8/27 los. Patient is aware of appointment date and time.

## 2019-12-06 ENCOUNTER — Telehealth: Payer: Self-pay | Admitting: Nurse Practitioner

## 2019-12-06 NOTE — Telephone Encounter (Signed)
Rescheduled appointment per 9/2 provider message. Patient is aware of scheduling change.

## 2019-12-12 ENCOUNTER — Other Ambulatory Visit: Payer: Self-pay | Admitting: Nurse Practitioner

## 2019-12-12 ENCOUNTER — Telehealth: Payer: Self-pay | Admitting: *Deleted

## 2019-12-12 DIAGNOSIS — C259 Malignant neoplasm of pancreas, unspecified: Secondary | ICD-10-CM

## 2019-12-12 MED ORDER — MORPHINE SULFATE 15 MG PO TABS: 15.0000 mg | ORAL_TABLET | ORAL | 0 refills | Status: AC | PRN

## 2019-12-12 MED ORDER — MORPHINE SULFATE ER 60 MG PO TBCR: 60.0000 mg | EXTENDED_RELEASE_TABLET | Freq: Two times a day (BID) | ORAL | 0 refills | Status: AC

## 2019-12-12 NOTE — Telephone Encounter (Signed)
Called to request refill on his MS Contin and MSIR. MD notified. Confirmed w/him that he is aware of his appointment on 12/24/19 with Ned Card, NP. Informed him that per Dr. Benay Spice, it is important for him to f/u with GI and radiation therapy. Notified radiation oncology and sent message to Dr. Eugenia Pancoast nurse as well to have him called to f/u there for his missed procedure.

## 2019-12-13 ENCOUNTER — Telehealth: Payer: Self-pay

## 2019-12-13 ENCOUNTER — Other Ambulatory Visit: Payer: Self-pay

## 2019-12-13 DIAGNOSIS — C259 Malignant neoplasm of pancreas, unspecified: Secondary | ICD-10-CM

## 2019-12-13 NOTE — Telephone Encounter (Signed)
-----   Message from Milus Banister, MD sent at 12/13/2019  7:27 AM EDT ----- Regarding: RE: Reschedule for COVID test and Endo [name]:  Is he still a candidate for SBRT? it looks like his last imaging was 3-4 months ago and may need to be updated.    I'll reschedule the fiducial placement for now, first opening is probably 3-4 weeks from now.   [name], He needs EUS with fiducial placement for pancreatic adenocarcinoma.  My first available or GM first available.  Thanks  Wynetta Fines ----- Message ----- From: Timothy Lasso, RN Sent: 12/12/2019   1:40 PM EDT To: Milus Banister, MD Subject: FW: Reschedule for COVID test and Endo         Dr Ardis Hughs can this pt be rescheduled?  ----- Message ----- From: Tania Ade, RN Sent: 12/12/2019   1:22 PM EDT To: Timothy Lasso, RN Subject: Reschedule for COVID test and Endo             I spoke to Gabriel [name] today and he is ready to reschedule his GI procedures that he canceled in August. Will your office reach out to him again? Thank you, Dutch Gray [phone]

## 2019-12-13 NOTE — Telephone Encounter (Signed)
EUS scheduled for 01/24/20 at 930 am with Dr Ardis Hughs. COVID test on 01/21/20 at 940 am.  Tried to reach pt however the voice mail is full.  I will mail all the information to the pt home.

## 2019-12-14 NOTE — Telephone Encounter (Signed)
EUS scheduled, pt instructed and medications reviewed.  Patient instructions mailed to home.  Patient to call with any questions or concerns.  

## 2019-12-20 ENCOUNTER — Telehealth: Payer: Self-pay | Admitting: Radiation Oncology

## 2019-12-20 DIAGNOSIS — C259 Malignant neoplasm of pancreas, unspecified: Secondary | ICD-10-CM

## 2019-12-20 NOTE — Telephone Encounter (Signed)
The patient was seen in July but has delayed his treatment and is canceled multiple appointments both with our department and Dr. Gearldine Shown.  He has decided he is ready to move forward with stereotactic body radiotherapy to the pancreas, he is aware of the need for fiducial marker placement and is scheduled to undergo this with Dr. Ardis Hughs on 01/24/2020.  The patient needs to have a repeat staging CT scan of the chest abdomen and pelvis per Dr. Lisbeth Renshaw and I called to explain the rationale for this as well as to plan for simulation appointment.  He is on the calendar now for simulation on 01/29/2020.  He is aware that his treatment would begin the following week.  He is encouraged to call if something changes about his needs, but he states that he will be unavailable due to traveling for a wedding anytime sooner.

## 2019-12-21 ENCOUNTER — Ambulatory Visit: Payer: Medicaid Other | Admitting: Nurse Practitioner

## 2019-12-23 ENCOUNTER — Other Ambulatory Visit: Payer: Self-pay

## 2019-12-23 ENCOUNTER — Inpatient Hospital Stay (HOSPITAL_COMMUNITY)
Admission: EM | Admit: 2019-12-23 | Discharge: 2020-02-04 | DRG: 871 | Disposition: E | Payer: Medicaid Other | Attending: Student | Admitting: Student

## 2019-12-23 ENCOUNTER — Emergency Department (HOSPITAL_COMMUNITY): Payer: Medicaid Other

## 2019-12-23 ENCOUNTER — Encounter (HOSPITAL_COMMUNITY): Payer: Self-pay | Admitting: *Deleted

## 2019-12-23 ENCOUNTER — Inpatient Hospital Stay (HOSPITAL_COMMUNITY): Payer: Medicaid Other

## 2019-12-23 DIAGNOSIS — E43 Unspecified severe protein-calorie malnutrition: Secondary | ICD-10-CM | POA: Diagnosis present

## 2019-12-23 DIAGNOSIS — Z7189 Other specified counseling: Secondary | ICD-10-CM | POA: Diagnosis not present

## 2019-12-23 DIAGNOSIS — N390 Urinary tract infection, site not specified: Secondary | ICD-10-CM | POA: Diagnosis present

## 2019-12-23 DIAGNOSIS — J9601 Acute respiratory failure with hypoxia: Secondary | ICD-10-CM | POA: Diagnosis present

## 2019-12-23 DIAGNOSIS — I471 Supraventricular tachycardia: Secondary | ICD-10-CM | POA: Diagnosis present

## 2019-12-23 DIAGNOSIS — R54 Age-related physical debility: Secondary | ICD-10-CM | POA: Diagnosis present

## 2019-12-23 DIAGNOSIS — F17290 Nicotine dependence, other tobacco product, uncomplicated: Secondary | ICD-10-CM | POA: Diagnosis present

## 2019-12-23 DIAGNOSIS — Z20822 Contact with and (suspected) exposure to covid-19: Secondary | ICD-10-CM | POA: Diagnosis present

## 2019-12-23 DIAGNOSIS — D65 Disseminated intravascular coagulation [defibrination syndrome]: Secondary | ICD-10-CM | POA: Diagnosis present

## 2019-12-23 DIAGNOSIS — I2693 Single subsegmental pulmonary embolism without acute cor pulmonale: Secondary | ICD-10-CM | POA: Diagnosis present

## 2019-12-23 DIAGNOSIS — R64 Cachexia: Secondary | ICD-10-CM | POA: Diagnosis present

## 2019-12-23 DIAGNOSIS — Z515 Encounter for palliative care: Secondary | ICD-10-CM

## 2019-12-23 DIAGNOSIS — R627 Adult failure to thrive: Secondary | ICD-10-CM | POA: Diagnosis present

## 2019-12-23 DIAGNOSIS — K831 Obstruction of bile duct: Secondary | ICD-10-CM | POA: Diagnosis present

## 2019-12-23 DIAGNOSIS — R935 Abnormal findings on diagnostic imaging of other abdominal regions, including retroperitoneum: Secondary | ICD-10-CM | POA: Diagnosis not present

## 2019-12-23 DIAGNOSIS — J9621 Acute and chronic respiratory failure with hypoxia: Secondary | ICD-10-CM

## 2019-12-23 DIAGNOSIS — D696 Thrombocytopenia, unspecified: Secondary | ICD-10-CM

## 2019-12-23 DIAGNOSIS — C259 Malignant neoplasm of pancreas, unspecified: Secondary | ICD-10-CM

## 2019-12-23 DIAGNOSIS — Z681 Body mass index (BMI) 19 or less, adult: Secondary | ICD-10-CM

## 2019-12-23 DIAGNOSIS — A419 Sepsis, unspecified organism: Secondary | ICD-10-CM | POA: Diagnosis present

## 2019-12-23 DIAGNOSIS — L8990 Pressure ulcer of unspecified site, unspecified stage: Secondary | ICD-10-CM | POA: Diagnosis present

## 2019-12-23 DIAGNOSIS — G9341 Metabolic encephalopathy: Secondary | ICD-10-CM | POA: Diagnosis present

## 2019-12-23 DIAGNOSIS — C250 Malignant neoplasm of head of pancreas: Secondary | ICD-10-CM | POA: Diagnosis present

## 2019-12-23 DIAGNOSIS — Z923 Personal history of irradiation: Secondary | ICD-10-CM

## 2019-12-23 DIAGNOSIS — R188 Other ascites: Secondary | ICD-10-CM | POA: Diagnosis present

## 2019-12-23 DIAGNOSIS — Z8042 Family history of malignant neoplasm of prostate: Secondary | ICD-10-CM

## 2019-12-23 DIAGNOSIS — Z803 Family history of malignant neoplasm of breast: Secondary | ICD-10-CM

## 2019-12-23 DIAGNOSIS — Z9221 Personal history of antineoplastic chemotherapy: Secondary | ICD-10-CM

## 2019-12-23 DIAGNOSIS — L89152 Pressure ulcer of sacral region, stage 2: Secondary | ICD-10-CM | POA: Diagnosis present

## 2019-12-23 DIAGNOSIS — Z8507 Personal history of malignant neoplasm of pancreas: Secondary | ICD-10-CM | POA: Diagnosis not present

## 2019-12-23 DIAGNOSIS — R590 Localized enlarged lymph nodes: Secondary | ICD-10-CM | POA: Diagnosis present

## 2019-12-23 DIAGNOSIS — K750 Abscess of liver: Secondary | ICD-10-CM | POA: Diagnosis present

## 2019-12-23 DIAGNOSIS — I82890 Acute embolism and thrombosis of other specified veins: Secondary | ICD-10-CM | POA: Diagnosis present

## 2019-12-23 DIAGNOSIS — Z79899 Other long term (current) drug therapy: Secondary | ICD-10-CM

## 2019-12-23 DIAGNOSIS — J90 Pleural effusion, not elsewhere classified: Secondary | ICD-10-CM | POA: Diagnosis present

## 2019-12-23 DIAGNOSIS — Z66 Do not resuscitate: Secondary | ICD-10-CM | POA: Diagnosis not present

## 2019-12-23 DIAGNOSIS — G893 Neoplasm related pain (acute) (chronic): Secondary | ICD-10-CM | POA: Diagnosis present

## 2019-12-23 DIAGNOSIS — R4182 Altered mental status, unspecified: Secondary | ICD-10-CM | POA: Diagnosis present

## 2019-12-23 DIAGNOSIS — L89309 Pressure ulcer of unspecified buttock, unspecified stage: Secondary | ICD-10-CM | POA: Diagnosis not present

## 2019-12-23 DIAGNOSIS — R262 Difficulty in walking, not elsewhere classified: Secondary | ICD-10-CM | POA: Diagnosis present

## 2019-12-23 DIAGNOSIS — E162 Hypoglycemia, unspecified: Secondary | ICD-10-CM | POA: Diagnosis present

## 2019-12-23 DIAGNOSIS — J910 Malignant pleural effusion: Secondary | ICD-10-CM | POA: Diagnosis present

## 2019-12-23 DIAGNOSIS — Z880 Allergy status to penicillin: Secondary | ICD-10-CM

## 2019-12-23 DIAGNOSIS — J9811 Atelectasis: Secondary | ICD-10-CM | POA: Diagnosis present

## 2019-12-23 DIAGNOSIS — K8309 Other cholangitis: Secondary | ICD-10-CM | POA: Diagnosis not present

## 2019-12-23 DIAGNOSIS — C7800 Secondary malignant neoplasm of unspecified lung: Secondary | ICD-10-CM | POA: Diagnosis present

## 2019-12-23 DIAGNOSIS — Z9889 Other specified postprocedural states: Secondary | ICD-10-CM

## 2019-12-23 DIAGNOSIS — B961 Klebsiella pneumoniae [K. pneumoniae] as the cause of diseases classified elsewhere: Secondary | ICD-10-CM | POA: Diagnosis present

## 2019-12-23 DIAGNOSIS — D72823 Leukemoid reaction: Secondary | ICD-10-CM | POA: Diagnosis present

## 2019-12-23 DIAGNOSIS — I2699 Other pulmonary embolism without acute cor pulmonale: Secondary | ICD-10-CM | POA: Diagnosis present

## 2019-12-23 DIAGNOSIS — E041 Nontoxic single thyroid nodule: Secondary | ICD-10-CM | POA: Diagnosis present

## 2019-12-23 DIAGNOSIS — K529 Noninfective gastroenteritis and colitis, unspecified: Secondary | ICD-10-CM | POA: Diagnosis present

## 2019-12-23 LAB — URINALYSIS, ROUTINE W REFLEX MICROSCOPIC
Bilirubin Urine: NEGATIVE
Glucose, UA: NEGATIVE mg/dL
Ketones, ur: NEGATIVE mg/dL
Nitrite: NEGATIVE
Protein, ur: NEGATIVE mg/dL
Specific Gravity, Urine: 1.017 (ref 1.005–1.030)
pH: 5 (ref 5.0–8.0)

## 2019-12-23 LAB — CBC WITH DIFFERENTIAL/PLATELET
Abs Immature Granulocytes: 1.65 10*3/uL — ABNORMAL HIGH (ref 0.00–0.07)
Abs Immature Granulocytes: 0.1 10*3/uL — ABNORMAL HIGH (ref 0.00–0.07)
Basophils Absolute: 0 10*3/uL (ref 0.0–0.1)
Basophils Absolute: 0.1 10*3/uL (ref 0.0–0.1)
Basophils Relative: 0 %
Basophils Relative: 1 %
Eosinophils Absolute: 0 10*3/uL (ref 0.0–0.5)
Eosinophils Absolute: 0 10*3/uL (ref 0.0–0.5)
Eosinophils Relative: 0 %
Eosinophils Relative: 0 %
HCT: 41.4 % (ref 39.0–52.0)
HCT: 37.9 % — ABNORMAL LOW (ref 39.0–52.0)
Hemoglobin: 14.1 g/dL (ref 13.0–17.0)
Hemoglobin: 12.8 g/dL — ABNORMAL LOW (ref 13.0–17.0)
Immature Granulocytes: 4 %
Immature Granulocytes: 1 %
Lymphocytes Relative: 1 %
Lymphocytes Relative: 3 %
Lymphs Abs: 0.4 10*3/uL — ABNORMAL LOW (ref 0.7–4.0)
Lymphs Abs: 0.4 10*3/uL — ABNORMAL LOW (ref 0.7–4.0)
MCH: 28 pg (ref 26.0–34.0)
MCH: 27.6 pg (ref 26.0–34.0)
MCHC: 34.1 g/dL (ref 30.0–36.0)
MCHC: 33.8 g/dL (ref 30.0–36.0)
MCV: 82.1 fL (ref 80.0–100.0)
MCV: 81.9 fL (ref 80.0–100.0)
Monocytes Absolute: 0.4 10*3/uL (ref 0.1–1.0)
Monocytes Absolute: 0.1 10*3/uL (ref 0.1–1.0)
Monocytes Relative: 1 %
Monocytes Relative: 1 %
Neutro Abs: 36.2 10*3/uL — ABNORMAL HIGH (ref 1.7–7.7)
Neutro Abs: 13.5 10*3/uL — ABNORMAL HIGH (ref 1.7–7.7)
Neutrophils Relative %: 94 %
Neutrophils Relative %: 94 %
Platelets: 11 10*3/uL — CL (ref 150–400)
Platelets: 7 10*3/uL — CL (ref 150–400)
RBC: 5.04 MIL/uL (ref 4.22–5.81)
RBC: 4.63 MIL/uL (ref 4.22–5.81)
RDW: 14.5 % (ref 11.5–15.5)
RDW: 14.4 % (ref 11.5–15.5)
WBC Morphology: INCREASED
WBC: 38.7 10*3/uL — ABNORMAL HIGH (ref 4.0–10.5)
WBC: 14.2 10*3/uL — ABNORMAL HIGH (ref 4.0–10.5)
nRBC: 0 % (ref 0.0–0.2)
nRBC: 0 % (ref 0.0–0.2)

## 2019-12-23 LAB — COMPREHENSIVE METABOLIC PANEL
ALT: 61 U/L — ABNORMAL HIGH (ref 0–44)
AST: 62 U/L — ABNORMAL HIGH (ref 15–41)
Albumin: 2.1 g/dL — ABNORMAL LOW (ref 3.5–5.0)
Alkaline Phosphatase: 471 U/L — ABNORMAL HIGH (ref 38–126)
Anion gap: 13 (ref 5–15)
BUN: 51 mg/dL — ABNORMAL HIGH (ref 6–20)
CO2: 26 mmol/L (ref 22–32)
Calcium: 8.1 mg/dL — ABNORMAL LOW (ref 8.9–10.3)
Chloride: 97 mmol/L — ABNORMAL LOW (ref 98–111)
Creatinine, Ser: 0.59 mg/dL — ABNORMAL LOW (ref 0.61–1.24)
GFR calc Af Amer: >60 mL/min (ref >60)
GFR calc non Af Amer: >60 mL/min (ref >60)
Glucose, Bld: 48 mg/dL — ABNORMAL LOW (ref 70–99)
Potassium: 4.8 mmol/L (ref 3.5–5.1)
Sodium: 136 mmol/L (ref 135–145)
Total Bilirubin: 3.2 mg/dL — ABNORMAL HIGH (ref 0.3–1.2)
Total Protein: 7 g/dL (ref 6.5–8.1)

## 2019-12-23 LAB — ABO/RH: ABO/RH(D): O POS

## 2019-12-23 LAB — CBG MONITORING, ED
Glucose-Capillary: 38 mg/dL — CL (ref 70–99)
Glucose-Capillary: 86 mg/dL (ref 70–99)
Glucose-Capillary: 105 mg/dL — ABNORMAL HIGH (ref 70–99)
Glucose-Capillary: 104 mg/dL — ABNORMAL HIGH (ref 70–99)
Glucose-Capillary: 37 mg/dL — CL (ref 70–99)
Glucose-Capillary: 94 mg/dL (ref 70–99)

## 2019-12-23 LAB — SARS CORONAVIRUS 2 BY RT PCR (HOSPITAL ORDER, PERFORMED IN ~~LOC~~ HOSPITAL LAB): SARS Coronavirus 2: NEGATIVE

## 2019-12-23 LAB — PHOSPHORUS: Phosphorus: 3.4 mg/dL (ref 2.5–4.6)

## 2019-12-23 LAB — HIV ANTIBODY (ROUTINE TESTING W REFLEX): HIV Screen 4th Generation wRfx: NONREACTIVE

## 2019-12-23 LAB — PROTIME-INR
INR: 1.8 — ABNORMAL HIGH (ref 0.8–1.2)
Prothrombin Time: 20.3 seconds — ABNORMAL HIGH (ref 11.4–15.2)

## 2019-12-23 LAB — LACTIC ACID, PLASMA: Lactic Acid, Venous: 1.5 mmol/L (ref 0.5–1.9)

## 2019-12-23 MED ORDER — METRONIDAZOLE IN NACL 5-0.79 MG/ML-% IV SOLN
500.0000 mg | Freq: Once | INTRAVENOUS | Status: AC
  Administered 2019-12-23: 500 mg via INTRAVENOUS
  Filled 2019-12-23: qty 100

## 2019-12-23 MED ORDER — SODIUM CHLORIDE 0.9 % IV SOLN
2.0000 g | Freq: Once | INTRAVENOUS | Status: AC
  Administered 2019-12-23: 2 g via INTRAVENOUS
  Filled 2019-12-23: qty 2

## 2019-12-23 MED ORDER — IOHEXOL 300 MG/ML  SOLN
100.0000 mL | Freq: Once | INTRAMUSCULAR | Status: AC | PRN
  Administered 2019-12-25: 75 mL via INTRAVENOUS

## 2019-12-23 MED ORDER — DEXTROSE-NACL 5-0.9 % IV SOLN: INTRAVENOUS | Status: DC

## 2019-12-23 MED ORDER — SODIUM CHLORIDE 0.9 % IV SOLN
1.0000 g | Freq: Three times a day (TID) | INTRAVENOUS | Status: DC
  Administered 2019-12-23 – 2019-12-27 (×11): 1 g via INTRAVENOUS
  Filled 2019-12-23 (×13): qty 1

## 2019-12-23 MED ORDER — FENTANYL CITRATE (PF) 100 MCG/2ML IJ SOLN
50.0000 ug | Freq: Once | INTRAMUSCULAR | Status: AC
  Administered 2019-12-23: 50 ug via INTRAVENOUS
  Filled 2019-12-23: qty 2

## 2019-12-23 MED ORDER — SODIUM CHLORIDE 0.9% IV SOLUTION: Freq: Once | INTRAVENOUS | Status: AC

## 2019-12-23 MED ORDER — SODIUM CHLORIDE 0.9 % IV SOLN: 1.0000 g | Freq: Once | INTRAVENOUS | Status: DC

## 2019-12-23 MED ORDER — DEXTROSE 50 % IV SOLN
INTRAVENOUS | Status: AC
  Filled 2019-12-23: qty 50

## 2019-12-23 MED ORDER — SODIUM CHLORIDE 0.9 % IV BOLUS
1000.0000 mL | Freq: Once | INTRAVENOUS | Status: AC
  Administered 2019-12-23: 1000 mL via INTRAVENOUS

## 2019-12-23 MED ORDER — MORPHINE SULFATE (PF) 2 MG/ML IV SOLN
1.0000 mg | INTRAVENOUS | Status: DC | PRN
  Administered 2019-12-24 – 2019-12-28 (×6): 1 mg via INTRAVENOUS
  Filled 2019-12-23 (×7): qty 1

## 2019-12-23 MED ORDER — SODIUM CHLORIDE 0.9% FLUSH
3.0000 mL | Freq: Two times a day (BID) | INTRAVENOUS | Status: DC
  Administered 2019-12-24 – 2019-12-29 (×5): 3 mL via INTRAVENOUS

## 2019-12-23 MED ORDER — VANCOMYCIN HCL IN DEXTROSE 1-5 GM/200ML-% IV SOLN
1000.0000 mg | Freq: Once | INTRAVENOUS | Status: AC
  Administered 2019-12-23: 1000 mg via INTRAVENOUS
  Filled 2019-12-23: qty 200

## 2019-12-23 MED ORDER — IOHEXOL 350 MG/ML SOLN
100.0000 mL | Freq: Once | INTRAVENOUS | Status: AC | PRN
  Administered 2019-12-23: 100 mL via INTRAVENOUS

## 2019-12-23 MED ORDER — DEXTROSE 50 % IV SOLN
25.0000 mL | Freq: Once | INTRAVENOUS | Status: AC
  Administered 2019-12-23: 25 mL via INTRAVENOUS

## 2019-12-23 MED ORDER — DEXTROSE 50 % IV SOLN
25.0000 mL | Freq: Once | INTRAVENOUS | Status: AC
  Administered 2019-12-23: 25 mL via INTRAVENOUS
  Filled 2019-12-23: qty 50

## 2019-12-23 NOTE — Progress Notes (Signed)
Pharmacy Antibiotic Note  Gabriel [name] is a 39 y.o. male admitted on 12/16/2019 with known pancreatic cancer presented to ED with weakness.  Pharmacy has been consulted for merrem dosing for UTI.  Plan: merrem 1gm IV q8h Follow renal function, cultures and clinical course      Temp (24hrs), Avg:97.3 F (36.3 C), Min:97.3 F (36.3 C), Max:97.3 F (36.3 C)  Recent Labs  Lab 12/11/2019 1036 12/05/2019 1236  WBC 38.7*  --   CREATININE 0.59*  --   LATICACIDVEN  --  1.5    CrCl cannot be calculated (Unknown ideal weight.).    Allergies  Allergen Reactions   Penicillins Swelling    Did it involve swelling of the face/tongue/throat, SOB, or low BP? Yes Did it involve sudden or severe rash/hives, skin peeling, or any reaction on the inside of your mouth or nose? No Did you need to seek medical attention at a hospital or doctor's office? No When did it last happen?10+ years If all above answers are NO, may proceed with cephalosporin use.     Antimicrobials this admission: 9/19 aztreonam x 1 9/19 flagyl x 1 9/19 vanc x 1 919 merrem >> Dose adjustments this admission:   Microbiology results: 9/19 BCx:  9/19 UCx:    Thank you for allowing pharmacy to be a part of this patients care.  Dolly Rias RPh 12/05/2019, 5:05 PM

## 2019-12-23 NOTE — ED Notes (Signed)
Saline running at 15mls p/h per aleris pump for kvo

## 2019-12-23 NOTE — Progress Notes (Signed)
A consult was received from an ED physician for vanc and aztreonam per pharmacy dosing.  The patient's profile has been reviewed for ht/wt/allergies/indication/available labs.   A one time order has been placed for vanc 1gm and aztreonam 2gm .    Further antibiotics/pharmacy consults should be ordered by admitting physician if indicated.                       Thank you, Dolly Rias RPh 12/20/2019, 12:41 PM

## 2019-12-23 NOTE — ED Notes (Signed)
Date and time results received: 12/12/2019 1720 (use smartphrase ".now" to insert current time)  Test: Plt. Critical Value: [place]  Name of Provider Notified: Dr. Neysa Bonito  Orders Received? Or Actions Taken?:

## 2019-12-23 NOTE — Progress Notes (Signed)
Patient refused CT of abdomen/pelvis Per patient "he wanted to speak with doctor prior to the scan" This technologist expressed to the patient the need for the scan, he still refused to proceed.  Patient returned to his room, Dr. Neysa Bonito was notified and asked that this technologist notify the night doctor.  Dr. Sidney Ace was paged and advised that this technologist notify Jeannette Corpus NP since she was taking care of his patients  X. Blount NP was notified that patient would like to speak with her prior to the exam.  Per X. Blount "she did not know this patient and the provider that is taking care of the patient should speak with him. I informed her that I was advised by Dr. Sidney Ace to page her and if she was not taking care of Dr. Ronnie Doss patient then who should I contact.  Her response was she is taking care of his patients tonight.  That she did not have time to come speak with the patient, and that she did not know anything about the patient. She stated that she would have to review his chart and to just have Dr. Neysa Bonito speak with the patient  in the morning.  She advised this technologist to document that patient refused his scan tonight.

## 2019-12-23 NOTE — H&P (Addendum)
History and Physical        Hospital Admission Note Date: 12/21/2019  Patient name: Gabriel [name] Medical record number: [id number] Date of birth: 21-Jun-1980 Age: 39 y.o. Gender: male  PCP: Kerin Perna, NP  Patient coming from: Home Lives with: Fianc At baseline, ambulates: Independently  Chief Complaint    No chief complaint on file.     HPI:   This is a 39 year old male with a history of metastatic pancreatic cancer on palliative chemotherapy and scheduled for radiation therapy who presents today with decreased appetite and generalized weakness for the past 3 days.  No known fever, chest pain, chills but admits to dyspnea.  Patient last urinated last night but fianc states that he has been drinking plenty of water.  Has had difficulty walking due to generalized weakness.  His fiance states that he has not had any complaints of dysuria or urinary complaints or abdominal complaints.  Patient is unable to provide a history today due to his altered mental status.  ED Course: Afebrile and hemodynamically stable on room air.  Notable labs: Glucose 48, BUN 51, creatinine 0.59, alkaline phosphatase 471, AST 62, ALT 61, WBC 38 with left shift, platelets 11, INR 1.8, UA positive for UTI.  COVID-19 negative.  CXR with new small right pleural effusion, hazy patchy right lung base atelectasis known bilateral nodules.  CTA chest: Right lower lobe subsegmental PE, moderate right-sided pleural effusion with atelectasis right lobe of the liver low-density foci with air-fluid levels however not well evaluated on this CT and recommended venous phase contrast-enhanced CT of the abdomen and pelvis.  He received broad-spectrum antibiotics with vancomycin, aztreonam, metronidazole, 1 L NS bolus, 1/2 amp D50, and fentanyl.  Vitals:   12/14/2019 1636 12/21/2019 1714  BP: (!) 119/94 113/88  Pulse:  (!) 59 67  Resp: 12 11  Temp:    SpO2: 96% 97%     Review of Systems:  Review of Systems  Unable to perform ROS: Mental status change    Medical/Social/Family History   Past Medical History: Past Medical History:  Diagnosis Date  . Family history of breast cancer   . Family history of prostate cancer   . pancreatic ca dx'd 12/2018  . Pancreatic mass     Past Surgical History:  Procedure Laterality Date  . BILIARY STENT PLACEMENT N/A 09/13/2019   Procedure: BILIARY STENT PLACEMENT;  Surgeon: Milus Banister, MD;  Location: [facility];  Service: Endoscopy;  Laterality: N/A;  . ENDOSCOPIC RETROGRADE CHOLANGIOPANCREATOGRAPHY (ERCP) WITH PROPOFOL N/A 09/13/2019   Procedure: ENDOSCOPIC RETROGRADE CHOLANGIOPANCREATOGRAPHY (ERCP) WITH PROPOFOL;  Surgeon: Milus Banister, MD;  Location: [facility];  Service: Endoscopy;  Laterality: N/A;  . ESOPHAGOGASTRODUODENOSCOPY (EGD) WITH PROPOFOL N/A 12/28/2018   Procedure: ESOPHAGOGASTRODUODENOSCOPY (EGD) WITH PROPOFOL;  Surgeon: Milus Banister, MD;  Location: [facility];  Service: Endoscopy;  Laterality: N/A;  . EUS N/A 12/28/2018   Procedure: UPPER ENDOSCOPIC ULTRASOUND (EUS) RADIAL;  Surgeon: Milus Banister, MD;  Location: [facility];  Service: Endoscopy;  Laterality: N/A;  . FINE NEEDLE ASPIRATION BIOPSY  12/28/2018   Procedure: FINE NEEDLE ASPIRATION BIOPSY;  Surgeon: Milus Banister, MD;  Location: [facility];  Service: Endoscopy;;  . IR  IMAGING GUIDED PORT INSERTION  01/16/2019  . none    . SPHINCTEROTOMY  09/13/2019   Procedure: SPHINCTEROTOMY;  Surgeon: Milus Banister, MD;  Location: [facility];  Service: Endoscopy;;    Medications: Prior to Admission medications   Medication Sig Start Date End Date Taking? Authorizing Provider  morphine (MS CONTIN) 60 MG 12 hr tablet Take 1 tablet (60 mg total) by mouth every 12 (twelve) hours. 12/12/19  Yes Owens Shark, NP  morphine (MSIR) 15 MG tablet Take 1 tablet (15 mg total)  by mouth every 4 (four) hours as needed for severe pain. 12/12/19  Yes Owens Shark, NP  lidocaine-prilocaine (EMLA) cream Apply to affected area once Patient not taking: Reported on 09/04/2019 01/23/19   Tish Men, MD  LORazepam (ATIVAN) 0.5 MG tablet Take 1 tablet (0.5 mg total) by mouth every 6 (six) hours as needed (Nausea or vomiting). Patient not taking: Reported on 08/08/2019 01/23/19   Tish Men, MD  ondansetron (ZOFRAN) 8 MG tablet Take 1 tablet (8 mg total) by mouth 2 (two) times daily as needed (Nausea or vomiting). Patient not taking: Reported on 08/08/2019 01/23/19   Tish Men, MD  potassium chloride SA (KLOR-CON) 20 MEQ tablet Take 1 tablet (20 mEq total) by mouth daily. Patient not taking: Reported on 11/09/2019 09/05/19   Owens Shark, NP  prochlorperazine (COMPAZINE) 10 MG tablet Take 1 tablet (10 mg total) by mouth every 6 (six) hours as needed (Nausea or vomiting). Patient not taking: Reported on 08/08/2019 01/23/19   Tish Men, MD    Allergies:   Allergies  Allergen Reactions  . Penicillins Swelling    Did it involve swelling of the face/tongue/throat, SOB, or low BP? Yes Did it involve sudden or severe rash/hives, skin peeling, or any reaction on the inside of your mouth or nose? No Did you need to seek medical attention at a hospital or doctor's office? No When did it last happen?10+ years If all above answers are "NO", may proceed with cephalosporin use.     Social History:  reports that he has been smoking e-cigarettes. He has never used smokeless tobacco. He reports that he does not drink alcohol and does not use drugs.  Family History: Family History  Problem Relation Age of Onset  . Breast cancer Mother 32       second cancer at 47  . Prostate cancer Maternal Grandfather        dx in his 46s  . Esophageal cancer Neg Hx   . Colon cancer Neg Hx   . Pancreatic cancer Neg Hx   . Liver disease Neg Hx   . Stomach cancer Neg Hx      Objective   Physical  Exam: Blood pressure 113/88, pulse 67, temperature (!) 97.3 F (36.3 C), temperature source Rectal, resp. rate 11, SpO2 97 %.  Physical Exam Vitals and nursing note reviewed.  Constitutional:      Appearance: He is ill-appearing.     Comments: Frail cachectic male  HENT:     Head:     Comments: Temporal wasting Eyes:     Extraocular Movements: Extraocular movements intact.  Cardiovascular:     Rate and Rhythm: Normal rate and regular rhythm.  Pulmonary:     Breath sounds: No wheezing.     Comments: Poor inspiratory effort Abdominal:     General: Abdomen is flat. There is no distension.     Tenderness: There is no abdominal tenderness.  Musculoskeletal:  General: No swelling or tenderness.     Comments: Right upper chest port  Skin:    Coloration: Skin is not jaundiced or pale.  Neurological:     Mental Status: He is disoriented.     Comments: Oriented x1 to place     LABS on Admission: I have personally reviewed all the labs and imaging below    Basic Metabolic Panel: Recent Labs  Lab 12/26/2019 1036  NA 136  K 4.8  CL 97*  CO2 26  GLUCOSE 48*  BUN 51*  CREATININE 0.59*  CALCIUM 8.1*   Liver Function Tests: Recent Labs  Lab 12/27/2019 1036  AST 62*  ALT 61*  ALKPHOS 471*  BILITOT 3.2*  PROT 7.0  ALBUMIN 2.1*   No results for input(s): LIPASE, AMYLASE in the last 168 hours. No results for input(s): AMMONIA in the last 168 hours. CBC: Recent Labs  Lab 12/21/2019 1036 01/03/2020 1708  WBC 38.7*  --   NEUTROABS 36.2*  --   HGB 14.1  --   HCT 41.4  --   MCV 82.1  --   PLT 11* 6*   Cardiac Enzymes: No results for input(s): CKTOTAL, CKMB, CKMBINDEX, TROPONINI in the last 168 hours. BNP: Invalid input(s): POCBNP CBG: Recent Labs  Lab 12/10/2019 1225 12/16/2019 1336  GLUCAP 86 105*    Radiological Exams on Admission:  CT Head Wo Contrast  Result Date: 12/22/2019 CLINICAL DATA:  Pancreatic cancer, chemotherapy, weakness, headache EXAM: CT HEAD  WITHOUT CONTRAST TECHNIQUE: Contiguous axial images were obtained from the base of the skull through the vertex without intravenous contrast. COMPARISON:  None. FINDINGS: Brain: No evidence of acute infarction, hemorrhage, hydrocephalus, extra-axial collection or mass lesion/mass effect. Vascular: No hyperdense vessel or unexpected calcification. Skull: Normal. Negative for fracture or focal lesion. Sinuses/Orbits: No acute finding. Other: None. IMPRESSION: No acute intracranial pathology. No non-contrast CT evidence of intracranial metastatic disease or findings to explain headache. Contrast enhanced MRI may be used to more sensitively evaluate for intracranial metastatic disease if desired. Electronically Signed   By: Eddie Candle M.D.   On: 01/01/2020 11:30   CT Angio Chest PE W and/or Wo Contrast  Result Date: 01/02/2020 CLINICAL DATA:  Shortness of breath and chest pain. Suspect pulmonary embolism. History of pancreatic cancer on chemotherapy. EXAM: CT ANGIOGRAPHY CHEST WITH CONTRAST TECHNIQUE: Multidetector CT imaging of the chest was performed using the standard protocol during bolus administration of intravenous contrast. Multiplanar CT image reconstructions and MIPs were obtained to evaluate the vascular anatomy. CONTRAST:  146mL OMNIPAQUE IOHEXOL 350 MG/ML SOLN COMPARISON:  08/29/2019 FINDINGS: Cardiovascular: Right IJ central venous catheter is present with tip over the cavoatrial junction. Heart is normal size. Thoracic aorta is normal caliber. Pulmonary arterial system is well opacified and demonstrates a single small embolus over a right lower lobar subsegmental pulmonary artery. No left-sided pulmonary emboli. Mediastinum/Nodes: No evidence of mediastinal or hilar adenopathy. Remaining mediastinal structures are unremarkable. Lungs/Pleura: Lungs are adequately inflated demonstrate a moderate size right pleural effusion with associated atelectasis in the right base. There are numerous small  bilateral pulmonary nodules with interval progression likely worsening metastatic disease. Airways are unremarkable. Upper Abdomen: Not well evaluated on this arterial phase CT scan. Suggestion of partial visualization of patient's known biliary stent. Moderate pneumobilia is present. Couple low-density foci over the right lobe of the liver with air-fluid levels of uncertain clinical significance. Musculoskeletal: No focal abnormality. Review of the MIP images confirms the above findings. IMPRESSION: 1. Single small  embolus over a right lower lobar subsegmental pulmonary artery. 2. Moderate size right pleural effusion with associated right basilar atelectasis. 3. Interval progression of multiple pulmonary nodules likely metastatic disease due to patient's known pancreatic cancer. 4. Couple indeterminate low-density foci over the right lobe of the liver with air-fluid levels of not well evaluated on this arterial phase contrast CT. Pneumobilia and partially visualized biliary stent. Recommend clinical correlation as further evaluation with venous phase contrast enhanced CT of the abdomen/pelvis would be helpful. Critical Value/emergent results were called by telephone at the time of interpretation on 12/19/2019 at 3:08 pm to provider [facility] , who verbally acknowledged these results. Electronically Signed   By: Marin Olp M.D.   On: 01/02/2020 15:10   DG Chest Port 1 View  Result Date: 12/26/2019 CLINICAL DATA:  Pancreatic cancer, weakness EXAM: PORTABLE CHEST 1 VIEW COMPARISON:  08/29/2019 chest CT. FINDINGS: Right internal jugular Port-A-Cath terminates at the cavoatrial junction. Normal heart size. Normal mediastinal contour. No pneumothorax. New small right pleural effusion. No left pleural effusion. No pulmonary edema. Hazy patchy right lung base opacity. Indistinct tiny nodular opacities scattered in the lower lungs bilaterally. IMPRESSION: 1. New small right pleural effusion. 2. Hazy patchy right  lung base opacity, favor atelectasis. 3. Indistinct tiny nodular opacities scattered in the lower lungs bilaterally, as seen on prior chest CT, cannot exclude metastatic disease. Electronically Signed   By: Ilona Sorrel M.D.   On: 12/10/2019 11:40      EKG: Independently reviewed.    A & P   Principal Problem:   Sepsis secondary to UTI [facility]) Active Problems:   Malignant neoplasm of head of pancreas (HCC)   Cancer-related pain   Failure to thrive in adult   Pulmonary embolism (HCC)   Pleural effusion     1. Sepsis without septic shock secondary to UTI a. Sepsis criteria: SOFA Score > 2 (Platelets <20 [4pts], T bili 3.2 [2 pts], GCS 14 [1 pt]) with infectious source (UTI).  Afebrile, tachycardic, leukocytosis (WBC 38) b. Received 1 L NS bolus in the ED and vancomycin, aztreonam, metronidazole c. Also with questionable air-fluid levels in the liver, cholangitis/abscess? -> dedicated CT abd/pelvis with contrast d. Start on meropenem due to PCN allergy e. Urine and blood cultures f. will continue maintenance fluid with D5 NS  2. Failure to thrive a. Secondary to malignancy and sepsis b. Check phosphorus level c. Nutritionist consulted d. SLP eval e. [place] discussion with patient's fiance, mother and brother who advised Full Code status f. Palliative care consulted  3. Altered mental status, likely multifactorial: hypoglycemia, opiates, sepsis  a. Treat underlying conditions as outlined  4. RLL Subsegmental PE a. Tolerating room air and normotensive b. Hold anticoagulation in the setting of thrombocytopenia  5. Thrombocytopenia a. Platelets 11->6 b. Hematology/oncology consulted, discussed with Dr. Jana Hakim i. Addendum: recommended 1 u platelets transfusion and recheck after the transfusion. Lovenox ok to give once the platelets are > 50. Consent obtained from fiance, mother and brother over the phone  6. Hypoglycemia a. Received half amp D50 b. IV fluids as  above  7. Moderate-sized right pleural effusion a. Tolerating room air b. Continue to monitor  8. Pancreatic cancer, suspected mets to the lung a. Hematology/oncology consulted b. Palliative care consulted   DVT prophylaxis: scd   Code Status: Not on file  Diet: regular Family Communication: Admission, patients condition and plan of care including tests being ordered have been discussed with the patient who indicates understanding and  agrees with the plan and Code Status. Patient's fiance was updated  Disposition Plan: The appropriate patient status for this patient is INPATIENT. Inpatient status is judged to be reasonable and necessary in order to provide the required intensity of service to ensure the patient's safety. The patient's presenting symptoms, physical exam findings, and initial radiographic and laboratory data in the context of their chronic comorbidities is felt to place them at high risk for further clinical deterioration. Furthermore, it is not anticipated that the patient will be medically stable for discharge from the hospital within 2 midnights of admission. The following factors support the patient status of inpatient.   " The patient's presenting symptoms include generalized weakness, failure to thrive and decreased urine output. " The worrisome physical exam findings include cachexia, confusion. " The initial radiographic and laboratory data are worrisome because of leukocytosis thrombocytopenia, tachycardia, positive UA, PE on CT. " The chronic co-morbidities include metastatic pancreatic cancer.   * I certify that at the point of admission it is my clinical judgment that the patient will require inpatient hospital care spanning beyond 2 midnights from the point of admission due to high intensity of service, high risk for further deterioration and high frequency of surveillance required.*   Status is: Inpatient  Remains inpatient appropriate because:Altered mental  status, Ongoing diagnostic testing needed not appropriate for outpatient work up, IV treatments appropriate due to intensity of illness or inability to take PO and Inpatient level of care appropriate due to severity of illness   Dispo: The patient is from: Home              Anticipated d/c is to: TBD              Anticipated d/c date is: > 3 days              Patient currently is not medically stable to d/c.        The medical decision making on this patient was of high complexity and the patient is at high risk for clinical deterioration, therefore this is a level 3 admission.  Consultants  . Palliative care . Hematology/oncology  Procedures  . None  Time Spent on Admission: 80 minutes    Harold Hedge, DO [facility] Pager [phone] 12/21/2019, 5:26 PM

## 2019-12-23 NOTE — ED Notes (Signed)
Low blood sugar noted and reported to Dr. Jeanell Sparrow. D50 given as ordered. Pt. Is awake, alert and calmly watching T.V. his skin remains normal, warm and dry and he is breathing normally. He is oriented x 4 with clear speech.

## 2019-12-23 NOTE — ED Notes (Signed)
Date and time results received: 12/11/2019 11:25 AM  (use smartphrase ".now" to insert current time)  Test: Plt.  Critical Value: 11k  Name of Provider Notified: Dr. Jeanell Sparrow  Orders Received? Or Actions Taken?:

## 2019-12-23 NOTE — Progress Notes (Signed)
Gabriel [name]   DOB:August 03, 1980   YS#:[id number]   XNA#:[id number]  Subjective:  39 y/o [place] man with history of locally advanced pancreatic cancer presenting to the ED with TTTh and found to be septic, severely hypoglycemic, thrombocytopenic, with elevated t bil (stent in place), and with a small right-sided subsegmental PE.  Patient seen and examined in the ED; no family present   Objective: African American man exmained in bed Vitals:   12/14/2019 1636 12/19/2019 1714  BP: (!) 119/94 113/88  Pulse: (!) 59 67  Resp: 12 11  Temp:    SpO2: 96% 97%    There is no height or weight on file to calculate BMI. No intake or output data in the 24 hours ending 12/16/2019 1718     Lungs no rales or wheezes--auscultated anterolaterally  Heart regular rate and rhythm, no tachycardia  Abdomen soft, +BS, NT to mild palpation  Neuro nonfocal; patient is very slow to answer and appears confused; aside from hypoglycemia he recently received fentanyl    CBG (last 3)  Recent Labs    12/06/2019 1137 12/05/2019 1225 12/22/2019 1336  GLUCAP 38* 86 105*     Labs:  Lab Results  Component Value Date   WBC 38.7 (H) 12/25/2019   HGB 14.1 12/28/2019   HCT 41.4 01/03/2020   MCV 82.1 12/05/2019   PLT 11 (LL) 12/20/2019   NEUTROABS 36.2 (H) 12/18/2019    @LASTCHEMISTRY @  Urine Studies No results for input(s): UHGB, CRYS in the last 72 hours.  Invalid input(s): UACOL, UAPR, USPG, UPH, UTP, UGL, UKET, UBIL, UNIT, UROB, ULEU, UEPI, UWBC, URBC, UBAC, CAST, UCOM, BILUA  Basic Metabolic Panel: Recent Labs  Lab 12/27/2019 1036  NA 136  K 4.8  CL 97*  CO2 26  GLUCOSE 48*  BUN 51*  CREATININE 0.59*  CALCIUM 8.1*   GFR CrCl cannot be calculated (Unknown ideal weight.). Liver Function Tests: Recent Labs  Lab 12/08/2019 1036  AST 62*  ALT 61*  ALKPHOS 471*  BILITOT 3.2*  PROT 7.0  ALBUMIN 2.1*   No results for input(s): LIPASE, AMYLASE in the last 168 hours. No results for input(s):  AMMONIA in the last 168 hours. Coagulation profile Recent Labs  Lab 12/06/2019 1036  INR 1.8*    CBC: Recent Labs  Lab 12/05/2019 1036  WBC 38.7*  NEUTROABS 36.2*  HGB 14.1  HCT 41.4  MCV 82.1  PLT 11*   Cardiac Enzymes: No results for input(s): CKTOTAL, CKMB, CKMBINDEX, TROPONINI in the last 168 hours. BNP: Invalid input(s): POCBNP CBG: Recent Labs  Lab 12/20/2019 1137 12/13/2019 1225 12/17/2019 1336  GLUCAP 38* 86 105*   D-Dimer No results for input(s): DDIMER in the last 72 hours. Hgb A1c No results for input(s): HGBA1C in the last 72 hours. Lipid Profile No results for input(s): CHOL, HDL, LDLCALC, TRIG, CHOLHDL, LDLDIRECT in the last 72 hours. Thyroid function studies No results for input(s): TSH, T4TOTAL, T3FREE, THYROIDAB in the last 72 hours.  Invalid input(s): FREET3 Anemia work up No results for input(s): VITAMINB12, FOLATE, FERRITIN, TIBC, IRON, RETICCTPCT in the last 72 hours. Microbiology Recent Results (from the past 240 hour(s))  SARS Coronavirus 2 by RT PCR (hospital order, performed in [facility] hospital lab) Nasopharyngeal Nasopharyngeal Swab     Status: None   Collection Time: 01/02/2020 10:36 AM   Specimen: Nasopharyngeal Swab  Result Value Ref Range Status   SARS Coronavirus 2 NEGATIVE NEGATIVE Final    Comment: (NOTE) SARS-CoV-2 target nucleic acids are NOT  DETECTED.  The SARS-CoV-2 RNA is generally detectable in upper and lower respiratory specimens during the acute phase of infection. The lowest concentration of SARS-CoV-2 viral copies this assay can detect is 250 copies / mL. A negative result does not preclude SARS-CoV-2 infection and should not be used as the sole basis for treatment or other patient management decisions.  A negative result may occur with improper specimen collection / handling, submission of specimen other than nasopharyngeal swab, presence of viral mutation(s) within the areas targeted by this assay, and inadequate  number of viral copies (<250 copies / mL). A negative result must be combined with clinical observations, patient history, and epidemiological information.  Fact Sheet for Patients:   StrictlyIdeas.no  Fact Sheet for Healthcare Providers: BankingDealers.co.za  This test is not yet approved or  cleared by the [place] FDA and has been authorized for detection and/or diagnosis of SARS-CoV-2 by FDA under an Emergency Use Authorization (EUA).  This EUA will remain in effect (meaning this test can be used) for the duration of the COVID-19 declaration under Section 564(b)(1) of the Act, 21 U.S.C. section 360bbb-3(b)(1), unless the authorization is terminated or revoked sooner.  Performed at [facility], [place] [address], [place], [place] [address]       Studies:  CT Head Wo Contrast  Result Date: 12/15/2019 CLINICAL DATA:  Pancreatic cancer, chemotherapy, weakness, headache EXAM: CT HEAD WITHOUT CONTRAST TECHNIQUE: Contiguous axial images were obtained from the base of the skull through the vertex without intravenous contrast. COMPARISON:  None. FINDINGS: Brain: No evidence of acute infarction, hemorrhage, hydrocephalus, extra-axial collection or mass lesion/mass effect. Vascular: No hyperdense vessel or unexpected calcification. Skull: Normal. Negative for fracture or focal lesion. Sinuses/Orbits: No acute finding. Other: None. IMPRESSION: No acute intracranial pathology. No non-contrast CT evidence of intracranial metastatic disease or findings to explain headache. Contrast enhanced MRI may be used to more sensitively evaluate for intracranial metastatic disease if desired. Electronically Signed   By: Eddie Candle M.D.   On: 12/29/2019 11:30   CT Angio Chest PE W and/or Wo Contrast  Result Date: 12/28/2019 CLINICAL DATA:  Shortness of breath and chest pain. Suspect pulmonary embolism. History of pancreatic cancer on  chemotherapy. EXAM: CT ANGIOGRAPHY CHEST WITH CONTRAST TECHNIQUE: Multidetector CT imaging of the chest was performed using the standard protocol during bolus administration of intravenous contrast. Multiplanar CT image reconstructions and MIPs were obtained to evaluate the vascular anatomy. CONTRAST:  164mL OMNIPAQUE IOHEXOL 350 MG/ML SOLN COMPARISON:  08/29/2019 FINDINGS: Cardiovascular: Right IJ central venous catheter is present with tip over the cavoatrial junction. Heart is normal size. Thoracic aorta is normal caliber. Pulmonary arterial system is well opacified and demonstrates a single small embolus over a right lower lobar subsegmental pulmonary artery. No left-sided pulmonary emboli. Mediastinum/Nodes: No evidence of mediastinal or hilar adenopathy. Remaining mediastinal structures are unremarkable. Lungs/Pleura: Lungs are adequately inflated demonstrate a moderate size right pleural effusion with associated atelectasis in the right base. There are numerous small bilateral pulmonary nodules with interval progression likely worsening metastatic disease. Airways are unremarkable. Upper Abdomen: Not well evaluated on this arterial phase CT scan. Suggestion of partial visualization of patient's known biliary stent. Moderate pneumobilia is present. Couple low-density foci over the right lobe of the liver with air-fluid levels of uncertain clinical significance. Musculoskeletal: No focal abnormality. Review of the MIP images confirms the above findings. IMPRESSION: 1. Single small embolus over a right lower lobar subsegmental pulmonary artery. 2. Moderate size right pleural  effusion with associated right basilar atelectasis. 3. Interval progression of multiple pulmonary nodules likely metastatic disease due to patient's known pancreatic cancer. 4. Couple indeterminate low-density foci over the right lobe of the liver with air-fluid levels of not well evaluated on this arterial phase contrast CT. Pneumobilia and  partially visualized biliary stent. Recommend clinical correlation as further evaluation with venous phase contrast enhanced CT of the abdomen/pelvis would be helpful. Critical Value/emergent results were called by telephone at the time of interpretation on 01/02/2020 at 3:08 pm to provider [facility] R[facility] , who verbally acknowledged these results. Electronically Signed   By: Marin Olp M.D.   On: 12/20/2019 15:10   DG Chest Port 1 View  Result Date: 12/22/2019 CLINICAL DATA:  Pancreatic cancer, weakness EXAM: PORTABLE CHEST 1 VIEW COMPARISON:  08/29/2019 chest CT. FINDINGS: Right internal jugular Port-A-Cath terminates at the cavoatrial junction. Normal heart size. Normal mediastinal contour. No pneumothorax. New small right pleural effusion. No left pleural effusion. No pulmonary edema. Hazy patchy right lung base opacity. Indistinct tiny nodular opacities scattered in the lower lungs bilaterally. IMPRESSION: 1. New small right pleural effusion. 2. Hazy patchy right lung base opacity, favor atelectasis. 3. Indistinct tiny nodular opacities scattered in the lower lungs bilaterally, as seen on prior chest CT, cannot exclude metastatic disease. Electronically Signed   By: Ilona Sorrel M.D.   On: 12/31/2019 11:40    Assessment: 39 y.o. [place] man with a history of locally advanced pancreatic cancer, last chemotherapy 08/22/2019 (gemcitabine/Abraxane), scheduled for radiation consolidation but missed multiple appointments secondary to transportation problems; now admitted with sepsis  And workup so far showing multiple enlarging lung nodules and a right effusion, severe hypoglycemia, severe thrombocytopenia,, hyperbilirubinemia (stent in place), small peripheral PE  From Dr Gearldine Shown prior summary:  11/29/2018 CT abdomen/pelvis without contrast-appendix not well visualized, limited evaluation due to lack of enteric contrast and perineal fat  12/15/2018 CT abdomen/pelvis with contrast-possible 1.5 cm mass  within the mid aspect of the pancreatic body with associated atrophy of the upstream pancreatic tail and associated pancreatic ductal dilatation. Soft tissue fullness around the superior mesenteric artery and celiac axis. Tree-in-bud nodularity within the peripheral aspect of the lower lobes bilaterally.  12/28/2018 upper EUS-2.4 cm x 1.9 cm mass in the body of the pancreas causing main pancreatic duct obstruction and clearly involving the celiac trunk. Heterogeneous soft tissue mass in the neck measuring 3.6 cm. Fine-needle aspiration pancreas with malignant cells consistent with adenocarcinoma.  01/16/2019 Port-A-Cath placement  01/17/2019 PET scan-suspected enlargement of the pancreatic mass with abnormal hypermetabolic activity at the junction of the pancreatic body and tail measuring 3.4 x 2.9 x 2.4 cm, thought to be a high likelihood tumor extension around the vicinity of the celiac trunk and possibly the superior mesenteric artery. Potential mild left periaortic adenopathy maximum SUV of the indistinct left periaortic lymph nodes approximately 3.3 which is above blood pool levels. Scattered groundglass density nodules in the lungs favoring the upper lobes with tree-in-bud nodularity in the lung bases favoring the lower lobes. None of the lesions are hypermetabolic.  Gemcitabine/Abraxane 01/24/2019-08/22/2019  04/16/2019 CT chest/abdomen/pelvis-margins of pancreatic neoplasm difficult to identify. Similar appearance of soft tissue infiltration within the retroperitoneum with progressive narrowing of the portal venous confluence by tumor. No specific findings of solid organ metastasis or nodal metastasis within the abdomen or pelvis. Unchanged appearance of multifocal subsolid nodules within the upper lung zones and bilateral lower lung zone tree-in-bud nodularity. Right lobe of thyroid gland nodule.  This  CT 08/29/2019-development of intra and extrahepatic biliary duct dilatation with  abrupt cut off at the common bile duct, abnormal soft tissue in the retroperitoneum attenuates the portal vein and the splenic vein is occluded, fullness in the head of the pancreas appears more prominent without discrete margins, subtle 12 mm focus in the medial left liver   MRI liver 09/20/2019-heterogeneous hepatic hyperenhancement favored to represent perfusion anomalies in the setting of portal vein narrowing.  No convincing evidence of hepatic metastasis.  Locally advanced pancreatic carcinoma suboptimally evaluated.  Persistent mild intrahepatic biliary duct dilatation.  Subtle peripancreatic edema.  REVIEW OF BLOOD FILM 01/03/2020: shows no schistocytes; no artefactual platelet clumps; no NRBCs; mild left shift in setting of neutrophilia  Plan:  (1) leukemoid reaction secondary to sepsis: would cover for possible cholangitis; stent may need to be revised (2) in absence of schistocytes, DIC and TTP unlikely; would transfuse platelets; please repeat platelet count 1-2 hours after transfusion and if >50K would start lovenox 40 mg daily; follow counts daily (3) consider right thoracentesis for cytology once patient stable (4)  hypoglycemia being corrected (5) PE is peripheral and if platelets <50K after transfusion would not anticoagulate  Appreciate your help to this patient; will alert Dr Benay Spice tomorrow re admission   Chauncey Cruel, MD 12/10/2019  5:18 PM Medical Oncology and Hematology [facility] [place], [place] [address] Tel. [phone]    Fax. [phone]

## 2019-12-23 NOTE — ED Triage Notes (Signed)
Increased weakness last 2 days, unable to eat or drink due to swallowing difficulty, memory loss, decreased mobility. bil feet edema. Unable to get VS to register in triage.

## 2019-12-23 NOTE — ED Provider Notes (Signed)
[facility] DEPT Provider Note   CSN: [id number] Arrival date & time: 01/01/2020  8841     History No chief complaint on file.   Gabriel [name] is a 39 y.o. male.  HPI   39 year old male history of pancreatic cancer treated with chemotherapy and scheduled for radiation therapy.  He presents today with his fiance, Gabriel [name].  Gabriel [name] main historian although patient endorses what she says.  Patient had last chemotherapy in July, per his history.  He has had decreased appetite and activity with generalized weakness.  This has increased over the past 3 days.  No known fever, chest pain, or chills.  Patient last urinated last night.  He has not received his Covid vaccine and has no definite exposures.  He has difficulty walking but has no reported focal neurological deficits.  This appears to be due to generalized weakness.  Past Medical History:  Diagnosis Date  . Family history of breast cancer   . Family history of prostate cancer   . pancreatic ca dx'd 12/2018  . Pancreatic mass     Patient Active Problem List   Diagnosis Date Noted  . Dilated bile duct   . Pancreatic mass   . Anemia due to antineoplastic chemotherapy 02/14/2019  . Family history of GI malignancy 02/14/2019  . Protein malnutrition ([place]) 01/24/2019  . Family history of breast cancer   . Family history of prostate cancer   . Cancer-related pain 01/10/2019  . Goals of care, counseling/discussion 01/10/2019  . Malignant neoplasm of head of pancreas ([place])   . Weight loss     Past Surgical History:  Procedure Laterality Date  . BILIARY STENT PLACEMENT N/A 09/13/2019   Procedure: BILIARY STENT PLACEMENT;  Surgeon: Milus Banister, MD;  Location: [facility];  Service: Endoscopy;  Laterality: N/A;  . ENDOSCOPIC RETROGRADE CHOLANGIOPANCREATOGRAPHY (ERCP) WITH PROPOFOL N/A 09/13/2019   Procedure: ENDOSCOPIC RETROGRADE CHOLANGIOPANCREATOGRAPHY (ERCP) WITH PROPOFOL;  Surgeon: Milus Banister, MD;  [facility];  Service: Endoscopy;  Laterality: N/A;  . ESOPHAGOGASTRODUODENOSCOPY (EGD) WITH PROPOFOL N/A 12/28/2018   Procedure: ESOPHAGOGASTRODUODENOSCOPY (EGD) WITH PROPOFOL;  Surgeon: Milus Banister, MD;  [facility]: [facility];  Service: Endoscopy;  Laterality: N/A;  . EUS N/A 12/28/2018   Procedure: UPPER ENDOSCOPIC ULTRASOUND (EUS) RADIAL;  Surgeon: Milus Banister, MD;  Location: [facility];  Service: Endoscopy;  Laterality: N/A;  . FINE NEEDLE ASPIRATION BIOPSY  12/28/2018   Procedure: FINE NEEDLE ASPIRATION BIOPSY;  Surgeon: Milus Banister, MD;  [facility];  Service: Endoscopy;;  . IR IMAGING GUIDED PORT INSERTION  01/16/2019  . none    . SPHINCTEROTOMY  09/13/2019   Procedure: SPHINCTEROTOMY;  Surgeon: Milus Banister, MD;  Location: [facility];  Service: Endoscopy;;       Family History  Problem Relation Age of Onset  . Breast cancer Mother 59       second cancer at 67  . Prostate cancer Maternal Grandfather        dx in his 16s  . Esophageal cancer Neg Hx   . Colon cancer Neg Hx   . Pancreatic cancer Neg Hx   . Liver disease Neg Hx   . Stomach cancer Neg Hx     Social History   Tobacco Use  . Smoking status: Current Every Day Smoker    Types: E-cigarettes  . Smokeless tobacco: Never Used  Vaping Use  . Vaping Use: Every day  Substance Use Topics  . Alcohol use: No  .  Drug use: No    Home Medications Prior to Admission medications   Medication Sig Start Date End Date Taking? Authorizing Provider  lidocaine-prilocaine (EMLA) cream Apply to affected area once Patient not taking: Reported on 09/04/2019 01/23/19   Tish Men, MD  LORazepam (ATIVAN) 0.5 MG tablet Take 1 tablet (0.5 mg total) by mouth every 6 (six) hours as needed (Nausea or vomiting). Patient not taking: Reported on 08/08/2019 01/23/19   Tish Men, MD  morphine (MS CONTIN) 60 MG 12 hr tablet Take 1 tablet (60 mg total) by mouth every 12 (twelve) hours. 12/12/19    Owens Shark, NP  morphine (MSIR) 15 MG tablet Take 1 tablet (15 mg total) by mouth every 4 (four) hours as needed for severe pain. 12/12/19   Owens Shark, NP  ondansetron (ZOFRAN) 8 MG tablet Take 1 tablet (8 mg total) by mouth 2 (two) times daily as needed (Nausea or vomiting). Patient not taking: Reported on 08/08/2019 01/23/19   Tish Men, MD  potassium chloride SA (KLOR-CON) 20 MEQ tablet Take 1 tablet (20 mEq total) by mouth daily. Patient not taking: Reported on 11/09/2019 09/05/19   Owens Shark, NP  prochlorperazine (COMPAZINE) 10 MG tablet Take 1 tablet (10 mg total) by mouth every 6 (six) hours as needed (Nausea or vomiting). Patient not taking: Reported on 08/08/2019 01/23/19   Tish Men, MD    Allergies    Penicillins  Review of Systems   Review of Systems  All other systems reviewed and are negative.   Physical Exam Updated Vital Signs BP 102/80 (BP Location: Left Arm)   Pulse (!) 108   Resp 17   SpO2 98%   Physical Exam Vitals and nursing note reviewed.  Constitutional:      Comments: Cachectic chronically ill-appearing male  HENT:     Head: Normocephalic.     Right Ear: External ear normal.     Left Ear: External ear normal.     Nose: Nose normal.     Mouth/Throat:     Mouth: Mucous membranes are dry.  Eyes:     Pupils: Pupils are equal, round, and reactive to light.  Cardiovascular:     Rate and Rhythm: Regular rhythm. Tachycardia present.     Pulses: Normal pulses.     Heart sounds: Normal heart sounds.  Pulmonary:     Effort: Pulmonary effort is normal.     Breath sounds: Normal breath sounds.  Abdominal:     General: Abdomen is flat.     Palpations: Abdomen is soft.     Tenderness: There is no abdominal tenderness.  Musculoskeletal:        General: Normal range of motion.     Cervical back: Normal range of motion.  Skin:    General: Skin is warm.     Capillary Refill: Capillary refill takes less than 2 seconds.  Neurological:     General: No  focal deficit present.     Mental Status: He is alert.     Comments: Patient appears to have generalized weakness and is unable to sit up in bed without assistance  Psychiatric:        Mood and Affect: Affect is flat.        Speech: Speech is delayed.        Behavior: Behavior is cooperative.     ED Results / Procedures / Treatments   Labs (all labs ordered are listed, but only abnormal results are displayed) Labs Reviewed -  No data to display  EKG None  Radiology CT Head Wo Contrast  Result Date: 12/28/2019 CLINICAL DATA:  Pancreatic cancer, chemotherapy, weakness, headache EXAM: CT HEAD WITHOUT CONTRAST TECHNIQUE: Contiguous axial images were obtained from the base of the skull through the vertex without intravenous contrast. COMPARISON:  None. FINDINGS: Brain: No evidence of acute infarction, hemorrhage, hydrocephalus, extra-axial collection or mass lesion/mass effect. Vascular: No hyperdense vessel or unexpected calcification. Skull: Normal. Negative for fracture or focal lesion. Sinuses/Orbits: No acute finding. Other: None. IMPRESSION: No acute intracranial pathology. No non-contrast CT evidence of intracranial metastatic disease or findings to explain headache. Contrast enhanced MRI may be used to more sensitively evaluate for intracranial metastatic disease if desired. Electronically Signed   By: Eddie Candle M.D.   On: 12/20/2019 11:30   CT Angio Chest PE W and/or Wo Contrast  Result Date: 12/11/2019 CLINICAL DATA:  Shortness of breath and chest pain. Suspect pulmonary embolism. History of pancreatic cancer on chemotherapy. EXAM: CT ANGIOGRAPHY CHEST WITH CONTRAST TECHNIQUE: Multidetector CT imaging of the chest was performed using the standard protocol during bolus administration of intravenous contrast. Multiplanar CT image reconstructions and MIPs were obtained to evaluate the vascular anatomy. CONTRAST:  147mL OMNIPAQUE IOHEXOL 350 MG/ML SOLN COMPARISON:  08/29/2019 FINDINGS:  Cardiovascular: Right IJ central venous catheter is present with tip over the cavoatrial junction. Heart is normal size. Thoracic aorta is normal caliber. Pulmonary arterial system is well opacified and demonstrates a single small embolus over a right lower lobar subsegmental pulmonary artery. No left-sided pulmonary emboli. Mediastinum/Nodes: No evidence of mediastinal or hilar adenopathy. Remaining mediastinal structures are unremarkable. Lungs/Pleura: Lungs are adequately inflated demonstrate a moderate size right pleural effusion with associated atelectasis in the right base. There are numerous small bilateral pulmonary nodules with interval progression likely worsening metastatic disease. Airways are unremarkable. Upper Abdomen: Not well evaluated on this arterial phase CT scan. Suggestion of partial visualization of patient's known biliary stent. Moderate pneumobilia is present. Couple low-density foci over the right lobe of the liver with air-fluid levels of uncertain clinical significance. Musculoskeletal: No focal abnormality. Review of the MIP images confirms the above findings. IMPRESSION: 1. Single small embolus over a right lower lobar subsegmental pulmonary artery. 2. Moderate size right pleural effusion with associated right basilar atelectasis. 3. Interval progression of multiple pulmonary nodules likely metastatic disease due to patient's known pancreatic cancer. 4. Couple indeterminate low-density foci over the right lobe of the liver with air-fluid levels of not well evaluated on this arterial phase contrast CT. Pneumobilia and partially visualized biliary stent. Recommend clinical correlation as further evaluation with venous phase contrast enhanced CT of the abdomen/pelvis would be helpful. Critical Value/emergent results were called by telephone at the time of interpretation on 12/19/2019 at 3:08 pm to provider [facility] Simuel Stebner , who verbally acknowledged these results. Electronically Signed   By:  Marin Olp M.D.   On: 12/12/2019 15:10   DG Chest Port 1 View  Result Date: 01/01/2020 CLINICAL DATA:  Pancreatic cancer, weakness EXAM: PORTABLE CHEST 1 VIEW COMPARISON:  08/29/2019 chest CT. FINDINGS: Right internal jugular Port-A-Cath terminates at the cavoatrial junction. Normal heart size. Normal mediastinal contour. No pneumothorax. New small right pleural effusion. No left pleural effusion. No pulmonary edema. Hazy patchy right lung base opacity. Indistinct tiny nodular opacities scattered in the lower lungs bilaterally. IMPRESSION: 1. New small right pleural effusion. 2. Hazy patchy right lung base opacity, favor atelectasis. 3. Indistinct tiny nodular opacities scattered in the lower lungs bilaterally, as  seen on prior chest CT, cannot exclude metastatic disease. Electronically Signed   By: Ilona Sorrel M.D.   On: 12/18/2019 11:40    Procedures Procedures (including critical care time)  Medications Ordered in ED Medications - No data to display  ED Course  I have reviewed the triage vital signs and the nursing notes.  Pertinent labs & imaging results that were available during my care of the patient were reviewed by me and considered in my medical decision making (see chart for details).  Clinical Course as of Dec 23 1234  Sun Dec 23, 2019  1230 Leukocytosis noted Many bacteria in urine and possible infiltrate on cxr Broad spectrum abx intiated with pcn allergy   [DR]  61 RN reported low bs 1/2 amp d50 given    [DR]    Clinical Course User Index [DR] Pattricia Boss, MD   MDM Rules/Calculators/A&P                         Plan: Admit 1- generalized weakness- patient with known pancreatic ca and significant weight loss. Leukocytosis to 38,000 with left shif- consider infection with uti.  Broad spectrum abx initiates.  Hemodynamically stable.  First lactic normal 2- dyspnea- patient with new effusion and rll subsegmental pe No active bleeding, but platelets 11,000 Will  hold anticoagulation pending hemonc input per Dr. Neysa Bonito 3-pancreatic cancer with mets- now with increasing size of lung  Nodules/mets 4- areas noted in liver on ct with air fluid levels- ? Infectious etiology.  Patient has had broad spectrum abx for initial presentation and may need further evaluation for possible liver abscesses 5-thrombocytopenia  Patient hemodynamically stable but severe acute on chronic illness.  Hospitalis, Dr. Neysa Bonito,  being consulted for admission for further evaluation and treatment  Final Clinical Impression(s) / ED Diagnoses Final diagnoses:  Urinary tract infection without hematuria, site unspecified  Malignant neoplasm of pancreas, unspecified location of malignancy ([place])  Thrombocytopenia ([place])    Rx / DC Orders ED Discharge Orders    None       Pattricia Boss, MD 12/31/2019 1533

## 2019-12-24 ENCOUNTER — Inpatient Hospital Stay: Payer: Medicaid Other | Attending: Hematology | Admitting: Nurse Practitioner

## 2019-12-24 LAB — CBG MONITORING, ED
Glucose-Capillary: 137 mg/dL — ABNORMAL HIGH (ref 70–99)
Glucose-Capillary: 62 mg/dL — ABNORMAL LOW (ref 70–99)
Glucose-Capillary: 90 mg/dL (ref 70–99)
Glucose-Capillary: 97 mg/dL (ref 70–99)

## 2019-12-24 LAB — BASIC METABOLIC PANEL
Anion gap: 10 (ref 5–15)
BUN: 33 mg/dL — ABNORMAL HIGH (ref 6–20)
CO2: 26 mmol/L (ref 22–32)
Calcium: 7.8 mg/dL — ABNORMAL LOW (ref 8.9–10.3)
Chloride: 101 mmol/L (ref 98–111)
Creatinine, Ser: 0.42 mg/dL — ABNORMAL LOW (ref 0.61–1.24)
GFR calc Af Amer: >60 mL/min (ref >60)
GFR calc non Af Amer: >60 mL/min (ref >60)
Glucose, Bld: 79 mg/dL (ref 70–99)
Potassium: 4.1 mmol/L (ref 3.5–5.1)
Sodium: 137 mmol/L (ref 135–145)

## 2019-12-24 LAB — CBC
HCT: 37.1 % — ABNORMAL LOW (ref 39.0–52.0)
Hemoglobin: 12.6 g/dL — ABNORMAL LOW (ref 13.0–17.0)
MCH: 27.9 pg (ref 26.0–34.0)
MCHC: 34 g/dL (ref 30.0–36.0)
MCV: 82.1 fL (ref 80.0–100.0)
Platelets: 8 10*3/uL — CL (ref 150–400)
RBC: 4.52 MIL/uL (ref 4.22–5.81)
RDW: 14.4 % (ref 11.5–15.5)
WBC: 25.1 10*3/uL — ABNORMAL HIGH (ref 4.0–10.5)
nRBC: 0 % (ref 0.0–0.2)

## 2019-12-24 LAB — BPAM PLATELET PHERESIS
Blood Product Expiration Date: 202109192359
ISSUE DATE / TIME: 202109192105
Unit Type and Rh: 5100

## 2019-12-24 LAB — DIC (DISSEMINATED INTRAVASCULAR COAGULATION)PANEL
D-Dimer, Quant: 14.26 ug/mL-FEU — ABNORMAL HIGH (ref 0.00–0.50)
Fibrinogen: <60 mg/dL — CL (ref 210–475)
INR: 1.7 — ABNORMAL HIGH (ref 0.8–1.2)
Platelets: 6 10*3/uL — CL (ref 150–400)
Prothrombin Time: 19.3 seconds — ABNORMAL HIGH (ref 11.4–15.2)
Smear Review: NONE SEEN
aPTT: 39 seconds — ABNORMAL HIGH (ref 24–36)

## 2019-12-24 LAB — TYPE AND SCREEN
ABO/RH(D): O POS
Antibody Screen: NEGATIVE

## 2019-12-24 LAB — PREPARE PLATELET PHERESIS: Unit division: 0

## 2019-12-24 LAB — PROCALCITONIN: Procalcitonin: 119.44 ng/mL

## 2019-12-24 LAB — CORTISOL: Cortisol, Plasma: 68.8 ug/dL

## 2019-12-24 LAB — PROTIME-INR
INR: 1.9 — ABNORMAL HIGH (ref 0.8–1.2)
Prothrombin Time: 21.4 seconds — ABNORMAL HIGH (ref 11.4–15.2)

## 2019-12-24 LAB — CORTISOL-AM, BLOOD: Cortisol - AM: 72.6 ug/dL — ABNORMAL HIGH (ref 6.7–22.6)

## 2019-12-24 LAB — ABO/RH: ABO/RH(D): O POS

## 2019-12-24 MED ORDER — CHLORHEXIDINE GLUCONATE CLOTH 2 % EX PADS
6.0000 | MEDICATED_PAD | Freq: Every day | CUTANEOUS | Status: DC
  Administered 2019-12-24 – 2020-01-04 (×10): 6 via TOPICAL

## 2019-12-24 MED ORDER — SODIUM CHLORIDE 0.9% IV SOLUTION: Freq: Once | INTRAVENOUS | Status: AC

## 2019-12-24 MED ORDER — MORPHINE SULFATE ER 30 MG PO TBCR
60.0000 mg | EXTENDED_RELEASE_TABLET | Freq: Two times a day (BID) | ORAL | Status: DC
  Administered 2019-12-24 – 2020-01-03 (×18): 60 mg via ORAL
  Filled 2019-12-24 (×20): qty 2

## 2019-12-24 MED ORDER — DEXTROSE-NACL 5-0.9 % IV SOLN: INTRAVENOUS | Status: DC

## 2019-12-24 MED ORDER — SODIUM CHLORIDE 0.9% FLUSH
10.0000 mL | INTRAVENOUS | Status: DC | PRN
  Administered 2019-12-25 – 2020-01-02 (×2): 10 mL

## 2019-12-24 MED ORDER — NYSTATIN 100000 UNIT/ML MT SUSP
5.0000 mL | Freq: Four times a day (QID) | OROMUCOSAL | Status: DC
  Administered 2019-12-24 – 2020-01-04 (×24): 500000 [IU] via ORAL
  Filled 2019-12-24 (×30): qty 5

## 2019-12-24 MED ORDER — VITAMIN K1 10 MG/ML IJ SOLN
10.0000 mg | Freq: Two times a day (BID) | INTRAMUSCULAR | Status: AC
  Administered 2019-12-24 – 2019-12-25 (×3): 10 mg via SUBCUTANEOUS
  Filled 2019-12-24 (×3): qty 1

## 2019-12-24 MED ORDER — DEXTROSE 50 % IV SOLN
12.5000 g | Freq: Once | INTRAVENOUS | Status: AC
  Administered 2019-12-24: 12.5 g via INTRAVENOUS

## 2019-12-24 NOTE — Evaluation (Addendum)
Clinical/Bedside Swallow Evaluation Patient Details  Name: Gabriel [name] MRN: [id number] Date of Birth: [birth year]  Today's Date: 12/24/2019 Time: SLP Start Time (ACUTE ONLY): 3716 SLP Stop Time (ACUTE ONLY): 1750 SLP Time Calculation (min) (ACUTE ONLY): 10 min  Past Medical History:  Past Medical History:  Diagnosis Date  . Family history of breast cancer   . Family history of prostate cancer   . pancreatic ca dx'd 12/2018  . Pancreatic mass    Past Surgical History:  Past Surgical History:  Procedure Laterality Date  . BILIARY STENT PLACEMENT N/A 09/13/2019   Procedure: BILIARY STENT PLACEMENT;  Surgeon: Milus Banister, MD;  [facility]: [facility];  Service: Endoscopy;  Laterality: N/A;  . ENDOSCOPIC RETROGRADE CHOLANGIOPANCREATOGRAPHY (ERCP) WITH PROPOFOL N/A 09/13/2019   Procedure: ENDOSCOPIC RETROGRADE CHOLANGIOPANCREATOGRAPHY (ERCP) WITH PROPOFOL;  Surgeon: Milus Banister, MD;  Location: [facility];  Service: Endoscopy;  Laterality: N/A;  . ESOPHAGOGASTRODUODENOSCOPY (EGD) WITH PROPOFOL N/A 12/28/2018   Procedure: ESOPHAGOGASTRODUODENOSCOPY (EGD) WITH PROPOFOL;  Surgeon: Milus Banister, MD;  Location[facility];  Service: Endoscopy;  Laterality: N/A;  . EUS N/A 12/28/2018   Procedure: UPPER ENDOSCOPIC ULTRASOUND (EUS) RADIAL;  Surgeon: Milus Banister, MD;  Location: [facility];  Service: Endoscopy;  Laterality: N/A;  . FINE NEEDLE ASPIRATION BIOPSY  12/28/2018   Procedure: FINE NEEDLE ASPIRATION BIOPSY;  Surgeon: Milus Banister, MD;  Location: [facility];  Service: Endoscopy;;  . IR IMAGING GUIDED PORT INSERTION  01/16/2019  . none    . SPHINCTEROTOMY  09/13/2019   Procedure: SPHINCTEROTOMY;  Surgeon: Milus Banister, MD;  Location: [facility];  Service: Endoscopy;;   HPI:  39 yo male with metastatic pancreatic cancer- suspect pulmonary mets, PE, FTT admitted to the hospital with weakness.  Bacterial sepsis presumed likely from a biliary or urinary source.   He is treated with broad-spectrum antibiotics..  Per notes, oncology is recommending hospice but pt is not ready for this plan yet.  He is not a candidate for further chemo due to poor performance.  He was npo for CT w/contrast which he refused on 9/19 and 9/20.  Clear liquid diet started.  Pt admits to dysphagia - stating what he consumes will come back up after he eats/drinks.  This does not happen consistently but will happen if he consumes too much. He also admits to having some issues coughing/choking with food and liquid.  He states this dysphagia started approximately one week ago.  Pt appears withdrawn and at times does not answer SLP questions. Currently his medications are all via IV.   Assessment / Plan / Recommendation Clinical Impression  Pt with no focal CN deficit but he is grossly weak and cachectic.  He is only clear liquids and advises that what he eats/drinks at home will not stay down over the last week or so.  Posterior oral cavity noted with whitish areas concerning for oral candidiasis and pt admits to oral pain.  SLP contacted hospitalist re: these findings/concerns.  Pt consumed small amounts of cranberry juice with this SlP - subtle belch noted after -but pt denies overt reflux. SLP did not tax pt due to his report of food/drink not staying down.  With direct question cue, pt admits if he consumes small amounts, he can keep it down.  Advised he consume small frequent amounts throughout the day.    Hopefully with treatement of oral candidiasis, pt will have some improvement with swallow function. SLP will follow up x1 to determine if pt  is experiencing improvement s/p thrush treatment.  Given pt's medical diagnosis and FTT, ability to meet nutritional needs appears poor.     SLP Visit Diagnosis: Dysphagia, oral phase (R13.11);Dysphagia, unspecified (R13.10)    Aspiration Risk  Mild aspiration risk    Diet Recommendation Thin liquid (clears)   Liquid Administration via:  Cup;Straw Medication Administration: Via alternative means (via IV) Supervision: Patient able to self feed Compensations: Slow rate;Small sips/bites Postural Changes: Seated upright at 90 degrees;Remain upright for at least 30 minutes after po intake    Other  Recommendations Oral Care Recommendations: Oral care BID   Follow up Recommendations        Frequency and Duration min 1 x/week  1 week       Prognosis Prognosis for Safe Diet Advancement: Guarded Barriers to Reach Goals: Severity of deficits;Other (Comment) (medical diagnosis)      Swallow Study   General HPI: 39 yo male with metastatic pancreatic cancer, PE, FTT admitted to the hospital with FTT.  Per notes, oncology is recommending hospice but pt is not ready for this plan yet.  He is not a candidate for further chemo due to poor performance.  He was npo for CT w/contrast which he refused on 9/19 and 9/20.  Clear liquid diet started.  Pt admits to dysphagia - stating what he consumes will come back up after he eats/drinks.  This does not happen consistently but will happen if he consumes too much. He also admits to having some issues coughing/choking with food and liquid.  He states this dysphagia started approximately one week ago.  Pt appears withdrawn and at times does not answer SLP questions. Currently his medications are all via IV. Type of Study: Bedside Swallow Evaluation Diet Prior to this Study: Thin liquids Temperature Spikes Noted: No Respiratory Status: Room air History of Recent Intubation: No Behavior/Cognition: Alert;Cooperative Oral Cavity Assessment: Other (comment) (appearance of whitish patches posterior oral cavity on left, pt admits to discomfort, appears consistent with oral candidiasis) Oral Cavity - Dentition: Adequate natural dentition Vision: Functional for self-feeding Self-Feeding Abilities: Able to feed self Patient Positioning: Upright in bed Baseline Vocal Quality: Low vocal  intensity Volitional Swallow: Able to elicit    Oral/Motor/Sensory Function Overall Oral Motor/Sensory Function: Generalized oral weakness   Ice Chips Ice chips: Not tested   Thin Liquid Thin Liquid: Within functional limits Presentation: Self Fed;Straw    Nectar Thick Nectar Thick Liquid: Not tested   Honey Thick Honey Thick Liquid: Not tested   Puree Puree: Not tested   Solid     Solid: Not tested      Macario Golds 12/24/2019,6:47 PM  Kathleen Lime, MS [place] Office [phone]

## 2019-12-24 NOTE — ED Notes (Signed)
Pt used bedpan without issue, bedding changed, repositioned. Further bloodwork collected.

## 2019-12-24 NOTE — Progress Notes (Signed)
SLP Cancellation Note  Patient Details Name: Nickolai Rinks MRN: [id number] DOB: 02-02-1981   Cancelled treatment:       Reason Eval/Treat Not Completed: Medical issues which prohibited therapy (per notes, pt is to have a ct with contrast completed, npo)  Kathleen Lime, MS [facility] SLP Acute Rehab Services Office [phone]  Macario Golds 12/24/2019, 7:53 AM

## 2019-12-24 NOTE — Progress Notes (Signed)
PROGRESS NOTE    Gabriel [name]  DDU:[id number]  DOB: July 10, 1980  PCP: Kerin Perna, NP Admit date:12/08/2019 Chief compliant: generalized weakness, poor oral intake 39 year old male with a history of metastatic pancreatic cancer on palliative chemotherapy and scheduled for radiation therapy who presents today with decreased appetite and generalized weakness for the past 3 days. Unable to provide history with AMS but per fiance no c/o dysuria, fever or chills. ED Course: Afebrileand hemodynamically stable on room air.  Notable labs: Glucose 48, BUN 51, creatinine 0.59,ALP 471, AST 62, ALT 61, WBC 38K with left shift, PLT 11, INR 1.8, UA positive for UTI.  COVID-19 negative.  CXR with new small right pleural effusion, hazy patchy right lung base atelectasis known bilateral nodules.  CTA chest: Right lower lobe subsegmental PE, moderate right-sided pleural effusion with atelectasis, right lobe of the liver low-density foci with air-fluid levels however not well evaluated on this CT and recommended venous phase contrast-enhanced CT of the abdomen and pelvis.  He received broad-spectrum antibiotics with vancomycin, aztreonam, metronidazole, 1 L NS bolus, 1/2 amp D50, and fentanyl. Hospital course: Patient admitted to [facility] for further management of Sepsis with metabolic encephalopathy,severe thrombocytopenia,  failure to thrive and new PE  Started on meropenem to cover cholangitis/?hepatic abscess and UTI. Seen by Hematology/Oncology --platelet transfusion , start anticoagulation when PLT >50.   Subjective:  Cachectic male trying to get comfortable in ED stretcher. He rates abdominal pain at 7/10, diffuse, non radiating associated with nausea. IVF expired and BG 62 this morning. Denies nose bleeds, hematochezia or melena or hematemesis.Wife apparently just stepped out of the room.   Objective: Vitals:   12/24/19 0600 12/24/19 0630 12/24/19 0727 12/24/19 0806  BP: 111/73 107/82 100/76 115/87    Pulse: (!) 56 60 (!) 55 69  Resp: 14 13 10 16   Temp:      TempSrc:      SpO2: 99% 100% 99% 100%   No intake or output data in the 24 hours ending 12/24/19 0813 There were no vitals filed for this visit.  Physical Examination:  General:  Cachectic male with bitemporal wasting, mild distress due to pain Head ENT: Atraumatic normocephalic, PERRLA, neck supple Heart: S1-S2 heard, regular rate and rhythm, no murmurs.  No leg edema noted Lungs: Equal air entry bilaterally, no rhonchi or rales on exam, no accessory muscle use Abdomen: Bowel sounds heard, soft, mild diffuse tenderness with some guarding, no rebound. Extremities: No pedal edema.  No cyanosis or clubbing but with muscular wasting. Neurological: Awake alert oriented x3, generalized weakness, no focal weakness or numbness Skin: No wounds or rashes.   Data Reviewed: I have personally reviewed following labs and imaging studies  CBC: Recent Labs  Lab 12/31/2019 1036 12/22/2019 1708 12/24/2019 1925 12/24/19 0420  WBC 38.7*  --  14.2* 25.1*  NEUTROABS 36.2*  --  13.5*  --   HGB 14.1  --  12.8* 12.6*  HCT 41.4  --  37.9* 37.1*  MCV 82.1  --  81.9 82.1  PLT 11* 6* 7* 8*   Basic Metabolic Panel: Recent Labs  Lab 01/01/2020 1036 12/21/2019 1708 12/24/19 0420  NA 136  --  137  K 4.8  --  4.1  CL 97*  --  101  CO2 26  --  26  GLUCOSE 48*  --  79  BUN 51*  --  33*  CREATININE 0.59*  --  0.42*  CALCIUM 8.1*  --  7.8*  PHOS  --  3.4  --    GFR: CrCl cannot be calculated (Unknown ideal weight.). Liver Function Tests: Recent Labs  Lab 12/28/2019 1036  AST 62*  ALT 61*  ALKPHOS 471*  BILITOT 3.2*  PROT 7.0  ALBUMIN 2.1*   No results for input(s): LIPASE, AMYLASE in the last 168 hours. No results for input(s): AMMONIA in the last 168 hours. Coagulation Profile: Recent Labs  Lab 12/13/2019 1036 12/05/2019 1708 12/24/19 0420  INR 1.8* 1.7* 1.9*   Cardiac Enzymes: No results for input(s): CKTOTAL, CKMB, CKMBINDEX,  TROPONINI in the last 168 hours. BNP (last 3 results) No results for input(s): PROBNP in the last 8760 hours. HbA1C: No results for input(s): HGBA1C in the last 72 hours. CBG: Recent Labs  Lab 12/19/2019 1740 01/01/2020 1807 12/16/2019 2017 12/24/19 0028 12/24/19 0803  GLUCAP 37* 104* 94 97 62*   Lipid Profile: No results for input(s): CHOL, HDL, LDLCALC, TRIG, CHOLHDL, LDLDIRECT in the last 72 hours. Thyroid Function Tests: No results for input(s): TSH, T4TOTAL, FREET4, T3FREE, THYROIDAB in the last 72 hours. Anemia Panel: No results for input(s): VITAMINB12, FOLATE, FERRITIN, TIBC, IRON, RETICCTPCT in the last 72 hours. Sepsis Labs: Recent Labs  Lab 12/28/2019 1236 12/24/19 0420  PROCALCITON  --  119.44  LATICACIDVEN 1.5  --     Recent Results (from the past 240 hour(s))  SARS Coronavirus 2 by RT PCR (hospital order, performed in [facility] hospital lab) Nasopharyngeal Nasopharyngeal Swab     Status: None   Collection Time: 12/09/2019 10:36 AM   Specimen: Nasopharyngeal Swab  Result Value Ref Range Status   SARS Coronavirus 2 NEGATIVE NEGATIVE Final    Comment: (NOTE) SARS-CoV-2 target nucleic acids are NOT DETECTED.  The SARS-CoV-2 RNA is generally detectable in upper and lower respiratory specimens during the acute phase of infection. The lowest concentration of SARS-CoV-2 viral copies this assay can detect is 250 copies / mL. A negative result does not preclude SARS-CoV-2 infection and should not be used as the sole basis for treatment or other patient management decisions.  A negative result may occur with improper specimen collection / handling, submission of specimen other than nasopharyngeal swab, presence of viral mutation(s) within the areas targeted by this assay, and inadequate number of viral copies (<250 copies / mL). A negative result must be combined with clinical observations, patient history, and epidemiological information.  Fact Sheet for Patients:     StrictlyIdeas.no  Fact Sheet for Healthcare Providers: BankingDealers.co.za  This test is not yet approved or  cleared by the [place] FDA and has been authorized for detection and/or diagnosis of SARS-CoV-2 by FDA under an Emergency Use Authorization (EUA).  This EUA will remain in effect (meaning this test can be used) for the duration of the COVID-19 declaration under Section 564(b)(1) of the Act, 21 U.S.C. section 360bbb-3(b)(1), unless the authorization is terminated or revoked sooner.  Performed at [facility], [place] [address] [place], [place] [address]       Radiology Studies: CT Head Wo Contrast  Result Date: 12/22/2019 CLINICAL DATA:  Pancreatic cancer, chemotherapy, weakness, headache EXAM: CT HEAD WITHOUT CONTRAST TECHNIQUE: Contiguous axial images were obtained from the base of the skull through the vertex without intravenous contrast. COMPARISON:  None. FINDINGS: Brain: No evidence of acute infarction, hemorrhage, hydrocephalus, extra-axial collection or mass lesion/mass effect. Vascular: No hyperdense vessel or unexpected calcification. Skull: Normal. Negative for fracture or focal lesion. Sinuses/Orbits: No acute finding. Other: None. IMPRESSION: No acute intracranial pathology. No  non-contrast CT evidence of intracranial metastatic disease or findings to explain headache. Contrast enhanced MRI may be used to more sensitively evaluate for intracranial metastatic disease if desired. Electronically Signed   By: Eddie Candle M.D.   On: 12/13/2019 11:30   CT Angio Chest PE W and/or Wo Contrast  Result Date: 12/24/2019 CLINICAL DATA:  Shortness of breath and chest pain. Suspect pulmonary embolism. History of pancreatic cancer on chemotherapy. EXAM: CT ANGIOGRAPHY CHEST WITH CONTRAST TECHNIQUE: Multidetector CT imaging of the chest was performed using the standard protocol during bolus administration of  intravenous contrast. Multiplanar CT image reconstructions and MIPs were obtained to evaluate the vascular anatomy. CONTRAST:  132mL OMNIPAQUE IOHEXOL 350 MG/ML SOLN COMPARISON:  08/29/2019 FINDINGS: Cardiovascular: Right IJ central venous catheter is present with tip over the cavoatrial junction. Heart is normal size. Thoracic aorta is normal caliber. Pulmonary arterial system is well opacified and demonstrates a single small embolus over a right lower lobar subsegmental pulmonary artery. No left-sided pulmonary emboli. Mediastinum/Nodes: No evidence of mediastinal or hilar adenopathy. Remaining mediastinal structures are unremarkable. Lungs/Pleura: Lungs are adequately inflated demonstrate a moderate size right pleural effusion with associated atelectasis in the right base. There are numerous small bilateral pulmonary nodules with interval progression likely worsening metastatic disease. Airways are unremarkable. Upper Abdomen: Not well evaluated on this arterial phase CT scan. Suggestion of partial visualization of patient's known biliary stent. Moderate pneumobilia is present. Couple low-density foci over the right lobe of the liver with air-fluid levels of uncertain clinical significance. Musculoskeletal: No focal abnormality. Review of the MIP images confirms the above findings. IMPRESSION: 1. Single small embolus over a right lower lobar subsegmental pulmonary artery. 2. Moderate size right pleural effusion with associated right basilar atelectasis. 3. Interval progression of multiple pulmonary nodules likely metastatic disease due to patient's known pancreatic cancer. 4. Couple indeterminate low-density foci over the right lobe of the liver with air-fluid levels of not well evaluated on this arterial phase contrast CT. Pneumobilia and partially visualized biliary stent. Recommend clinical correlation as further evaluation with venous phase contrast enhanced CT of the abdomen/pelvis would be helpful. Critical  Value/emergent results were called by telephone at the time of interpretation on 01/03/2020 at 3:08 pm to provider [facility] RAY , who verbally acknowledged these results. Electronically Signed   By: Marin Olp M.D.   On: 12/22/2019 15:10   DG Chest Port 1 View  Result Date: 12/07/2019 CLINICAL DATA:  Pancreatic cancer, weakness EXAM: PORTABLE CHEST 1 VIEW COMPARISON:  08/29/2019 chest CT. FINDINGS: Right internal jugular Port-A-Cath terminates at the cavoatrial junction. Normal heart size. Normal mediastinal contour. No pneumothorax. New small right pleural effusion. No left pleural effusion. No pulmonary edema. Hazy patchy right lung base opacity. Indistinct tiny nodular opacities scattered in the lower lungs bilaterally. IMPRESSION: 1. New small right pleural effusion. 2. Hazy patchy right lung base opacity, favor atelectasis. 3. Indistinct tiny nodular opacities scattered in the lower lungs bilaterally, as seen on prior chest CT, cannot exclude metastatic disease. Electronically Signed   By: Ilona Sorrel M.D.   On: 12/09/2019 11:40      Scheduled Meds: . sodium chloride flush  3 mL Intravenous Q12H   Continuous Infusions: . dextrose 5 % and 0.9% NaCl    . meropenem (MERREM) IV 1 g (12/24/19 0556)      Assessment/Plan:  Sepsis with metabolic encephalopathy : POA. UTI vs cholangitis vs hepatic abscess. SOFA Score > 2 (Platelets <20 [4pts], T bili 3.2 [2 pts], GCS  14 [1 pt]) with infectious source.  Afebrile, tachycardic, leukocytosis (WBC 38->14->25k) . Follow blood cultures, urine cx results. Procalcitonin 119.4.   Continue IV meropenem, Will follow up contrasted CT abd results . Will consult IR/ ID if hepatic abscess suspected. Leucocytosis secondary to infection, probably haem concentrated initially vs sec to malignancy.   Severe thrombocytopenia: Platelet count dropped from 216 in July->11 now->6. No schistocytes. Seen by Hematology, recommended platelet transfusion to keep count >50.  Avoid anticoagulants until then  Pulmonary Embolism: Small, peripheral and without hemodynamic compromise. Fortunately not hypoxic either. No choice but to hold anticoagulation with sever thrombocytopenia. Per Hematology , prophylactic dose can be started if and when PLT >50 and see if tolerates  Right Pleural effusion: He has small-moderate right pleural effusion - diagnostic tap/cytology can be considered if and when PLT counts improve.  Locally advanced Pancreatic ca, ? Pulmonary mets, Failure to thrive: Patient with severe abdominal pain/nausea and transaminatis/hyperbilirubinemia limiting oral intake causing hypoglycemia. He does have biliary stent in place. Renewed dextrose fluids, check cortisol level.  Needs goals of care discussion given grave prognosis-difficult decision making process anticipated with young age and limited Rx options. Oncology following along. Of note, MRI liver 09/20/2019-heterogeneous hepatic hyperenhancement favored to represent perfusion anomalies in the setting of portal vein narrowing. No convincing evidence of hepatic metastasis. Locally advanced pancreatic carcinoma suboptimally evaluated. Persistent mild intrahepatic biliary duct dilatation. Subtle peripancreatic edema.  DVT prophylaxis: SCD Code Status: Full code as of now.  Family / Patient Communication: Wife not present at bedside Disposition Plan:   Status is: Inpatient  Remains inpatient appropriate because:Ongoing diagnostic testing needed not appropriate for outpatient work up, IV treatments appropriate due to intensity of illness or inability to take PO and Inpatient level of care appropriate due to severity of illness   Dispo: The patient is from: Home              Anticipated d/c is to: TBD              Anticipated d/c date is: 3 days              Patient currently is not medically stable to d/c.           Time spent: 35 MINS     >50% time spent in discussions with care team and  coordination of care.    Guilford Shi, MD [facility] Pager in [place]  If 7PM-7AM, please contact night-coverage [email or web address] 12/24/2019, 8:13 AM

## 2019-12-24 NOTE — Progress Notes (Addendum)
HEMATOLOGY-ONCOLOGY PROGRESS NOTE  SUBJECTIVE: The patient received 1 unit of platelets overnight. Reports a small amount of bleeding from his penis but otherwise no other bleeding reported.  Reports some discomfort over his right upper quadrant.  Denies nausea and vomiting. He is afebrile and other vital signs are stable.  Oncology History  Malignant neoplasm of head of pancreas [facility])   Initial Diagnosis   Pancreatic adenocarcinoma ([place])   12/15/2018 Imaging   CT abdomen/pelvis w/ contrast: IMPRESSION: 1. Suggestion of a 1.5 cm mass within the mid aspect of the pancreatic body with associated atrophy of the upstream pancreatic tail and associated pancreatic ductal dilatation. Recommend further evaluation with MRI. There is soft tissue fullness around the superior mesenteric artery and celiac axis. This may represent edema or potentially tumor involvement. 2. Tree-in-bud nodularity within the peripheral aspect of the lower lobes bilaterally which may be secondary to an infectious or inflammatory process. Recommend follow-up chest CT in 3 months to assess for interval change. 3. These results will be called to the ordering clinician or representative by the Radiologist Assistant, and communication documented in the PACS or zVision Dashboard.   12/29/2018 Procedure   EUS: -2.4 x 1.9cm mass in the body of the pancreas causing the main pancreatic duct obstruction and clearly involving the celiac trunk. Biopsied. -Heterogenous soft tissue mass in the neck, measuring 3.6cm maximally. This was not sampled, presumed to be a large thyroid gland.  -No peripancreatic adenopathy -CBD was normal, non-dilated.   12/29/2018 Pathology Results   CASE: [id number]   DIAGNOSIS:  - Malignant cells consistent with adenocarcinoma  - See comment.   01/17/2019 Imaging   PET: IMPRESSION: 1. Suspected enlargement of the pancreatic mass, with abnormal hypermetabolic activity at the junction of the  pancreatic body and tail currently measuring 3.4 by 2.9 by 2.4 cm. Although anatomic localization is hampered by the patient's extreme paucity of adipose tissue (making separation of adjacent structures difficult on noncontrast CT), there is thought to be a high likelihood tumor extending around the vicinity of the celiac trunk and possibly the superior mesenteric artery. In this case, MRI might provide better anatomic localization with respect to perivascular involvement. 2. Potential mild left periaortic adenopathy, maximum SUV of the indistinct left periaortic lymph nodes is approximately 3.3 which is above blood pool levels. 3. Scattered ground-glass density nodules in the lungs favoring the upper lobes, with tree-in-bud nodularity in the lung bases favoring the lower lobes. None of these lesions are hypermetabolic. While possibilities include atypical infectious process drug reaction, surveillance of the chest is recommended to exclude low-grade adenocarcinoma. 4.  Aortic Atherosclerosis (ICD10-I70.0).   01/24/2019 -  Chemotherapy   The patient had PACLitaxel-protein bound (ABRAXANE) chemo infusion 150 mg, 100 mg/m2 = 150 mg (100 % of original dose 100 mg/m2), Intravenous,  Once, 6 of 8 cycles Dose modification: 100 mg/m2 (original dose 100 mg/m2, Cycle 1, Reason: Provider Judgment) Administration: 150 mg (01/24/2019), 150 mg (02/14/2019), 150 mg (03/02/2019), 150 mg (03/16/2019), 150 mg (04/26/2019), 150 mg (07/04/2019), 150 mg (07/18/2019), 150 mg (05/09/2019), 150 mg (05/23/2019), 150 mg (08/08/2019), 150 mg (08/22/2019), 150 mg (06/22/2019) gemcitabine (GEMZAR) 1,102 mg in sodium chloride 0.9 % 250 mL chemo infusion, 800 mg/m2 = 1,102 mg (100 % of original dose 800 mg/m2), Intravenous,  Once, 6 of 8 cycles Dose modification: 800 mg/m2 (original dose 800 mg/m2, Cycle 1, Reason: Provider Judgment), 1,000 mg/m2 (original dose 800 mg/m2, Cycle 5, Reason: Provider Judgment) Administration: 1,102  mg (01/24/2019), 1,102 mg (02/14/2019),  1,102 mg (03/02/2019), 1,102 mg (03/16/2019), 1,102 mg (04/26/2019), 1,406 mg (07/04/2019), 1,406 mg (07/18/2019), 1,406 mg (05/09/2019), 1,406 mg (05/23/2019), 1,406 mg (08/08/2019), 1,406 mg (08/22/2019), 1,406 mg (06/22/2019)  for chemotherapy treatment.    02/28/2019 Cancer Staging   Staging form: Exocrine Pancreas, AJCC 8th Edition - Clinical: Stage IB (cT2, cN0, cM0) - Signed by Tish Men, MD on 02/28/2019   04/16/2019 Imaging   CT CAP: IMPRESSION: 1. Margins of pancreatic neoplasm are difficult to identify. Similar appearance of soft tissue infiltration within the retroperitoneum with progressive narrowing of the portal venous confluence by tumor. No specific findings of solid organ metastasis or nodal metastasis within the abdomen or pelvis. 2. Unchanged appearance of multifocal sub solid nodules within the upper lung zones and bilateral lower lung zone tree-in-bud nodularity. As mentioned previously findings are nonspecific and may be inflammatory or infectious in etiology. Metastatic disease is not excluded. 3. Aortic atherosclerosis. 4. Right lobe of thyroid gland nodule. Consider further evaluation with thyroid ultrasound. If patient is clinically hyperthyroid, consider nuclear medicine thyroid uptake and scan.    PHYSICAL EXAMINATION:  Vitals:   12/24/19 1056 12/24/19 1137  BP: 118/88 (!) 122/91  Pulse: 62 63  Resp: 10 16  Temp:    SpO2: 100% 100%   There were no vitals filed for this visit.  Intake/Output from previous day: 09/19 0701 - 09/20 0700 In: 100 [IV Piggyback:100] Out: -   GENERAL: Alert, cachectic, no distress SKIN: skin color, texture, turgor are normal, no rashes or significant lesions LUNGS: clear to auscultation and percussion with normal breathing effort HEART: regular rate & rhythm and no murmurs and no lower extremity edema ABDOMEN: Soft, no tenderness with palpation NEURO: alert & oriented x 3 with fluent  speech, no focal motor/sensory deficits  LABORATORY DATA:  I have reviewed the data as listed CMP Latest Ref Rng & Units 12/24/2019 12/22/2019 11/30/2019  Glucose 70 - 99 mg/dL 79 48(L) 171(H)  BUN 6 - 20 mg/dL 33(H) 51(H) 7  Creatinine 0.61 - 1.24 mg/dL 0.42(L) 0.59(L) 0.52(L)  Sodium 135 - 145 mmol/L 137 136 139  Potassium 3.5 - 5.1 mmol/L 4.1 4.8 3.7  Chloride 98 - 111 mmol/L 101 97(L) 101  CO2 22 - 32 mmol/L 26 26 27   Calcium 8.9 - 10.3 mg/dL 7.8(L) 8.1(L) 8.9  Total Protein 6.5 - 8.1 g/dL - 7.0 -  Total Bilirubin 0.3 - 1.2 mg/dL - 3.2(H) -  Alkaline Phos 38 - 126 U/L - 471(H) -  AST 15 - 41 U/L - 62(H) -  ALT 0 - 44 U/L - 61(H) -    Lab Results  Component Value Date   WBC 25.1 (H) 12/24/2019   HGB 12.6 (L) 12/24/2019   HCT 37.1 (L) 12/24/2019   MCV 82.1 12/24/2019   PLT 8 (LL) 12/24/2019   NEUTROABS 13.5 (H) 12/12/2019    CT Head Wo Contrast  Result Date: 12/22/2019 CLINICAL DATA:  Pancreatic cancer, chemotherapy, weakness, headache EXAM: CT HEAD WITHOUT CONTRAST TECHNIQUE: Contiguous axial images were obtained from the base of the skull through the vertex without intravenous contrast. COMPARISON:  None. FINDINGS: Brain: No evidence of acute infarction, hemorrhage, hydrocephalus, extra-axial collection or mass lesion/mass effect. Vascular: No hyperdense vessel or unexpected calcification. Skull: Normal. Negative for fracture or focal lesion. Sinuses/Orbits: No acute finding. Other: None. IMPRESSION: No acute intracranial pathology. No non-contrast CT evidence of intracranial metastatic disease or findings to explain headache. Contrast enhanced MRI may be used to more sensitively evaluate for intracranial  metastatic disease if desired. Electronically Signed   By: Eddie Candle M.D.   On: 12/14/2019 11:30   CT Angio Chest PE W and/or Wo Contrast  Result Date: 12/22/2019 CLINICAL DATA:  Shortness of breath and chest pain. Suspect pulmonary embolism. History of pancreatic cancer on  chemotherapy. EXAM: CT ANGIOGRAPHY CHEST WITH CONTRAST TECHNIQUE: Multidetector CT imaging of the chest was performed using the standard protocol during bolus administration of intravenous contrast. Multiplanar CT image reconstructions and MIPs were obtained to evaluate the vascular anatomy. CONTRAST:  129mL OMNIPAQUE IOHEXOL 350 MG/ML SOLN COMPARISON:  08/29/2019 FINDINGS: Cardiovascular: Right IJ central venous catheter is present with tip over the cavoatrial junction. Heart is normal size. Thoracic aorta is normal caliber. Pulmonary arterial system is well opacified and demonstrates a single small embolus over a right lower lobar subsegmental pulmonary artery. No left-sided pulmonary emboli. Mediastinum/Nodes: No evidence of mediastinal or hilar adenopathy. Remaining mediastinal structures are unremarkable. Lungs/Pleura: Lungs are adequately inflated demonstrate a moderate size right pleural effusion with associated atelectasis in the right base. There are numerous small bilateral pulmonary nodules with interval progression likely worsening metastatic disease. Airways are unremarkable. Upper Abdomen: Not well evaluated on this arterial phase CT scan. Suggestion of partial visualization of patient's known biliary stent. Moderate pneumobilia is present. Couple low-density foci over the right lobe of the liver with air-fluid levels of uncertain clinical significance. Musculoskeletal: No focal abnormality. Review of the MIP images confirms the above findings. IMPRESSION: 1. Single small embolus over a right lower lobar subsegmental pulmonary artery. 2. Moderate size right pleural effusion with associated right basilar atelectasis. 3. Interval progression of multiple pulmonary nodules likely metastatic disease due to patient's known pancreatic cancer. 4. Couple indeterminate low-density foci over the right lobe of the liver with air-fluid levels of not well evaluated on this arterial phase contrast CT. Pneumobilia and  partially visualized biliary stent. Recommend clinical correlation as further evaluation with venous phase contrast enhanced CT of the abdomen/pelvis would be helpful. Critical Value/emergent results were called by telephone at the time of interpretation on 12/10/2019 at 3:08 pm to provider [facility] RAY , who verbally acknowledged these results. Electronically Signed   By: Marin Olp M.D.   On: 12/24/2019 15:10   DG Chest Port 1 View  Result Date: 12/31/2019 CLINICAL DATA:  Pancreatic cancer, weakness EXAM: PORTABLE CHEST 1 VIEW COMPARISON:  08/29/2019 chest CT. FINDINGS: Right internal jugular Port-A-Cath terminates at the cavoatrial junction. Normal heart size. Normal mediastinal contour. No pneumothorax. New small right pleural effusion. No left pleural effusion. No pulmonary edema. Hazy patchy right lung base opacity. Indistinct tiny nodular opacities scattered in the lower lungs bilaterally. IMPRESSION: 1. New small right pleural effusion. 2. Hazy patchy right lung base opacity, favor atelectasis. 3. Indistinct tiny nodular opacities scattered in the lower lungs bilaterally, as seen on prior chest CT, cannot exclude metastatic disease. Electronically Signed   By: Ilona Sorrel M.D.   On: 01/01/2020 11:40    ASSESSMENT AND PLAN: 1. Pancreas cancer  11/29/2018 CT abdomen/pelvis without contrast-appendix not well visualized, limited evaluation due to lack of enteric contrast and perineal fat  12/15/2018 CT abdomen/pelvis with contrast-possible 1.5 cm mass within the mid aspect of the pancreatic body with associated atrophy of the upstream pancreatic tail and associated pancreatic ductal dilatation. Soft tissue fullness around the superior mesenteric artery and celiac axis. Tree-in-bud nodularity within the peripheral aspect of the lower lobes bilaterally.  12/28/2018 upper EUS-2.4 cm x 1.9 cm mass in the body of  the pancreas causing main pancreatic duct obstruction and clearly involving the celiac  trunk. Heterogeneous soft tissue mass in the neck measuring 3.6 cm. Fine-needle aspiration pancreas with malignant cells consistent with adenocarcinoma.  01/16/2019 Port-A-Cath placement  01/17/2019 PET scan-suspected enlargement of the pancreatic mass with abnormal hypermetabolic activity at the junction of the pancreatic body and tail measuring 3.4 x 2.9 x 2.4 cm, thought to be a high likelihood tumor extension around the vicinity of the celiac trunk and possibly the superior mesenteric artery. Potential mild left periaortic adenopathy maximum SUV of the indistinct left periaortic lymph nodes approximately 3.3 which is above blood pool levels. Scattered groundglass density nodules in the lungs favoring the upper lobes with tree-in-bud nodularity in the lung bases favoring the lower lobes. None of the lesions are hypermetabolic.  Gemcitabine/Abraxane 01/24/2019-08/22/2019  04/16/2019 CT chest/abdomen/pelvis-margins of pancreatic neoplasm difficult to identify. Similar appearance of soft tissue infiltration within the retroperitoneum with progressive narrowing of the portal venous confluence by tumor. No specific findings of solid organ metastasis or nodal metastasis within the abdomen or pelvis. Unchanged appearance of multifocal subsolid nodules within the upper lung zones and bilateral lower lung zone tree-in-bud nodularity. Right lobe of thyroid gland nodule.  This  CT 08/29/2019-development of intra and extrahepatic biliary duct dilatation with abrupt cut off at the common bile duct, abnormal soft tissue in the retroperitoneum attenuates the portal vein and the splenic vein is occluded, fullness in the head of the pancreas appears more prominent without discrete margins, subtle 12 mm focus in the medial left liver   MRI liver 09/20/2019-heterogeneous hepatic hyperenhancement favored to represent perfusion anomalies in the setting of portal vein narrowing.  No convincing evidence of hepatic  metastasis.  Locally advanced pancreatic carcinoma suboptimally evaluated.  Persistent mild intrahepatic biliary duct dilatation.  Subtle peripancreatic edema. 2. Abdominal/back pain secondary to #1 3. Port-A-Cath placement 01/16/2019  Mr. Sagan is been admitted with sepsis and is in [place]. Sepsis likely related to biliary stent versus urosepsis. He is currently receiving IV antibiotics and platelets.  CBC shows mild anemia with significant thrombocytopenia.  He also has leukocytosis.  DIC panel from yesterday shows a low fibrinogen of less than 60 and elevated INR and PTT.  CTA chest shows continued disease progression of his pancreatic cancer. Results have been discussed with the patient and his family member. Goals of care discussed with the patient and we have recommended a hospice and palliative care referral. The patient continues to want aggressive treatment and is not interested in hospice at this time. He is currently not a candidate for chemotherapy due to his continued decline in performance status.  Recommendations: 1. Transfuse platelets for platelet count less than 10,000. 2. Begin vitamin K 10 mg subcu every 12 hours x 3 doses. 3. Give cryoprecipitate for fibrinogen less than 100. 4. Repeat DIC panel in the morning. 5. Recommend palliative care consult for goals of care discussion. 6.  Hold anticoagulation therapy and filter placement for now 7.  Continue narcotic analgesics for pain   LOS: 1 day   Mikey Bussing, DNP, AGPCNP-BC, AOCNP 12/24/19 Mr. Schmieder was interviewed and examined.  His fiance was at the bedside.  He is known to me with a history of locally advanced pancreas cancer.  He was admitted yesterday with generalized weakness.  He was noted to have leukocytosis and severe thrombocytopenia.  The fibrinogen level is low.  I suspect he has bacterial sepsis, likely from a biliary or urinary source.  He is being  treated with broad-spectrum antibiotics.  The chest CT  yester day revealed a single small pulmonary embolism, a right pleural effusion, and pulmonary nodules.  I suspect he has metastatic disease in the chest.  I discussed the clinical findings with Mr. Ohern and his fiance.  He will not be a candidate for SBRT to the pancreas if he has metastatic disease.  We discussed comfort care versus a trial of salvage systemic therapy.  He would like to remain on a full CODE STATUS.  He will not be a candidate for systemic therapy unless he has significant improvement in his performance status.

## 2019-12-24 NOTE — ED Notes (Signed)
Pt CBG found to be 62, pt NPO, given 12.5gm D50IV per protocol. MD notified about CBG. New orders to follow.

## 2019-12-24 NOTE — Plan of Care (Signed)
°  Problem: Clinical Measurements: Goal: Will remain free from infection Outcome: Progressing Goal: Diagnostic test results will improve Outcome: Progressing Goal: Respiratory complications will improve Outcome: Progressing Goal: Cardiovascular complication will be avoided Outcome: Progressing   Problem: Education: Goal: Knowledge of General Education information will improve Description: Including pain rating scale, medication(s)/side effects and non-pharmacologic comfort measures Outcome: Progressing   

## 2019-12-25 ENCOUNTER — Inpatient Hospital Stay (HOSPITAL_COMMUNITY): Payer: Medicaid Other

## 2019-12-25 LAB — PREPARE CRYOPRECIPITATE
Unit division: 0
Unit division: 0

## 2019-12-25 LAB — CBC
HCT: 30.1 % — ABNORMAL LOW (ref 39.0–52.0)
Hemoglobin: 10.5 g/dL — ABNORMAL LOW (ref 13.0–17.0)
MCH: 28.3 pg (ref 26.0–34.0)
MCHC: 34.9 g/dL (ref 30.0–36.0)
MCV: 81.1 fL (ref 80.0–100.0)
Platelets: 9 10*3/uL — CL (ref 150–400)
RBC: 3.71 MIL/uL — ABNORMAL LOW (ref 4.22–5.81)
RDW: 14.3 % (ref 11.5–15.5)
WBC: 14.2 10*3/uL — ABNORMAL HIGH (ref 4.0–10.5)
nRBC: 0 % (ref 0.0–0.2)

## 2019-12-25 LAB — BPAM CRYOPRECIPITATE
Blood Product Expiration Date: 202109202057
Blood Product Expiration Date: 202109202057
ISSUE DATE / TIME: 202109201544
ISSUE DATE / TIME: 202109201731
Unit Type and Rh: 5100
Unit Type and Rh: 5100

## 2019-12-25 LAB — CBC WITH DIFFERENTIAL/PLATELET
Abs Immature Granulocytes: 0.11 10*3/uL — ABNORMAL HIGH (ref 0.00–0.07)
Basophils Absolute: 0 10*3/uL (ref 0.0–0.1)
Basophils Relative: 0 %
Eosinophils Absolute: 0 10*3/uL (ref 0.0–0.5)
Eosinophils Relative: 0 %
HCT: 31.4 % — ABNORMAL LOW (ref 39.0–52.0)
Hemoglobin: 10.7 g/dL — ABNORMAL LOW (ref 13.0–17.0)
Immature Granulocytes: 1 %
Lymphocytes Relative: 7 %
Lymphs Abs: 0.9 10*3/uL (ref 0.7–4.0)
MCH: 27.9 pg (ref 26.0–34.0)
MCHC: 34.1 g/dL (ref 30.0–36.0)
MCV: 82 fL (ref 80.0–100.0)
Monocytes Absolute: 0.4 10*3/uL (ref 0.1–1.0)
Monocytes Relative: 3 %
Neutro Abs: 11.8 10*3/uL — ABNORMAL HIGH (ref 1.7–7.7)
Neutrophils Relative %: 89 %
Platelets: 22 10*3/uL — CL (ref 150–400)
RBC: 3.83 MIL/uL — ABNORMAL LOW (ref 4.22–5.81)
RDW: 14.6 % (ref 11.5–15.5)
WBC: 13.1 10*3/uL — ABNORMAL HIGH (ref 4.0–10.5)
nRBC: 0 % (ref 0.0–0.2)

## 2019-12-25 LAB — HEPATIC FUNCTION PANEL
ALT: 49 U/L — ABNORMAL HIGH (ref 0–44)
AST: 44 U/L — ABNORMAL HIGH (ref 15–41)
Albumin: 2 g/dL — ABNORMAL LOW (ref 3.5–5.0)
Alkaline Phosphatase: 469 U/L — ABNORMAL HIGH (ref 38–126)
Bilirubin, Direct: 1.1 mg/dL — ABNORMAL HIGH (ref 0.0–0.2)
Indirect Bilirubin: 2.2 mg/dL — ABNORMAL HIGH (ref 0.3–0.9)
Total Bilirubin: 3.3 mg/dL — ABNORMAL HIGH (ref 0.3–1.2)
Total Protein: 6 g/dL — ABNORMAL LOW (ref 6.5–8.1)

## 2019-12-25 LAB — URINE CULTURE: Culture: 80000 — AB

## 2019-12-25 LAB — DIC (DISSEMINATED INTRAVASCULAR COAGULATION)PANEL
D-Dimer, Quant: 13.33 ug/mL-FEU — ABNORMAL HIGH (ref 0.00–0.50)
Fibrinogen: 399 mg/dL (ref 210–475)
INR: 1.6 — ABNORMAL HIGH (ref 0.8–1.2)
Platelets: 8 10*3/uL — CL (ref 150–400)
Prothrombin Time: 18.6 seconds — ABNORMAL HIGH (ref 11.4–15.2)
Smear Review: NONE SEEN
aPTT: 38 seconds — ABNORMAL HIGH (ref 24–36)

## 2019-12-25 MED ORDER — FUROSEMIDE 10 MG/ML IJ SOLN
20.0000 mg | Freq: Once | INTRAMUSCULAR | Status: AC
  Administered 2019-12-25: 20 mg via INTRAVENOUS
  Filled 2019-12-25: qty 2

## 2019-12-25 MED ORDER — PROSOURCE PLUS PO LIQD
30.0000 mL | Freq: Three times a day (TID) | ORAL | Status: DC
  Administered 2019-12-25 – 2019-12-26 (×3): 30 mL via ORAL
  Administered 2019-12-26: 10 mL via ORAL
  Administered 2019-12-29 – 2019-12-30 (×2): 30 mL via ORAL
  Filled 2019-12-25 (×12): qty 30

## 2019-12-25 MED ORDER — SODIUM CHLORIDE 0.9% IV SOLUTION: Freq: Once | INTRAVENOUS | Status: AC

## 2019-12-25 MED ORDER — BOOST / RESOURCE BREEZE PO LIQD CUSTOM
1.0000 | Freq: Three times a day (TID) | ORAL | Status: DC
  Administered 2019-12-25 – 2019-12-30 (×6): 1 via ORAL

## 2019-12-25 MED ORDER — ADULT MULTIVITAMIN W/MINERALS CH
1.0000 | ORAL_TABLET | Freq: Every day | ORAL | Status: DC
  Administered 2019-12-25 – 2020-01-03 (×5): 1 via ORAL
  Filled 2019-12-25 (×8): qty 1

## 2019-12-25 NOTE — Consult Note (Signed)
Palliative Care Consult Note  Reason for consult: Goals of care in light of metastatic pancreatic cancer  Palliative care consult received.  Chart reviewed including personal review of pertinent labs and imaging.  Briefly, Mr. Payne is a 39 year old male with PMHx metastatic pancreatic cancer on palliative chemotherapy with plan for radiation who was admitted with decreased appetite and weakness.  CT revealed subsegmental PE, moderate right effusion, liver abscesses, and metastatic disease in lower lungs.  Palliative consulted for goals of care.    I met today with Mr. Loughner, his fiancee, and his mother.  I introduced palliative care as specialized medical care for people living with serious illness. It focuses on providing relief from the symptoms and stress of a serious illness. The goal is to improve quality of life for both the patient and the family.  I asked Mr. Vroom to tell me more about himself and things important to him.  He was slow to engage in conversation, but stated after some time that spending time with family is were he finds his joy.  I then asked him to tell me more about his understanding of his medical condition.  He did not really provide answer.  His fiancee became upset as she felt open ended questions regarding what he has been told were indicative that I had not reviewed his case nor did I have an understanding of his situation.  Explained that my goals with these questions is to ascertain what Mr. Visser understood of his situation, and that I had reviewed his record and discussed his care with oncology NP as well as primary attending.   Attempted to review his clinical situation including findings of liver abscesses and metastasis to lungs on CT this afternoon.  His fiancee expressed feeling that it was inappropriate for new information to come from someone other than oncology, GI, or primary attending.  Attempted to discuss that we function as a team in  the hospital and that I had only come per request of other physicians caring for Mr. Madariaga with a goal of providing support in light of his advanced cancer.  Following this, we came to agreement to await input from GI regarding liver abscesses and further thoughts from Dr. Benay Spice prior to further discussion with palliative care team.  - Full code/ Full scope  - Will continue to support Mr. Kincer as best possible, but family expressed preference for information and discussion of goals and care plan to be driven by someone other than palliative care team.    - Plan for follow-up in a day or two to attempt continued building of rapport and support for patient and family.  Total time: 45 minutes Greater than 50%  of this time was spent counseling and coordinating care related to the above assessment and plan.  Micheline Rough, MD [facility] [phone]

## 2019-12-25 NOTE — Progress Notes (Signed)
Report received from I. Oraegbunam,RN.  No change in assessment, continue plan of care. Jansel Vonstein, Laurel Dimmer, RN

## 2019-12-25 NOTE — Progress Notes (Addendum)
PROGRESS NOTE    Gabriel [name]  IWO:[id number]  DOB: 04-23-1980  PCP: Kerin Perna, NP Admit date:12/13/2019 Chief compliant: generalized weakness, poor oral intake 39 year old male with a history of metastatic pancreatic cancer on palliative chemotherapy and scheduled for radiation therapy who presents today with decreased appetite and generalized weakness for the past 3 days. Unable to provide history with AMS but per fiance no c/o dysuria, fever or chills. ED Course: Afebrileand hemodynamically stable on room air.  Notable labs: Glucose 48, BUN 51, creatinine 0.59,ALP 471, AST 62, ALT 61, WBC 38K with left shift, PLT 11, INR 1.8, UA positive for UTI.  COVID-19 negative.  CXR with new small right pleural effusion, hazy patchy right lung base atelectasis known bilateral nodules.  CTA chest: Right lower lobe subsegmental PE, moderate right-sided pleural effusion with atelectasis, right lobe of the liver low-density foci with air-fluid levels however not well evaluated on this CT and recommended venous phase contrast-enhanced CT of the abdomen and pelvis.  He received broad-spectrum antibiotics with vancomycin, aztreonam, metronidazole, 1 L NS bolus, 1/2 amp D50, and fentanyl. Hospital course: Patient admitted to [facility] for further management of Sepsis with metabolic encephalopathy,severe thrombocytopenia,  failure to thrive and new PE  Started on meropenem to cover cholangitis/?hepatic abscess and UTI. Seen by Hematology/Oncology --platelet transfusion , start anticoagulation when PLT >50.   Subjective: Patient sitting up in bed and states feels short of breath when lays down.  Abdominal pain currently controlled with pain medication.  Denies nose bleeds, hematochezia or melena or hematemesis.  Mother at bedside Objective: Vitals:   12/25/19 0505 12/25/19 1042 12/25/19 1117 12/25/19 1311  BP: 110/81 121/87 (!) 119/91 129/90  Pulse: (!) 53 (!) 55 (!) 55 (!) 57  Resp: 17 16 12 14   Temp:  97.7 F (36.5 C) (!) 97.3 F (36.3 C) (!) 97.4 F (36.3 C) (!) 97.4 F (36.3 C)  TempSrc: Oral Oral Axillary Axillary  SpO2: 100% 100% 100% 100%  Weight:      Height:        Intake/Output Summary (Last 24 hours) at 12/25/2019 1724 Last data filed at 12/25/2019 1400 Gross per 24 hour  Intake 2736.41 ml  Output 1075 ml  Net 1661.41 ml   Filed Weights   12/24/19 1440  Weight: 39.6 kg    Physical Examination:  General:  Cachectic male with bitemporal wasting, mild distress due to pain Head ENT: Atraumatic normocephalic, PERRLA, neck supple Heart: S1-S2 heard, regular rate and rhythm, no murmurs.  No leg edema noted Lungs: Decreased breath sounds at bases right greater than left, no rhonchi or rales on exam, no accessory muscle use Abdomen: Bowel sounds heard, soft, mild diffuse tenderness with some guarding, no rebound. Extremities: No pedal edema.  No cyanosis or clubbing but with muscular wasting. Neurological: Awake alert oriented x3, generalized weakness, no focal weakness or numbness Skin: No wounds or rashes.   Data Reviewed: I have personally reviewed following labs and imaging studies  CBC: Recent Labs  Lab 01/02/2020 1036 12/07/2019 1036 12/17/2019 1708 12/26/2019 1925 12/24/19 0420 12/25/19 0458 12/25/19 1353  WBC 38.7*  --   --  14.2* 25.1* 14.2* 13.1*  NEUTROABS 36.2*  --   --  13.5*  --   --  11.8*  HGB 14.1  --   --  12.8* 12.6* 10.5* 10.7*  HCT 41.4  --   --  37.9* 37.1* 30.1* 31.4*  MCV 82.1  --   --  81.9 82.1 81.1 82.0  PLT 11*   < > 6* 7* 8* 8*  9* 22*   < > = values in this interval not displayed.   Basic Metabolic Panel: Recent Labs  Lab 12/15/2019 1036 12/08/2019 1708 12/24/19 0420  NA 136  --  137  K 4.8  --  4.1  CL 97*  --  101  CO2 26  --  26  GLUCOSE 48*  --  79  BUN 51*  --  33*  CREATININE 0.59*  --  0.42*  CALCIUM 8.1*  --  7.8*  PHOS  --  3.4  --    GFR: Estimated Creatinine Clearance: 69.4 mL/min (A) (by C-G formula based on SCr  of 0.42 mg/dL (L)). Liver Function Tests: Recent Labs  Lab 12/31/2019 1036  AST 62*  ALT 61*  ALKPHOS 471*  BILITOT 3.2*  PROT 7.0  ALBUMIN 2.1*   No results for input(s): LIPASE, AMYLASE in the last 168 hours. No results for input(s): AMMONIA in the last 168 hours. Coagulation Profile: Recent Labs  Lab 12/06/2019 1036 12/31/2019 1708 12/24/19 0420 12/25/19 0458  INR 1.8* 1.7* 1.9* 1.6*   Cardiac Enzymes: No results for input(s): CKTOTAL, CKMB, CKMBINDEX, TROPONINI in the last 168 hours. BNP (last 3 results) No results for input(s): PROBNP in the last 8760 hours. HbA1C: No results for input(s): HGBA1C in the last 72 hours. CBG: Recent Labs  Lab 12/22/2019 2017 12/24/19 0028 12/24/19 0803 12/24/19 0855 12/24/19 1135  GLUCAP 94 97 62* 137* 90   Lipid Profile: No results for input(s): CHOL, HDL, LDLCALC, TRIG, CHOLHDL, LDLDIRECT in the last 72 hours. Thyroid Function Tests: No results for input(s): TSH, T4TOTAL, FREET4, T3FREE, THYROIDAB in the last 72 hours. Anemia Panel: No results for input(s): VITAMINB12, FOLATE, FERRITIN, TIBC, IRON, RETICCTPCT in the last 72 hours. Sepsis Labs: Recent Labs  Lab 12/16/2019 1236 12/24/19 0420  PROCALCITON  --  119.44  LATICACIDVEN 1.5  --     Recent Results (from the past 240 hour(s))  SARS Coronavirus 2 by RT PCR (hospital order, performed in [facility] hospital lab) Nasopharyngeal Nasopharyngeal Swab     Status: None   Collection Time: 12/11/2019 10:36 AM   Specimen: Nasopharyngeal Swab  Result Value Ref Range Status   SARS Coronavirus 2 NEGATIVE NEGATIVE Final    Comment: (NOTE) SARS-CoV-2 target nucleic acids are NOT DETECTED.  The SARS-CoV-2 RNA is generally detectable in upper and lower respiratory specimens during the acute phase of infection. The lowest concentration of SARS-CoV-2 viral copies this assay can detect is 250 copies / mL. A negative result does not preclude SARS-CoV-2 infection and should not be used as  the sole basis for treatment or other patient management decisions.  A negative result may occur with improper specimen collection / handling, submission of specimen other than nasopharyngeal swab, presence of viral mutation(s) within the areas targeted by this assay, and inadequate number of viral copies (<250 copies / mL). A negative result must be combined with clinical observations, patient history, and epidemiological information.  Fact Sheet for Patients:   StrictlyIdeas.no  Fact Sheet for Healthcare Providers: BankingDealers.co.za  This test is not yet approved or  cleared by the [place] FDA and has been authorized for detection and/or diagnosis of SARS-CoV-2 by FDA under an Emergency Use Authorization (EUA).  This EUA will remain in effect (meaning this test can be used) for the duration of the COVID-19 declaration under Section 564(b)(1) of the Act, 21 U.S.C. section 360bbb-3(b)(1), unless the authorization  is terminated or revoked sooner.  Performed at [facility], [place] [address] [place], [place] [address]   Urine culture     Status: Abnormal   Collection Time: 12/09/2019 11:03 AM   Specimen: In/Out Cath Urine  Result Value Ref Range Status   Specimen Description   Final    IN/OUT CATH URINE Performed at [place] [address], [place], [place] [address]    Special Requests   Final    NONE Performed at [facility], [place] [address] [place], [place] [address]    Culture 80,000 COLONIES/mL KLEBSIELLA SPECIES (A)  Final   Report Status 12/25/2019 FINAL  Final   Organism ID, Bacteria KLEBSIELLA SPECIES (A)  Final      Susceptibility   Klebsiella species - MIC*    AMPICILLIN RESISTANT Resistant     CEFAZOLIN <=4 SENSITIVE Sensitive     CEFTRIAXONE <=0.25 SENSITIVE Sensitive     CIPROFLOXACIN <=0.25 SENSITIVE Sensitive     GENTAMICIN <=1 SENSITIVE  Sensitive     IMIPENEM <=0.25 SENSITIVE Sensitive     NITROFURANTOIN 32 SENSITIVE Sensitive     TRIMETH/SULFA <=20 SENSITIVE Sensitive     AMPICILLIN/SULBACTAM 4 SENSITIVE Sensitive     PIP/TAZO 8 SENSITIVE Sensitive     * 80,000 COLONIES/mL KLEBSIELLA SPECIES      Radiology Studies: CT ABDOMEN PELVIS W CONTRAST  Result Date: 12/25/2019 CLINICAL DATA:  39 year old male with history of abdominal pain. Suspected abscess. EXAM: CT ABDOMEN AND PELVIS WITH CONTRAST TECHNIQUE: Multidetector CT imaging of the abdomen and pelvis was performed using the standard protocol following bolus administration of intravenous contrast. CONTRAST:  2mL OMNIPAQUE IOHEXOL 300 MG/ML  SOLN COMPARISON:  CT the abdomen and pelvis 08/29/2019. FINDINGS: Lower chest: Moderate right and small left pleural effusions lying dependently. Some pleural enhancement and thickening in the lower right hemithorax suggestive of malignant pleural involvement. Numerous pulmonary nodules scattered throughout the visualize lung bases, including areas of confluent nodularity in the lower lobes of the lungs bilaterally, most compatible with progressive metastatic disease. Hepatobiliary: Compared to the prior study there are numerous low-attenuation lesions within the liver, highly suspicious for multifocal intrahepatic abscesses (less likely to represent centrally necrotic metastatic lesions). The largest of these has internal septations and measures up to 3.6 x 3.5 cm in segment 5 (axial image 30 of series 2). One of these lesions in segment 7/8 of the liver has some non dependent gas, strongly favoring abscess. Extensive intrahepatic biliary ductal dilatation with a large volume of pneumobilia. Gallbladder is unremarkable in appearance. Interval placement of common bile duct stent which appears appropriately located. Pancreas: Poorly defined pancreatic mass in the superior aspect of the head of the pancreas (soft tissues in the superior aspect of  the head of the pancreas are somewhat indistinct, potentially treatment related). Haziness in the peripancreatic fat, including soft tissue around portions of the celiac axis, common hepatic artery and proximal superior mesenteric artery, as well as the left renal vein, all of which appear narrowed, likely indicative of locally infiltrative tumor. This lesion is difficult to definitively identify and measure, but is estimated to measure approximately 4.4 x 3.0 cm (axial image 27 of series 2). Spleen: The appearance of the spleen is unremarkable. Adrenals/Urinary Tract: Bilateral kidneys and adrenal glands are normal in appearance. No hydroureteronephrosis. Urinary bladder is unremarkable in appearance. Stomach/Bowel: The appearance of the stomach is unremarkable. No pathologic dilatation of small bowel or colon. Portions of the colon appear  thickened with some mucosal hyperenhancement (poorly evaluated given the adjacent ascites), best appreciated in the region of the cecum, ascending colon and hepatic flexure, suggestive of underlying colitis. Appendix is not confidently identified. Vascular/Lymphatic: Aortic atherosclerosis, without evidence of aneurysm or dissection in the abdominal or pelvic vasculature. Vascular involvement from the pancreatic mass, discussed above. Poorly defined infiltrative soft tissue throughout the retroperitoneum adjacent to the pancreatic mass, with soft tissue completely encircling the upper abdominal aorta immediately below the level of the renal arteries (axial image 30 of series 2), presumably indicative of lymphatic spread of disease. Reproductive: Prostate gland and seminal vesicles are unremarkable in appearance. Other: Large volume of ascites. No pneumoperitoneum. Diffuse body wall edema. Musculoskeletal: There are no aggressive appearing lytic or blastic lesions noted in the visualized portions of the skeleton. IMPRESSION: 1. Today's study demonstrates what appears to be  progression of disease with innumerable metastatic lesions throughout the visualize lung bases, probable malignant moderate right pleural effusion and small left pleural effusion, enlargement of the primary pancreatic mass now with vascular involvement, and what appears to be evolving retroperitoneal lymphadenopathy encasing the abdominal aorta. 2. In addition, there has been interval placement of a common bile duct stent, now with substantial intrahepatic biliary ductal dilatation, large amount of pneumatosis, and multiple new low-attenuation liver lesions, some of which have internal gas fluid levels, highly concerning for multiple intra-abdominal abscesses and probable cholangitis. 3. Profound mural thickening and mucosal hyperenhancement in portions of the colon (predominantly cecum, ascending colon and hepatic flexure), concerning for colitis. 4. Aortic atherosclerosis. 5. Additional incidental findings, as above. Electronically Signed   By: Vinnie Langton M.D.   On: 12/25/2019 14:50      Scheduled Meds: . (feeding supplement) PROSource Plus  30 mL Oral TID BM  . Chlorhexidine Gluconate Cloth  6 each Topical Daily  . feeding supplement  1 Container Oral TID BM  . morphine  60 mg Oral Q12H  . multivitamin with minerals  1 tablet Oral Daily  . nystatin  5 mL Oral QID  . sodium chloride flush  3 mL Intravenous Q12H   Continuous Infusions: . dextrose 5 % and 0.9% NaCl 60 mL/hr at 12/25/19 1339  . meropenem (MERREM) IV 1 g (12/25/19 1337)      Assessment/Plan:  1.Sepsis with metabolic encephalopathy : POA. UTI vs cholangitis vs hepatic abscess. SOFA Score > 2 (Platelets <20 [4pts], T bili 3.2 [2 pts], GCS 14 [1 pt]) with infectious source.  Afebrile, tachycardic, leukocytosis (WBC 38->14->25k) . Follow blood cultures, urine cx results. Procalcitonin 119.4.   Continue IV meropenem.  CT abdomen with contrast shows multiple liver abscesses and cholangitis/colitis.  Also reported is significantly  dilated biliary duct about the stent indicating possible stent failure.  Consulted GI Dr Benson [place].   Leucocytosis secondary to infection, probably haem concentrated initially vs sec to malignancy.  White count down from 20 5K->13 K today.  2.Severe thrombocytopenia: Platelet count dropped from 216 in July->11 now->6. No schistocytes.  Could be related to sepsis.  Seen by Hematology, recommended platelet transfusion to keep count >50. Avoid anticoagulants until then.  Received 1 unit of platelet transfusion yesterday and ordered another unit today.  Repeat CBC in a.m.  3. Pulmonary Embolism: Small, peripheral and without hemodynamic compromise. Fortunately not hypoxic either. No choice but to hold anticoagulation with severe thrombocytopenia. Per Hematology , prophylactic dose of heparin can be started if and when PLT >50 and see if tolerates before escalating to full dose.  4.Right Pleural  effusion: He has small-moderate right pleural effusion - diagnostic tap/cytology can be considered if and when PLT counts improve.  Per CT, could be malignant pleural effusion.  Cautious IV hydration  5. Locally advanced Pancreatic ca, Pulmonary mets, : Patient with severe abdominal pain/nausea and transaminatis/hyperbilirubinemia limiting oral intake causing hypoglycemia. He does have biliary stent in place.  Elevated LFTs could be secondary to cholangitis/stent failure/new stricture.  Contrasted CT today reported malignant moderate right pleural effusion and small left pleural effusion, enlargement of the primary pancreatic mass now with vascular involvement, and what appears to be evolving retroperitoneal lymphadenopathy encasing the abdominal aorta.  Of note, MRI in June did not show hepatic metastasis but did show locally advanced pancreatic CA with mild intrahepatic biliary dilatation.  6. Failure to thrive, poor oral intake, hypoglycemia: In the setting of problem #1 and problem #5.  Renewed dextrose fluids, normal  cortisol level.  Seen by speech therapy and on clear liquid diet.  Needs goals of care discussion given grave prognosis-difficult decision making process anticipated with young age and limited Rx options. Oncology following along.  Discussed with Dr. Domingo Cocking with palliative care who will evaluate patient today.    DVT prophylaxis: SCD Code Status: Full code as of now.  Family / Patient Communication: Significant other not present at bedside but discussed with mother who is at bedside. Disposition Plan:   Status is: Inpatient  Remains inpatient appropriate because:Ongoing diagnostic testing needed not appropriate for outpatient work up, IV treatments appropriate due to intensity of illness or inability to take PO and Inpatient level of care appropriate due to severity of illness   Dispo: The patient is from: Home              Anticipated d/c is to: TBD              Anticipated d/c date is: 3 days              Patient currently is not medically stable to d/c.     Time spent: 35 MINS     >50% time spent in discussions with care team and coordination of care.    Guilford Shi, MD [facility] Pager in [place]  If 7PM-7AM, please contact night-coverage [email or web address] 12/25/2019, 5:24 PM

## 2019-12-25 NOTE — Progress Notes (Addendum)
HEMATOLOGY-ONCOLOGY PROGRESS NOTE  SUBJECTIVE: Has no complaints this morning. No bleeding reported. Family member reports that he is drinking fluids, but not eating much.   Oncology History  Malignant neoplasm of head of pancreas [facility])   Initial Diagnosis   Pancreatic adenocarcinoma ([place])   12/15/2018 Imaging   CT abdomen/pelvis w/ contrast: IMPRESSION: 1. Suggestion of a 1.5 cm mass within the mid aspect of the pancreatic body with associated atrophy of the upstream pancreatic tail and associated pancreatic ductal dilatation. Recommend further evaluation with MRI. There is soft tissue fullness around the superior mesenteric artery and celiac axis. This may represent edema or potentially tumor involvement. 2. Tree-in-bud nodularity within the peripheral aspect of the lower lobes bilaterally which may be secondary to an infectious or inflammatory process. Recommend follow-up chest CT in 3 months to assess for interval change. 3. These results will be called to the ordering clinician or representative by the Radiologist Assistant, and communication documented in the PACS or zVision Dashboard.   12/29/2018 Procedure   EUS: -2.4 x 1.9cm mass in the body of the pancreas causing the main pancreatic duct obstruction and clearly involving the celiac trunk. Biopsied. -Heterogenous soft tissue mass in the neck, measuring 3.6cm maximally. This was not sampled, presumed to be a large thyroid gland.  -No peripancreatic adenopathy -CBD was normal, non-dilated.   12/29/2018 Pathology Results   CASE: [id number]   DIAGNOSIS:  - Malignant cells consistent with adenocarcinoma  - See comment.   01/17/2019 Imaging   PET: IMPRESSION: 1. Suspected enlargement of the pancreatic mass, with abnormal hypermetabolic activity at the junction of the pancreatic body and tail currently measuring 3.4 by 2.9 by 2.4 cm. Although anatomic localization is hampered by the patient's extreme paucity of  adipose tissue (making separation of adjacent structures difficult on noncontrast CT), there is thought to be a high likelihood tumor extending around the vicinity of the celiac trunk and possibly the superior mesenteric artery. In this case, MRI might provide better anatomic localization with respect to perivascular involvement. 2. Potential mild left periaortic adenopathy, maximum SUV of the indistinct left periaortic lymph nodes is approximately 3.3 which is above blood pool levels. 3. Scattered ground-glass density nodules in the lungs favoring the upper lobes, with tree-in-bud nodularity in the lung bases favoring the lower lobes. None of these lesions are hypermetabolic. While possibilities include atypical infectious process drug reaction, surveillance of the chest is recommended to exclude low-grade adenocarcinoma. 4.  Aortic Atherosclerosis (ICD10-I70.0).   01/24/2019 -  Chemotherapy   The patient had PACLitaxel-protein bound (ABRAXANE) chemo infusion 150 mg, 100 mg/m2 = 150 mg (100 % of original dose 100 mg/m2), Intravenous,  Once, 6 of 8 cycles Dose modification: 100 mg/m2 (original dose 100 mg/m2, Cycle 1, Reason: Provider Judgment) Administration: 150 mg (01/24/2019), 150 mg (02/14/2019), 150 mg (03/02/2019), 150 mg (03/16/2019), 150 mg (04/26/2019), 150 mg (07/04/2019), 150 mg (07/18/2019), 150 mg (05/09/2019), 150 mg (05/23/2019), 150 mg (08/08/2019), 150 mg (08/22/2019), 150 mg (06/22/2019) gemcitabine (GEMZAR) 1,102 mg in sodium chloride 0.9 % 250 mL chemo infusion, 800 mg/m2 = 1,102 mg (100 % of original dose 800 mg/m2), Intravenous,  Once, 6 of 8 cycles Dose modification: 800 mg/m2 (original dose 800 mg/m2, Cycle 1, Reason: Provider Judgment), 1,000 mg/m2 (original dose 800 mg/m2, Cycle 5, Reason: Provider Judgment) Administration: 1,102 mg (01/24/2019), 1,102 mg (02/14/2019), 1,102 mg (03/02/2019), 1,102 mg (03/16/2019), 1,102 mg (04/26/2019), 1,406 mg (07/04/2019), 1,406 mg  (07/18/2019), 1,406 mg (05/09/2019), 1,406 mg (05/23/2019), 1,406 mg (08/08/2019), 1,406  mg (08/22/2019), 1,406 mg (06/22/2019)  for chemotherapy treatment.    02/28/2019 Cancer Staging   Staging form: Exocrine Pancreas, AJCC 8th Edition - Clinical: Stage IB (cT2, cN0, cM0) - Signed by Tish Men, MD on 02/28/2019   04/16/2019 Imaging   CT CAP: IMPRESSION: 1. Margins of pancreatic neoplasm are difficult to identify. Similar appearance of soft tissue infiltration within the retroperitoneum with progressive narrowing of the portal venous confluence by tumor. No specific findings of solid organ metastasis or nodal metastasis within the abdomen or pelvis. 2. Unchanged appearance of multifocal sub solid nodules within the upper lung zones and bilateral lower lung zone tree-in-bud nodularity. As mentioned previously findings are nonspecific and may be inflammatory or infectious in etiology. Metastatic disease is not excluded. 3. Aortic atherosclerosis. 4. Right lobe of thyroid gland nodule. Consider further evaluation with thyroid ultrasound. If patient is clinically hyperthyroid, consider nuclear medicine thyroid uptake and scan.    PHYSICAL EXAMINATION:  Vitals:   12/25/19 1042 12/25/19 1117  BP: 121/87 (!) 119/91  Pulse: (!) 55 (!) 55  Resp: 16 12  Temp: (!) 97.3 F (36.3 C) (!) 97.4 F (36.3 C)  SpO2: 100% 100%   Filed Weights   12/24/19 1440  Weight: 39.6 kg    Intake/Output from previous day: 09/20 0701 - 09/21 0700 In: 2039.8 [P.O.:90; I.V.:1033.7; Blood:616; IV Piggyback:300.1] Out: 600 [Urine:600]  GENERAL: Alert, cachectic, no distress SKIN: skin color, texture, turgor are normal, no rashes or significant lesions LUNGS: clear to auscultation and percussion with normal breathing effort HEART: regular rate & rhythm and no murmurs and no lower extremity edema ABDOMEN: Soft, no tenderness with palpation NEURO: alert & oriented x 3 with fluent speech, no focal motor/sensory  deficits  LABORATORY DATA:  I have reviewed the data as listed CMP Latest Ref Rng & Units 12/24/2019 12/25/2019 11/30/2019  Glucose 70 - 99 mg/dL 79 48(L) 171(H)  BUN 6 - 20 mg/dL 33(H) 51(H) 7  Creatinine 0.61 - 1.24 mg/dL 0.42(L) 0.59(L) 0.52(L)  Sodium 135 - 145 mmol/L 137 136 139  Potassium 3.5 - 5.1 mmol/L 4.1 4.8 3.7  Chloride 98 - 111 mmol/L 101 97(L) 101  CO2 22 - 32 mmol/L 26 26 27   Calcium 8.9 - 10.3 mg/dL 7.8(L) 8.1(L) 8.9  Total Protein 6.5 - 8.1 g/dL - 7.0 -  Total Bilirubin 0.3 - 1.2 mg/dL - 3.2(H) -  Alkaline Phos 38 - 126 U/L - 471(H) -  AST 15 - 41 U/L - 62(H) -  ALT 0 - 44 U/L - 61(H) -    Lab Results  Component Value Date   WBC 14.2 (H) 12/25/2019   HGB 10.5 (L) 12/25/2019   HCT 30.1 (L) 12/25/2019   MCV 81.1 12/25/2019   PLT 8 (LL) 12/25/2019   PLT 9 (LL) 12/25/2019   NEUTROABS 13.5 (H) 12/17/2019    CT Head Wo Contrast  Result Date: 12/15/2019 CLINICAL DATA:  Pancreatic cancer, chemotherapy, weakness, headache EXAM: CT HEAD WITHOUT CONTRAST TECHNIQUE: Contiguous axial images were obtained from the base of the skull through the vertex without intravenous contrast. COMPARISON:  None. FINDINGS: Brain: No evidence of acute infarction, hemorrhage, hydrocephalus, extra-axial collection or mass lesion/mass effect. Vascular: No hyperdense vessel or unexpected calcification. Skull: Normal. Negative for fracture or focal lesion. Sinuses/Orbits: No acute finding. Other: None. IMPRESSION: No acute intracranial pathology. No non-contrast CT evidence of intracranial metastatic disease or findings to explain headache. Contrast enhanced MRI may be used to more sensitively evaluate for intracranial metastatic disease  if desired. Electronically Signed   By: Eddie Candle M.D.   On: 12/07/2019 11:30   CT Angio Chest PE W and/or Wo Contrast  Result Date: 12/10/2019 CLINICAL DATA:  Shortness of breath and chest pain. Suspect pulmonary embolism. History of pancreatic cancer on  chemotherapy. EXAM: CT ANGIOGRAPHY CHEST WITH CONTRAST TECHNIQUE: Multidetector CT imaging of the chest was performed using the standard protocol during bolus administration of intravenous contrast. Multiplanar CT image reconstructions and MIPs were obtained to evaluate the vascular anatomy. CONTRAST:  127mL OMNIPAQUE IOHEXOL 350 MG/ML SOLN COMPARISON:  08/29/2019 FINDINGS: Cardiovascular: Right IJ central venous catheter is present with tip over the cavoatrial junction. Heart is normal size. Thoracic aorta is normal caliber. Pulmonary arterial system is well opacified and demonstrates a single small embolus over a right lower lobar subsegmental pulmonary artery. No left-sided pulmonary emboli. Mediastinum/Nodes: No evidence of mediastinal or hilar adenopathy. Remaining mediastinal structures are unremarkable. Lungs/Pleura: Lungs are adequately inflated demonstrate a moderate size right pleural effusion with associated atelectasis in the right base. There are numerous small bilateral pulmonary nodules with interval progression likely worsening metastatic disease. Airways are unremarkable. Upper Abdomen: Not well evaluated on this arterial phase CT scan. Suggestion of partial visualization of patient's known biliary stent. Moderate pneumobilia is present. Couple low-density foci over the right lobe of the liver with air-fluid levels of uncertain clinical significance. Musculoskeletal: No focal abnormality. Review of the MIP images confirms the above findings. IMPRESSION: 1. Single small embolus over a right lower lobar subsegmental pulmonary artery. 2. Moderate size right pleural effusion with associated right basilar atelectasis. 3. Interval progression of multiple pulmonary nodules likely metastatic disease due to patient's known pancreatic cancer. 4. Couple indeterminate low-density foci over the right lobe of the liver with air-fluid levels of not well evaluated on this arterial phase contrast CT. Pneumobilia and  partially visualized biliary stent. Recommend clinical correlation as further evaluation with venous phase contrast enhanced CT of the abdomen/pelvis would be helpful. Critical Value/emergent results were called by telephone at the time of interpretation on 12/21/2019 at 3:08 pm to provider [facility] RAY , who verbally acknowledged these results. Electronically Signed   By: Marin Olp M.D.   On: 12/06/2019 15:10   DG Chest Port 1 View  Result Date: 12/08/2019 CLINICAL DATA:  Pancreatic cancer, weakness EXAM: PORTABLE CHEST 1 VIEW COMPARISON:  08/29/2019 chest CT. FINDINGS: Right internal jugular Port-A-Cath terminates at the cavoatrial junction. Normal heart size. Normal mediastinal contour. No pneumothorax. New small right pleural effusion. No left pleural effusion. No pulmonary edema. Hazy patchy right lung base opacity. Indistinct tiny nodular opacities scattered in the lower lungs bilaterally. IMPRESSION: 1. New small right pleural effusion. 2. Hazy patchy right lung base opacity, favor atelectasis. 3. Indistinct tiny nodular opacities scattered in the lower lungs bilaterally, as seen on prior chest CT, cannot exclude metastatic disease. Electronically Signed   By: Ilona Sorrel M.D.   On: 12/06/2019 11:40    ASSESSMENT AND PLAN: 1. Pancreas cancer  11/29/2018 CT abdomen/pelvis without contrast-appendix not well visualized, limited evaluation due to lack of enteric contrast and perineal fat  12/15/2018 CT abdomen/pelvis with contrast-possible 1.5 cm mass within the mid aspect of the pancreatic body with associated atrophy of the upstream pancreatic tail and associated pancreatic ductal dilatation. Soft tissue fullness around the superior mesenteric artery and celiac axis. Tree-in-bud nodularity within the peripheral aspect of the lower lobes bilaterally.  12/28/2018 upper EUS-2.4 cm x 1.9 cm mass in the body of the pancreas  causing main pancreatic duct obstruction and clearly involving the celiac  trunk. Heterogeneous soft tissue mass in the neck measuring 3.6 cm. Fine-needle aspiration pancreas with malignant cells consistent with adenocarcinoma.  01/16/2019 Port-A-Cath placement  01/17/2019 PET scan-suspected enlargement of the pancreatic mass with abnormal hypermetabolic activity at the junction of the pancreatic body and tail measuring 3.4 x 2.9 x 2.4 cm, thought to be a high likelihood tumor extension around the vicinity of the celiac trunk and possibly the superior mesenteric artery. Potential mild left periaortic adenopathy maximum SUV of the indistinct left periaortic lymph nodes approximately 3.3 which is above blood pool levels. Scattered groundglass density nodules in the lungs favoring the upper lobes with tree-in-bud nodularity in the lung bases favoring the lower lobes. None of the lesions are hypermetabolic.  Gemcitabine/Abraxane 01/24/2019-08/22/2019  04/16/2019 CT chest/abdomen/pelvis-margins of pancreatic neoplasm difficult to identify. Similar appearance of soft tissue infiltration within the retroperitoneum with progressive narrowing of the portal venous confluence by tumor. No specific findings of solid organ metastasis or nodal metastasis within the abdomen or pelvis. Unchanged appearance of multifocal subsolid nodules within the upper lung zones and bilateral lower lung zone tree-in-bud nodularity. Right lobe of thyroid gland nodule.  This  CT 08/29/2019-development of intra and extrahepatic biliary duct dilatation with abrupt cut off at the common bile duct, abnormal soft tissue in the retroperitoneum attenuates the portal vein and the splenic vein is occluded, fullness in the head of the pancreas appears more prominent without discrete margins, subtle 12 mm focus in the medial left liver   MRI liver 09/20/2019-heterogeneous hepatic hyperenhancement favored to represent perfusion anomalies in the setting of portal vein narrowing.  No convincing evidence of hepatic  metastasis.  Locally advanced pancreatic carcinoma suboptimally evaluated.  Persistent mild intrahepatic biliary duct dilatation.  Subtle peripancreatic edema. 2. Abdominal/back pain secondary to #1 3. Port-A-Cath placement 01/16/2019  Mr. Kelleher appears stable. Sepsis likely related to biliary stent versus urosepsis. He is currently receiving IV antibiotics.  CBC shows mild anemia with significant thrombocytopenia.  Leukocytosis improved.  DIC panel shows normal fibrinogen and still mildly elevated PTT and PT/INR.  Received cryoprecipitate 9/20.  CTA chest shows continued disease progression of his pancreatic cancer, moderate right pleural effusion, and a small PE.  We are currently holding anticoagulation due to thrombocytopenia.  Recommendations: 1. Transfuse platelets for platelet count less than 10,000.  Will give 1 unit today and recheck a repeat platelet count 30 minutes posttransfusion. 2. Recommend palliative care consult for goals of care discussion. 3. Hold anticoagulation therapy and filter placement for now 4.  We will plan for a right thoracentesis when platelet count improves.  Fluid will be sent for cytology. 5.  Continue narcotic analgesics for pain   LOS: 2 days   Mikey Bussing, DNP, AGPCNP-BC, AOCNP 12/25/19  Mr. Guerreiro was interviewed and examined.  He feels better today.  The fibrinogen has improved, but he continues to have severe thrombocytopenia.  He will receive a platelet transfusion today.  He most likely has bacterial sepsis related to the biliary stent.  I recommend contacting GI.  I will add a liver panel today.  The plan is to schedule a diagnostic thoracentesis when the platelet count has improved.  Julieanne Manson, MD

## 2019-12-25 NOTE — Progress Notes (Signed)
Initial Nutrition Assessment  RD working remotely.  DOCUMENTATION CODES:   Underweight  INTERVENTION:  - will order Boost Breeze TID, each supplement provides 250 kcal and 9 grams of protein. - will order 30 mL Prosource Plus TID, each supplement provides 100 kcal and 15 grams of protein. - will order 1 tablet multivitamin with minerals/day. - will complete NFPE when feasible.   NUTRITION DIAGNOSIS:   Increased nutrient needs related to acute illness, cancer and cancer related treatments, chronic illness as evidenced by estimated needs.  GOAL:   Patient will meet greater than or equal to 90% of their needs  MONITOR:   PO intake, Supplement acceptance, Diet advancement  REASON FOR ASSESSMENT:   Malnutrition Screening Tool, Consult Assessment of nutrition requirement/status  ASSESSMENT:   39 year old male with medical history of metastatic pancreatic cancer on palliative chemotherapy and scheduled for radiation therapy. He presented to the ED with decreased appetite, generalized weakness leading to difficulty ambulating, and dyspnea x3 days. In the ED, patient was unable to provide a history today due to his altered mental status.  Diet advanced from NPO to CLD yesterday at 17. The only documented intake since that time was 0% of breakfast this AM. He was seen by [facility] RD on 01/15/19, 01/24/19, 02/14/19, and 05/09/19. As of 2/3, he was drinking 2-3 bottles of Ensure Enlive/day. He was underweight at that time with a weight of 94 lb.   Weight yesterday was 87 lb and weight on 10/23/19 was 91 lb. This indicates 4 lb weight loss (4.4% body weight) in 2 months. Suspect some degree of malnutrition given weight.   SLP saw patient yesterday evening and recommended thin liquids and stated that patient had mild aspiration risk. SLP note indicates patient reported that over the past 1 week at home he had difficulty keeping foods and beverages down.   Medical Onc NP saw  patient this AM and recommended Palliative Care consult for [place] discussion.   Patient is noted to be a/o to self only. RD is working off campus today and did not attempt to call patient d/t noted mentation.   Per notes: - sepsis with metabolic encephalopathy - severe thrombocytopenia - pulmonary embolism  - R pleural effusion - metastatic pancreatic cancer with possible pulmonary mets - FTT   Labs reviewed; CBGs: 62, 137, 90 mg/dl, BUN: 33 mg/dl, creatinine: 0.42 mg/dl, Ca: 7.8 mg/dl. Medications reviewed; 5 ml mycostatin QID, 10 mg subq vitamin K x1 dose 9/21 and x2 doses 9/21.  IVF; D5-NS @ 75 ml/hr (306 kcal).     NUTRITION - FOCUSED PHYSICAL EXAM:  unable to complete at this time.   Diet Order:   Diet Order            Diet clear liquid Room service appropriate? Yes; Fluid consistency: Thin  Diet effective now                 EDUCATION NEEDS:   No education needs have been identified at this time  Skin:  Skin Assessment: Skin Integrity Issues: Skin Integrity Issues:: Stage II Stage II: sacrum  Last BM:  9/21 (type 5)  Height:   Ht Readings from Last 1 Encounters:  12/24/19 5\' 8"  (1.727 m)    Weight:   Wt Readings from Last 1 Encounters:  12/24/19 39.6 kg    Ideal Body Weight:  70 kg  Estimated Nutritional Needs:  Kcal:  2100-2310 kcal (30-33 kcal/kg IBW) Protein:  105-119 grams (1.5-1.7 grams/kg IBW) Fluid:  >/=  2.3 L/day     Jarome Matin, MS, RD, LDN, CNSC Inpatient Clinical Dietitian RD pager # available in [facility]  After hours/weekend pager # available in [facility]

## 2019-12-26 ENCOUNTER — Other Ambulatory Visit: Payer: Self-pay | Admitting: Radiation Oncology

## 2019-12-26 DIAGNOSIS — L8990 Pressure ulcer of unspecified site, unspecified stage: Secondary | ICD-10-CM | POA: Diagnosis present

## 2019-12-26 DIAGNOSIS — K8309 Other cholangitis: Secondary | ICD-10-CM

## 2019-12-26 DIAGNOSIS — K831 Obstruction of bile duct: Secondary | ICD-10-CM

## 2019-12-26 DIAGNOSIS — R935 Abnormal findings on diagnostic imaging of other abdominal regions, including retroperitoneum: Secondary | ICD-10-CM

## 2019-12-26 DIAGNOSIS — K750 Abscess of liver: Secondary | ICD-10-CM

## 2019-12-26 LAB — COMPREHENSIVE METABOLIC PANEL
ALT: 62 U/L — ABNORMAL HIGH (ref 0–44)
AST: 69 U/L — ABNORMAL HIGH (ref 15–41)
Albumin: 1.9 g/dL — ABNORMAL LOW (ref 3.5–5.0)
Alkaline Phosphatase: 634 U/L — ABNORMAL HIGH (ref 38–126)
Anion gap: 9 (ref 5–15)
BUN: 28 mg/dL — ABNORMAL HIGH (ref 6–20)
CO2: 28 mmol/L (ref 22–32)
Calcium: 7.8 mg/dL — ABNORMAL LOW (ref 8.9–10.3)
Chloride: 102 mmol/L (ref 98–111)
Creatinine, Ser: <0.3 mg/dL — ABNORMAL LOW (ref 0.61–1.24)
Glucose, Bld: 173 mg/dL — ABNORMAL HIGH (ref 70–99)
Potassium: 3.6 mmol/L (ref 3.5–5.1)
Sodium: 139 mmol/L (ref 135–145)
Total Bilirubin: 5.6 mg/dL — ABNORMAL HIGH (ref 0.3–1.2)
Total Protein: 6.1 g/dL — ABNORMAL LOW (ref 6.5–8.1)

## 2019-12-26 LAB — PREPARE PLATELET PHERESIS
Unit division: 0
Unit division: 0

## 2019-12-26 LAB — BPAM PLATELET PHERESIS
Blood Product Expiration Date: 202109232359
Blood Product Expiration Date: 202109232359
ISSUE DATE / TIME: 202109202101
ISSUE DATE / TIME: 202109211100
Unit Type and Rh: 5100
Unit Type and Rh: 7300

## 2019-12-26 LAB — CBC WITH DIFFERENTIAL/PLATELET
Abs Immature Granulocytes: 0.06 10*3/uL (ref 0.00–0.07)
Basophils Absolute: 0 10*3/uL (ref 0.0–0.1)
Basophils Relative: 0 %
Eosinophils Absolute: 0 10*3/uL (ref 0.0–0.5)
Eosinophils Relative: 0 %
HCT: 38.4 % — ABNORMAL LOW (ref 39.0–52.0)
Hemoglobin: 13 g/dL (ref 13.0–17.0)
Immature Granulocytes: 1 %
Lymphocytes Relative: 5 %
Lymphs Abs: 0.6 10*3/uL — ABNORMAL LOW (ref 0.7–4.0)
MCH: 27.9 pg (ref 26.0–34.0)
MCHC: 33.9 g/dL (ref 30.0–36.0)
MCV: 82.4 fL (ref 80.0–100.0)
Monocytes Absolute: 0.2 10*3/uL (ref 0.1–1.0)
Monocytes Relative: 1 %
Neutro Abs: 11.9 10*3/uL — ABNORMAL HIGH (ref 1.7–7.7)
Neutrophils Relative %: 93 %
Platelets: 14 10*3/uL — CL (ref 150–400)
RBC: 4.66 MIL/uL (ref 4.22–5.81)
RDW: 14.6 % (ref 11.5–15.5)
WBC: 12.8 10*3/uL — ABNORMAL HIGH (ref 4.0–10.5)
nRBC: 0 % (ref 0.0–0.2)

## 2019-12-26 LAB — FIBRINOGEN: Fibrinogen: 376 mg/dL (ref 210–475)

## 2019-12-26 MED ORDER — SODIUM CHLORIDE 0.9% IV SOLUTION: Freq: Once | INTRAVENOUS | Status: AC

## 2019-12-26 MED ORDER — SODIUM CHLORIDE 0.9% IV SOLUTION: Freq: Once | INTRAVENOUS | Status: DC

## 2019-12-26 NOTE — Progress Notes (Signed)
PROGRESS NOTE    Gabriel [name]  ASN:[id number] DOB: [birth year] DOA: 12/13/2019 PCP: Kerin Perna, NP  Brief Narrative:  39 year old black male Diagnosed with stage Ib [id number] pancreatic cancer 910 2021.5 cm mass-EUS 9/24 2.5 cm mass biopsy = adenocarcinoma-not a candidate for surgery due to SMA involvement-underwent palliative gemcitabine and Abraxane- Previous ERCP with Dr. Ardis Hughs tight mid biliary structure 6 cm / 10 cm uncovered biliary stent was placed in June 2021 he has been scheduled for radiation therapy presented with decreased appetite generalized weakness and was altered and confused on admission 12/26/2019 Notable labs BUNs/creatinine 51/0.5 AST/ALT 62/61 alk phos 471 WBC 38 Platelets were 11,000 on admission CT chest small subsegmental PE moderate right-sided effusion CT abdomen pelvis multiple no attenuating lesions?  Hepatic abscesses 3.6 X3.5 Sepsis from biliary versus urinary source entertained GI consulted Scheduled for ERCP tomorrow  Assessment & Plan:   Principal Problem:   Sepsis secondary to UTI [facility]) Active Problems:   Malignant neoplasm of head of pancreas ([place])   Cancer-related pain   Failure to thrive in adult   Pulmonary embolism (HCC)   Pleural effusion   1. Sepsis on admission with hepatic abscesses  possible cholangitis?  Growing Klebsiella in urine-blood cultures not done on admission a. Continue empiric meropenem do not narrow based on urine culture as this is unlikely to be the source b. GI planning ERCP and stent replacement and will give platelets and FFP etc. prior to the same c. Patient understands plan of care 2. Probable metastatic pancreatic cancer on palliative gemcitabine Abraxane SBRT a. Defer to Dr. Malachy Mood further planning along with palliative care once procedure is done b. Appreciate palliative care input c. CT chest showed pulmonary mets small pleural effusion and retroperitoneal lymphadenopathy d. May require goals of care  discussions by oncology then palliative if he is willing 3. Small subsegmental pulmonary embolism a. Not really a candidate for anticoagulation at this time-May be able to be anticoagulated when platelets are above 50,000 b. Monitor and discuss as an outpatient 4. Moderate right pleural effusion a. Considering tap rule out malignant effusion b. Defer to oncology at this time not requiring any oxygen c. We will repeat chest x-ray 5. Severe thrombocytopenia a. Platelet counts coming up and improving probably secondary to resolution of severe sepsis with probable DIC b. See above  DVT prophylaxis: SCD Code Status: Full code at this time Family Communication: Discussed with fianc/wife on phone and his mother at the bedside Disposition:   Status is: Inpatient  Remains inpatient appropriate because:Persistent severe electrolyte disturbances, Altered mental status, Ongoing diagnostic testing needed not appropriate for outpatient work up and IV treatments appropriate due to intensity of illness or inability to take PO   Dispo: The patient is from: Home              Anticipated d/c is to: Unclear unclear              Anticipated d/c date is: > 3 days              Patient currently is not medically stable to d/c.  Disposition is unclear-patient has advanced disease and it is unclear if he has further options he will need further discussion with his oncologist regarding further planning     Consultants:   Dr. Malachy Mood oncology  Dr. Ardis Hughs gastroenterology  Procedures: Multiple  Antimicrobials: Meropenem   Subjective: Awake alert coherent no distress EOMI NCAT Very flat affect seems somewhat irritable at times No pain  Objective: Vitals:   12/25/19 2222 12/26/19 0507 12/26/19 1100 12/26/19 1240  BP: (!) 110/92 (!) 129/96  (!) 128/96  Pulse: 70 61  90  Resp: 13 14    Temp: 98 F (36.7 C) (!) 97.4 F (36.3 C)  98.3 F (36.8 C)  TempSrc: Oral Oral  Oral  SpO2: 100% 100% 98%  100%  Weight:      Height:        Intake/Output Summary (Last 24 hours) at 12/26/2019 1527 Last data filed at 12/26/2019 1426 Gross per 24 hour  Intake 1722.03 ml  Output 3125 ml  Net -1402.97 ml   Filed Weights   12/24/19 1440  Weight: 39.6 kg    Examination:  General exam: Cachectic and wasted male appearing about stated age Respiratory system: Clear no added sound Cardiovascular system: S1-S2 no murmur rub or gallop-port in right chest Gastrointestinal system: Soft nontender no rebound. Central nervous system: Intact no focal deficit Extremities: Soft supple Skin: No rash Psychiatry: Very flat  Data Reviewed: I have personally reviewed following labs and imaging studies White count down from admission 25-->12.8 Platelet 14 Hemoglobin 13 BUNs/creatinine trending from 51/0.5-28/0.3 AST/ALT 69/62 Total bili now 5.6 up from 3.3  Radiology Studies: CT ABDOMEN PELVIS W CONTRAST  Result Date: 12/25/2019 CLINICAL DATA:  39 year old male with history of abdominal pain. Suspected abscess. EXAM: CT ABDOMEN AND PELVIS WITH CONTRAST TECHNIQUE: Multidetector CT imaging of the abdomen and pelvis was performed using the standard protocol following bolus administration of intravenous contrast. CONTRAST:  67m OMNIPAQUE IOHEXOL 300 MG/ML  SOLN COMPARISON:  CT the abdomen and pelvis 08/29/2019. FINDINGS: Lower chest: Moderate right and small left pleural effusions lying dependently. Some pleural enhancement and thickening in the lower right hemithorax suggestive of malignant pleural involvement. Numerous pulmonary nodules scattered throughout the visualize lung bases, including areas of confluent nodularity in the lower lobes of the lungs bilaterally, most compatible with progressive metastatic disease. Hepatobiliary: Compared to the prior study there are numerous low-attenuation lesions within the liver, highly suspicious for multifocal intrahepatic abscesses (less likely to represent centrally  necrotic metastatic lesions). The largest of these has internal septations and measures up to 3.6 x 3.5 cm in segment 5 (axial image 30 of series 2). One of these lesions in segment 7/8 of the liver has some non dependent gas, strongly favoring abscess. Extensive intrahepatic biliary ductal dilatation with a large volume of pneumobilia. Gallbladder is unremarkable in appearance. Interval placement of common bile duct stent which appears appropriately located. Pancreas: Poorly defined pancreatic mass in the superior aspect of the head of the pancreas (soft tissues in the superior aspect of the head of the pancreas are somewhat indistinct, potentially treatment related). Haziness in the peripancreatic fat, including soft tissue around portions of the celiac axis, common hepatic artery and proximal superior mesenteric artery, as well as the left renal vein, all of which appear narrowed, likely indicative of locally infiltrative tumor. This lesion is difficult to definitively identify and measure, but is estimated to measure approximately 4.4 x 3.0 cm (axial image 27 of series 2). Spleen: The appearance of the spleen is unremarkable. Adrenals/Urinary Tract: Bilateral kidneys and adrenal glands are normal in appearance. No hydroureteronephrosis. Urinary bladder is unremarkable in appearance. Stomach/Bowel: The appearance of the stomach is unremarkable. No pathologic dilatation of small bowel or colon. Portions of the colon appear thickened with some mucosal hyperenhancement (poorly evaluated given the adjacent ascites), best appreciated in the region of the cecum, ascending colon and hepatic flexure,  suggestive of underlying colitis. Appendix is not confidently identified. Vascular/Lymphatic: Aortic atherosclerosis, without evidence of aneurysm or dissection in the abdominal or pelvic vasculature. Vascular involvement from the pancreatic mass, discussed above. Poorly defined infiltrative soft tissue throughout the  retroperitoneum adjacent to the pancreatic mass, with soft tissue completely encircling the upper abdominal aorta immediately below the level of the renal arteries (axial image 30 of series 2), presumably indicative of lymphatic spread of disease. Reproductive: Prostate gland and seminal vesicles are unremarkable in appearance. Other: Large volume of ascites. No pneumoperitoneum. Diffuse body wall edema. Musculoskeletal: There are no aggressive appearing lytic or blastic lesions noted in the visualized portions of the skeleton. IMPRESSION: 1. Today's study demonstrates what appears to be progression of disease with innumerable metastatic lesions throughout the visualize lung bases, probable malignant moderate right pleural effusion and small left pleural effusion, enlargement of the primary pancreatic mass now with vascular involvement, and what appears to be evolving retroperitoneal lymphadenopathy encasing the abdominal aorta. 2. In addition, there has been interval placement of a common bile duct stent, now with substantial intrahepatic biliary ductal dilatation, large amount of pneumatosis, and multiple new low-attenuation liver lesions, some of which have internal gas fluid levels, highly concerning for multiple intra-abdominal abscesses and probable cholangitis. 3. Profound mural thickening and mucosal hyperenhancement in portions of the colon (predominantly cecum, ascending colon and hepatic flexure), concerning for colitis. 4. Aortic atherosclerosis. 5. Additional incidental findings, as above. Electronically Signed   By: Vinnie Langton M.D.   On: 12/25/2019 14:50     Scheduled Meds: . (feeding supplement) PROSource Plus  30 mL Oral TID BM  . sodium chloride   Intravenous Once  . Chlorhexidine Gluconate Cloth  6 each Topical Daily  . feeding supplement  1 Container Oral TID BM  . morphine  60 mg Oral Q12H  . multivitamin with minerals  1 tablet Oral Daily  . nystatin  5 mL Oral QID  . sodium  chloride flush  3 mL Intravenous Q12H   Continuous Infusions: . dextrose 5 % and 0.9% NaCl 60 mL/hr at 12/25/19 1339  . meropenem (MERREM) IV 1 g (12/26/19 1426)     LOS: 3 days    Time spent: [place], MD [facility] To contact the attending provider between 7A-7P or the covering provider during after hours 7P-7A, please log into the web site [email or web address] and access using universal [place] password for that web site. If you do not have the password, please call the hospital operator.  12/26/2019, 3:27 PM

## 2019-12-26 NOTE — H&P (View-Only) (Signed)
Consultation  Referring Provider:  [facility] Primary Care Physician:  Kerin Perna, NP Primary Gastroenterologist:  Dr. 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  He has been treated with chemo and radiation. He presented in June 2021 with elevated LFTs and ductal dilation on imaging, and underwent ERCP per Dr. 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  A 6 cm / 10 cm uncovered biliary stent was placed. He was in the process of being considered for SBRT and had been set up for EUS with fiducial placement. He presented to the emergency room on 12/22/2019 with complaints of progressive weakness and lack of appetite.  He met sepsis criteria and has been started on meropenem. Work-up with CT angio of the chest and CT abdomen and pelvis have been completed. CT angio of the chest shows a small PE right lower lobar sub-segmental pulmonary artery, moderate right effusion interval progression of multiple pulmonary nodules likely metastatic disease. CT of the abdomen pelvis with finding of multiple low-attenuation lesions within the liver highly suspicious for multifocal intrahepatic abscesses less likely necrotic metastatic lesions, the largest is 3.6 x 3.5 cm, 1 of these lesions has some nondependent gas strongly favoring abscess.  There is extensive intrahepatic biliary ductal dilation with a large volume of pneumobilia Common bile duct stent appears appropriately placed Poorly defined pancreatic mass in the superior aspect of the head of the pancreas haziness in the peripancreatic fat including around the celiac axis, common hepatic artery and proximal SMA all of  which are narrowed likely indicating locally infiltrative tumor, large amount of ascites, also profound mural thickening and mucosal hyperenhancement in portions of the colon.  Patient had a profound thrombocytopenia on admission platelets of 9000.  Today's labs pending, yesterday WBC 13.1/hemoglobin 10.7/platelets 22 T bili 3.3/direct bili 1.1/indirect bili 2.2/alk phos 469/AST 44/ALT 49  DIC panel fibrinogen 399/D-dimer elevated at 13.3/platelets 8/INR 1.6  Patient complaining of fairly diffuse pain, abdomen back.  Says able to control with pain medications.  No appetite, tolerating some clears.  No vomiting..      Past Medical History:  Diagnosis Date  . Family history of breast cancer   . Family history of prostate cancer   . pancreatic ca dx'd 12/2018  . Pancreatic mass     Past Surgical History:  Procedure Laterality Date  . BILIARY STENT PLACEMENT N/A 09/13/2019   Procedure: BILIARY STENT PLACEMENT;  Surgeon: Milus Banister, MD;  Location: [facility];  Service: Endoscopy;  Laterality: N/A;  . ENDOSCOPIC RETROGRADE CHOLANGIOPANCREATOGRAPHY (ERCP) WITH PROPOFOL N/A 09/13/2019   Procedure: ENDOSCOPIC RETROGRADE CHOLANGIOPANCREATOGRAPHY (ERCP) WITH PROPOFOL;  Surgeon: Milus Banister, MD;  Location: [facility];  Service: Endoscopy;  Laterality: N/A;  . ESOPHAGOGASTRODUODENOSCOPY (EGD) WITH PROPOFOL N/A 12/28/2018   Procedure: ESOPHAGOGASTRODUODENOSCOPY (EGD) WITH PROPOFOL;  Surgeon: Milus Banister, MD;  Location: [facility];  Service: Endoscopy;  Laterality: N/A;  . EUS N/A 12/28/2018   Procedure: UPPER ENDOSCOPIC ULTRASOUND (EUS) RADIAL;  Surgeon: Milus Banister, MD;  Location: [facility];  Service: Endoscopy;  Laterality: N/A;  . FINE NEEDLE ASPIRATION BIOPSY  12/28/2018   Procedure: FINE NEEDLE ASPIRATION BIOPSY;  Surgeon: Milus Banister, MD;  Location: [facility];  Service: Endoscopy;;  .  IR IMAGING GUIDED PORT INSERTION  01/16/2019  . none    . SPHINCTEROTOMY   09/13/2019   Procedure: SPHINCTEROTOMY;  Surgeon: Milus Banister, MD;  Location: [facility];  Service: Endoscopy;;    Prior to Admission medications   Medication Sig Start Date End Date Taking? Authorizing Provider  morphine (MS CONTIN) 60 MG 12 hr tablet Take 1 tablet (60 mg total) by mouth every 12 (twelve) hours. 12/12/19  Yes Owens Shark, NP  morphine (MSIR) 15 MG tablet Take 1 tablet (15 mg total) by mouth every 4 (four) hours as needed for severe pain. 12/12/19  Yes Owens Shark, NP  lidocaine-prilocaine (EMLA) cream Apply to affected area once Patient not taking: Reported on 09/04/2019 01/23/19   Tish Men, MD  LORazepam (ATIVAN) 0.5 MG tablet Take 1 tablet (0.5 mg total) by mouth every 6 (six) hours as needed (Nausea or vomiting). Patient not taking: Reported on 08/08/2019 01/23/19   Tish Men, MD  ondansetron (ZOFRAN) 8 MG tablet Take 1 tablet (8 mg total) by mouth 2 (two) times daily as needed (Nausea or vomiting). Patient not taking: Reported on 08/08/2019 01/23/19   Tish Men, MD  potassium chloride SA (KLOR-CON) 20 MEQ tablet Take 1 tablet (20 mEq total) by mouth daily. Patient not taking: Reported on 11/09/2019 09/05/19   Owens Shark, NP  prochlorperazine (COMPAZINE) 10 MG tablet Take 1 tablet (10 mg total) by mouth every 6 (six) hours as needed (Nausea or vomiting). Patient not taking: Reported on 08/08/2019 01/23/19   Tish Men, MD    Current Facility-Administered Medications  Medication Dose Route Frequency Provider Last Rate Last Admin  . (feeding supplement) PROSource Plus liquid 30 mL  30 mL Oral TID BM Guilford Shi, MD   30 mL at 12/25/19 2029  . Chlorhexidine Gluconate Cloth 2 % PADS 6 each  6 each Topical Daily Guilford Shi, MD   6 each at 12/25/19 [phone]  . dextrose 5 %-0.9 % sodium chloride infusion   Intravenous Continuous Guilford Shi, MD 60 mL/hr at 12/25/19 1339 Rate Change at 12/25/19 1339  . feeding supplement (BOOST / RESOURCE BREEZE) liquid 1  Container  1 Container Oral TID BM Guilford Shi, MD   1 Container at 12/26/19 0846  . meropenem (MERREM) 1 g in sodium chloride 0.9 % 100 mL IVPB  1 g Intravenous Q8H Angela Adam, RPH 200 mL/hr at 12/26/19 0521 1 g at 12/26/19 0521  . morphine (MS CONTIN) 12 hr tablet 60 mg  60 mg Oral Q12H Ladell Pier, MD   60 mg at 12/25/19 2142  . morphine 2 MG/ML injection 1 mg  1 mg Intravenous Q2H PRN Harold Hedge, MD   1 mg at 12/24/19 1810  . multivitamin with minerals tablet 1 tablet  1 tablet Oral Daily Guilford Shi, MD   1 tablet at 12/25/19 1330  . nystatin (MYCOSTATIN) 100000 UNIT/ML suspension 500,000 Units  5 mL Oral QID Guilford Shi, MD   500,000 Units at 12/25/19 2142  . sodium chloride flush (NS) 0.9 % injection 10-40 mL  10-40 mL Intracatheter PRN Guilford Shi, MD   10 mL at 12/25/19 0459  . sodium chloride flush (NS) 0.9 % injection 3 mL  3 mL Intravenous Q12H Harold Hedge, MD   3 mL at 12/25/19 2148   Facility-Administered Medications Ordered in Other Encounters  Medication Dose Route Frequency Provider Last Rate Last Admin  . oxyCODONE-acetaminophen (PERCOCET/ROXICET) 5-325 MG per tablet 2 tablet  2 tablet Oral Once Tish Men, MD        Allergies as of 12/28/2019 - Review Complete 12/11/2019  Allergen Reaction Noted  . Penicillins Swelling 09/06/2016    Family History  Problem Relation Age of Onset  . Breast cancer Mother 63       second cancer at 25  . Prostate cancer Maternal Grandfather        dx in his 7s  . Esophageal cancer Neg Hx   . Colon cancer Neg Hx   . Pancreatic cancer Neg Hx   . Liver disease Neg Hx   . Stomach cancer Neg Hx     Social History   Socioeconomic History  . Marital status: Single    Spouse name: Not on file  . Number of children: Not on file  . Years of education: Not on file  . Highest education level: Not on file  Occupational History  . Not on file  Tobacco Use  . Smoking status: Current Every Day Smoker     Types: E-cigarettes  . Smokeless tobacco: Never Used  Vaping Use  . Vaping Use: Every day  Substance and Sexual Activity  . Alcohol use: No  . Drug use: No  . Sexual activity: Not on file  Other Topics Concern  . Not on file  Social History Narrative  . Not on file   Social Determinants of Health   Financial Resource Strain: Medium Risk  . Difficulty of Paying Living Expenses: Somewhat hard  Food Insecurity: Food Insecurity Present  . Worried About Charity fundraiser in the Last Year: Sometimes true  . Ran Out of Food in the Last Year: Sometimes true  Transportation Needs: Unmet Transportation Needs  . Lack of Transportation (Medical): Yes  . Lack of Transportation (Non-Medical): Yes  Physical Activity: Inactive  . Days of Exercise per Week: 0 days  . Minutes of Exercise per Session: 0 min  Stress:   . Feeling of Stress : Not on file  Social Connections: Socially Isolated  . Frequency of Communication with Friends and Family: Once a week  . Frequency of Social Gatherings with Friends and Family: Never  . Attends Religious Services: Never  . Active Member of Clubs or Organizations: No  . Attends Archivist Meetings: Never  . Marital Status: Never married  Intimate Partner Violence:   . Fear of Current or Ex-Partner: Not on file  . Emotionally Abused: Not on file  . Physically Abused: Not on file  . Sexually Abused: Not on file    Review of Systems: Gen: Denies any fever, chills, sweats, anorexia, fatigue, weakness, malaise, weight loss, and sleep disorder CV: Denies chest pain, angina, palpitations, syncope, orthopnea, PND, peripheral edema, and claudication. Resp: Denies dyspnea at rest, dyspnea with exercise, cough, sputum, wheezing, coughing up blood, and pleurisy. GI: Denies vomiting blood, jaundice, and fecal incontinence.   Denies dysphagia or odynophagia. GU : Denies urinary burning, blood in urine, urinary frequency, urinary hesitancy, nocturnal  urination, and urinary incontinence. MS: Denies joint pain, limitation of movement, and swelling, stiffness, low back pain, extremity pain. Denies muscle weakness, cramps, atrophy.  Derm: Denies rash, itching, dry skin, hives, moles, warts, or unhealing ulcers.  Psych: Denies depression, anxiety, memory loss, suicidal ideation, hallucinations, paranoia, and confusion. Heme: Denies bruising, bleeding, and enlarged lymph nodes. Neuro:  Denies any headaches, dizziness, paresthesias. Endo:  Denies any problems with DM, thyroid, adrenal function.  Physical Exam: Vital signs in last 24 hours: Temp:  [  97.3 F (36.3 C)-98 F (36.7 C)] 97.4 F (36.3 C) (09/22 0507) Pulse Rate:  [55-70] 61 (09/22 0507) Resp:  [12-16] 14 (09/22 0507) BP: (110-129)/(87-96) 129/96 (09/22 0507) SpO2:  [100 %] 100 % (09/22 0507) Last BM Date: 12/25/19 General:   Alert,  Well-developed, acute and chronically ill appearing African-American male , mother at bedside cooperative in NAD ,frail and quite thin Head:  Normocephalic and atraumatic. Eyes:  Sclera icteric.   Conjunctiva pink. Ears:  Normal auditory acuity. Nose:  No deformity, discharge,  or lesions. Mouth:  No deformity or lesions.   Neck:  Supple; no masses or thyromegaly. Lungs:  Clear throughout to auscultation.   No wheezes, crackles, or rhonchi. Heart:  Regular rate and rhythm; no murmurs, clicks, rubs,  or gallops. Abdomen:  Soft, tender across the upper and mid abdomen, no guarding or rebound BS active,nonpalp mass or hsm.   Rectal:  Deferred  Msk:  Symmetrical without gross deformities. . Pulses:  Normal pulses noted. Extremities:  Without clubbing or edema. Neurologic:  Alert and  oriented x4;  grossly normal neurologically. Skin:  Intact without significant lesions or rashes.. Psych:  Alert and cooperative.   Intake/Output from previous day: 09/21 0701 - 09/22 0700 In: 1835.7 [P.O.:160; I.V.:1186.9; Blood:254; IV Piggyback:234.8] Out: 3000  [ZOXWR:[id number]] Intake/Output this shift: No intake/output data recorded.  Lab Results: Recent Labs    12/24/19 0420 12/25/19 0458 12/25/19 1353  WBC 25.1* 14.2* 13.1*  HGB 12.6* 10.5* 10.7*  HCT 37.1* 30.1* 31.4*  PLT 8* 8*  9* 22*   BMET Recent Labs    12/11/2019 1036 12/24/19 0420  NA 136 137  K 4.8 4.1  CL 97* 101  CO2 26 26  GLUCOSE 48* 79  BUN 51* 33*  CREATININE 0.59* 0.42*  CALCIUM 8.1* 7.8*   LFT Recent Labs    12/25/19 1712  PROT 6.0*  ALBUMIN 2.0*  AST 44*  ALT 49*  ALKPHOS 469*  BILITOT 3.3*  BILIDIR 1.1*  IBILI 2.2*   PT/INR Recent Labs    12/24/19 0420 12/25/19 0458  LABPROT 21.4* 18.6*  INR 1.9* 1.6*   Hepatitis Panel No results for input(s): HEPBSAG, HCVAB, HEPAIGM, HEPBIGM in the last 72 hours.    IMPRESSION:  #44  39 year old African-American male with locally advanced adenocarcinoma of the pancreas diagnosed 12/2018.  He did complete a course of chemotherapy and radiation. Patient presented June 2021 with elevated LFTs and dilated CBD and underwent ERCP with uncovered metal stent placement. He was being considered for SBRT, had not followed up for scheduling for fiduciary placement etc. earlier this summer.  Tentatively had been scheduled for October. He presented 3 days ago with progressive weakness, has met criteria for sepsis, and imaging with CT of the chest abdomen pelvis now shows fairly widely metastatic pancreatic cancer with multiple lung nodules, pleural thickening in the right lower hemithorax and a small pulmonary embolus. He has had significant progression of locally advanced disease in the abdomen, now with significant adenopathy encasing the aorta, new development of ascites. Multiple hepatic lesions, new probably representing small abscesses versus necrotic tumor.  The largest of these 3.6 x 3.5 cm.  Also with concerns for cholangitis with significant intrahepatic ductal dilation.  Rule out cholangitis with subsequent  development of hepatic abscesses.  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 Ardis Hughs in AM tomorrow.  Long discussion with patient mom  and patient's fianc by phone.  Procedure discussed in detail including indications risks and benefits.  Continue meropenem Have ordered 2 units of platelets for this evening, and 2 units FFP early morning hours tomorrow prior to procedure. CBC and INR to be done prior to procedure in a.m.  Amy Esterwood PA-C 12/26/2019, 8:56 AM   GI ATTENDING  History, laboratories, x-rays, prior endoscopy report reviewed.  Patient seen and examined.  Agree with comprehensive consultation note as outlined above.  Unfortunate gentleman with widely metastatic adenocarcinoma of the pancreas.  Presents with what appears to be cholangitis.  Likely has developed hepatic abscess (based on imaging) secondary to the same and/or necrotic metastases.  Rising alkaline phosphatase.  Agree with meropenem therapy.  Oncology on board to discuss interval changes and implications.  Agree with plans for ERCP with stent in-stent placement to assure adequate drainage.The nature of the procedure, as well as the risks, benefits, and alternatives were carefully and thoroughly reviewed with the patient. Ample time for discussion and questions allowed. The patient understood, was satisfied, and agreed to proceed.  Docia Chuck. Geri Seminole., M.D. [facility] Division of Gastroenterology

## 2019-12-26 NOTE — Consult Note (Addendum)
  Consultation  Referring Provider:  [facility] Primary Care Physician:  Edwards, Michelle P, NP Primary Gastroenterologist:  Dr. 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  He has been treated with chemo and radiation. He presented in June 2021 with elevated LFTs and ductal dilation on imaging, and underwent ERCP per Dr. 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  A 6 cm / 10 cm uncovered biliary stent was placed. He was in Gabriel process of being considered for SBRT and had been set up for EUS with fiducial placement. He presented to [facility] emergency room on 12/22/2019 with complaints of progressive weakness and lack of appetite.  He met sepsis criteria and has been started on meropenem. Work-up with CT angio of Gabriel chest and CT abdomen and pelvis have been completed. CT angio of Gabriel chest shows a small PE right lower lobar sub-segmental pulmonary artery, moderate right effusion interval progression of multiple pulmonary nodules likely metastatic disease. CT of Gabriel abdomen pelvis with finding of multiple low-attenuation lesions within Gabriel liver highly suspicious for multifocal intrahepatic abscesses less likely necrotic metastatic lesions, Gabriel largest is 3.6 x 3.5 cm, 1 of these lesions has some nondependent gas strongly favoring abscess.  There is extensive intrahepatic biliary ductal dilation with a large volume of pneumobilia Common bile duct stent appears appropriately placed Poorly defined pancreatic mass in Gabriel superior aspect of Gabriel head of Gabriel pancreas haziness in Gabriel peripancreatic fat including around Gabriel celiac axis, common hepatic artery and proximal SMA all of  which are narrowed likely indicating locally infiltrative tumor, large amount of ascites, also profound mural thickening and mucosal hyperenhancement in portions of Gabriel colon.  [name] had a profound thrombocytopenia on admission platelets of 9000.  Today's labs pending, yesterday WBC 13.1/hemoglobin 10.7/platelets 22 T bili 3.3/direct bili 1.1/indirect bili 2.2/alk phos 469/AST 44/ALT 49  DIC panel fibrinogen 399/D-dimer elevated at 13.3/platelets 8/INR 1.6  [name] complaining of fairly diffuse pain, abdomen back.  Says able to control with pain medications.  No appetite, tolerating some clears.  No vomiting..      Past Medical History:  Diagnosis Date  . Family history of breast cancer   . Family history of prostate cancer   . pancreatic ca dx'd 12/2018  . Pancreatic mass     Past Surgical History:  Procedure Laterality Date  . BILIARY STENT PLACEMENT N/A 09/13/2019   Procedure: BILIARY STENT PLACEMENT;  Surgeon: Jacobs, Daniel P, MD;  Location: [facility];  Service: Endoscopy;  Laterality: N/A;  . ENDOSCOPIC RETROGRADE CHOLANGIOPANCREATOGRAPHY (ERCP) WITH PROPOFOL N/A 09/13/2019   Procedure: ENDOSCOPIC RETROGRADE CHOLANGIOPANCREATOGRAPHY (ERCP) WITH PROPOFOL;  Surgeon: Jacobs, Daniel P, MD;  Location: [facility];  Service: Endoscopy;  Laterality: N/A;  . ESOPHAGOGASTRODUODENOSCOPY (EGD) WITH PROPOFOL N/A 12/28/2018   Procedure: ESOPHAGOGASTRODUODENOSCOPY (EGD) WITH PROPOFOL;  Surgeon: Jacobs, Daniel P, MD;  Location: [facility];  Service: Endoscopy;  Laterality: N/A;  . EUS N/A 12/28/2018   Procedure: UPPER ENDOSCOPIC ULTRASOUND (EUS) RADIAL;  Surgeon: Jacobs, Daniel P, MD;  Location: [facility];  Service: Endoscopy;  Laterality: N/A;  . FINE NEEDLE ASPIRATION BIOPSY  12/28/2018   Procedure: FINE NEEDLE ASPIRATION BIOPSY;  Surgeon: Jacobs, Daniel P, MD;  Location: [facility];  Service: Endoscopy;;  .   IR IMAGING GUIDED PORT INSERTION  01/16/2019  . none    . SPHINCTEROTOMY   09/13/2019   Procedure: SPHINCTEROTOMY;  Surgeon: Jacobs, Daniel P, MD;  Location: [facility];  Service: Endoscopy;;    Prior to Admission medications   Medication Sig Start Date End Date Taking? Authorizing Provider  morphine (MS CONTIN) 60 MG 12 hr tablet Take 1 tablet (60 mg total) by mouth every 12 (twelve) hours. 12/12/19  Yes Thomas, Lisa K, NP  morphine (MSIR) 15 MG tablet Take 1 tablet (15 mg total) by mouth every 4 (four) hours as needed for severe pain. 12/12/19  Yes Thomas, Lisa K, NP  lidocaine-prilocaine (EMLA) cream Apply to affected area once [name] not taking: Reported on 09/04/2019 01/23/19   Zhao, Yan, MD  LORazepam (ATIVAN) 0.5 MG tablet Take 1 tablet (0.5 mg total) by mouth every 6 (six) hours as needed (Nausea or vomiting). [name] not taking: Reported on 08/08/2019 01/23/19   Zhao, Yan, MD  ondansetron (ZOFRAN) 8 MG tablet Take 1 tablet (8 mg total) by mouth 2 (two) times daily as needed (Nausea or vomiting). [name] not taking: Reported on 08/08/2019 01/23/19   Zhao, Yan, MD  potassium chloride SA (KLOR-CON) 20 MEQ tablet Take 1 tablet (20 mEq total) by mouth daily. [name] not taking: Reported on 11/09/2019 09/05/19   Thomas, Lisa K, NP  prochlorperazine (COMPAZINE) 10 MG tablet Take 1 tablet (10 mg total) by mouth every 6 (six) hours as needed (Nausea or vomiting). [name] not taking: Reported on 08/08/2019 01/23/19   Zhao, Yan, MD    Current Facility-Administered Medications  Medication Dose Route Frequency Provider Last Rate Last Admin  . (feeding supplement) PROSource Plus liquid 30 mL  30 mL Oral TID BM Kamineni, Neelima, MD   30 mL at 12/25/19 2029  . Chlorhexidine Gluconate Cloth 2 % PADS 6 each  6 each Topical Daily Kamineni, Neelima, MD   6 each at 12/25/19 0942  . dextrose 5 %-0.9 % sodium chloride infusion   Intravenous Continuous Kamineni, Neelima, MD 60 mL/hr at 12/25/19 1339 Rate Change at 12/25/19 1339  . feeding supplement (BOOST / RESOURCE BREEZE) liquid 1  Container  1 Container Oral TID BM Kamineni, Neelima, MD   1 Container at 12/26/19 0846  . meropenem (MERREM) 1 g in sodium chloride 0.9 % 100 mL IVPB  1 g Intravenous Q8H Jackson, Rachel E, RPH 200 mL/hr at 12/26/19 0521 1 g at 12/26/19 0521  . morphine (MS CONTIN) 12 hr tablet 60 mg  60 mg Oral Q12H Sherrill, Gary B, MD   60 mg at 12/25/19 2142  . morphine 2 MG/ML injection 1 mg  1 mg Intravenous Q2H PRN Segal, Jared E, MD   1 mg at 12/24/19 1810  . multivitamin with minerals tablet 1 tablet  1 tablet Oral Daily Kamineni, Neelima, MD   1 tablet at 12/25/19 1330  . nystatin (MYCOSTATIN) 100000 UNIT/ML suspension 500,000 Units  5 mL Oral QID Kamineni, Neelima, MD   500,000 Units at 12/25/19 2142  . sodium chloride flush (NS) 0.9 % injection 10-40 mL  10-40 mL Intracatheter PRN Kamineni, Neelima, MD   10 mL at 12/25/19 0459  . sodium chloride flush (NS) 0.9 % injection 3 mL  3 mL Intravenous Q12H Segal, Jared E, MD   3 mL at 12/25/19 2148   Facility-Administered Medications Ordered in Other Encounters  Medication Dose Route Frequency Provider Last Rate Last Admin  . oxyCODONE-acetaminophen (PERCOCET/ROXICET) 5-325 MG per tablet 2 tablet    2 tablet Oral Once Zhao, Yan, MD        Allergies as of 12/10/2019 - Review Complete 12/30/2019  Allergen Reaction Noted  . Penicillins Swelling 09/06/2016    Family History  Problem Relation Age of Onset  . Breast cancer Mother 49       second cancer at 66  . Prostate cancer Maternal Grandfather        dx in his 70s  . Esophageal cancer Neg Hx   . Colon cancer Neg Hx   . Pancreatic cancer Neg Hx   . Liver disease Neg Hx   . Stomach cancer Neg Hx     Social History   Socioeconomic History  . Marital status: Single    Spouse name: Not on file  . Number of children: Not on file  . Years of education: Not on file  . Highest education level: Not on file  Occupational History  . Not on file  Tobacco Use  . Smoking status: Current Every Day Smoker     Types: E-cigarettes  . Smokeless tobacco: Never Used  Vaping Use  . Vaping Use: Every day  Substance and Sexual Activity  . Alcohol use: No  . Drug use: No  . Sexual activity: Not on file  Other Topics Concern  . Not on file  Social History Narrative  . Not on file   Social Determinants of Health   Financial Resource Strain: Medium Risk  . Difficulty of Paying Living Expenses: Somewhat hard  Food Insecurity: Food Insecurity Present  . Worried About Running Out of Food in [place] Last Year: Sometimes true  . Ran Out of Food in [place] Last Year: Sometimes true  Transportation Needs: Unmet Transportation Needs  . Lack of Transportation (Medical): Yes  . Lack of Transportation (Non-Medical): Yes  Physical Activity: Inactive  . Days of Exercise per Week: 0 days  . Minutes of Exercise per Session: 0 min  Stress:   . Feeling of Stress : Not on file  Social Connections: Socially Isolated  . Frequency of Communication with Friends and Family: Once a week  . Frequency of Social Gatherings with Friends and Family: Never  . Attends Religious Services: Never  . Active Member of Clubs or Organizations: No  . Attends Club or Organization Meetings: Never  . Marital Status: Never married  Intimate Partner Violence:   . Fear of Current or Ex-Partner: Not on file  . Emotionally Abused: Not on file  . Physically Abused: Not on file  . Sexually Abused: Not on file    Review of Systems: Gen: Denies any fever, chills, sweats, anorexia, fatigue, weakness, malaise, weight loss, and sleep disorder CV: Denies chest pain, angina, palpitations, syncope, orthopnea, PND, peripheral edema, and claudication. Resp: Denies dyspnea at rest, dyspnea with exercise, cough, sputum, wheezing, coughing up blood, and pleurisy. GI: Denies vomiting blood, jaundice, and fecal incontinence.   Denies dysphagia or odynophagia. GU : Denies urinary burning, blood in urine, urinary frequency, urinary hesitancy, nocturnal  urination, and urinary incontinence. MS: Denies joint pain, limitation of movement, and swelling, stiffness, low back pain, extremity pain. Denies muscle weakness, cramps, atrophy.  Derm: Denies rash, itching, dry skin, hives, moles, warts, or unhealing ulcers.  Psych: Denies depression, anxiety, memory loss, suicidal ideation, hallucinations, paranoia, and confusion. Heme: Denies bruising, bleeding, and enlarged lymph nodes. Neuro:  Denies any headaches, dizziness, paresthesias. Endo:  Denies any problems with DM, thyroid, adrenal function.  Physical Exam: Vital signs in last 24 hours: Temp:  [  97.3 F (36.3 C)-98 F (36.7 C)] 97.4 F (36.3 C) (09/22 0507) Pulse Rate:  [55-70] 61 (09/22 0507) Resp:  [12-16] 14 (09/22 0507) BP: (110-129)/(87-96) 129/96 (09/22 0507) SpO2:  [100 %] 100 % (09/22 0507) Last BM Date: 12/25/19 General:   Alert,  Well-developed, acute and chronically ill appearing African-American male , mother at bedside cooperative in NAD ,frail and quite thin Head:  Normocephalic and atraumatic. Eyes:  Sclera icteric.   Conjunctiva pink. Ears:  Normal auditory acuity. Nose:  No deformity, discharge,  or lesions. Mouth:  No deformity or lesions.   Neck:  Supple; no masses or thyromegaly. Lungs:  Clear throughout to auscultation.   No wheezes, crackles, or rhonchi. Heart:  Regular rate and rhythm; no murmurs, clicks, rubs,  or gallops. Abdomen:  Soft, tender across Gabriel upper and mid abdomen, no guarding or rebound BS active,nonpalp mass or hsm.   Rectal:  Deferred  Msk:  Symmetrical without gross deformities. . Pulses:  Normal pulses noted. Extremities:  Without clubbing or edema. Neurologic:  Alert and  oriented x4;  grossly normal neurologically. Skin:  Intact without significant lesions or rashes.. Psych:  Alert and cooperative.   Intake/Output from previous day: 09/21 0701 - 09/22 0700 In: 1835.7 [P.O.:160; I.V.:1186.9; Blood:254; IV Piggyback:234.8] Out: 3000  [Urine:3000] Intake/Output this shift: No intake/output data recorded.  Lab Results: Recent Labs    12/24/19 0420 12/25/19 0458 12/25/19 1353  WBC 25.1* 14.2* 13.1*  HGB 12.6* 10.5* 10.7*  HCT 37.1* 30.1* 31.4*  PLT 8* 8*  9* 22*   BMET Recent Labs    12/19/2019 1036 12/24/19 0420  NA 136 137  K 4.8 4.1  CL 97* 101  CO2 26 26  GLUCOSE 48* 79  BUN 51* 33*  CREATININE 0.59* 0.42*  CALCIUM 8.1* 7.8*   LFT Recent Labs    12/25/19 1712  PROT 6.0*  ALBUMIN 2.0*  AST 44*  ALT 49*  ALKPHOS 469*  BILITOT 3.3*  BILIDIR 1.1*  IBILI 2.2*   PT/INR Recent Labs    12/24/19 0420 12/25/19 0458  LABPROT 21.4* 18.6*  INR 1.9* 1.6*   Hepatitis Panel No results for input(s): HEPBSAG, HCVAB, HEPAIGM, HEPBIGM in Gabriel last 72 hours.    IMPRESSION:  #1  38-year-old African-American male with locally advanced adenocarcinoma of Gabriel pancreas diagnosed 12/2018.  He did complete a course of chemotherapy and radiation. [name] presented June 2021 with elevated LFTs and dilated CBD and underwent ERCP with uncovered metal stent placement. He was being considered for SBRT, had not followed up for scheduling for fiduciary placement etc. earlier this summer.  Tentatively had been scheduled for October. He presented 3 days ago with progressive weakness, has met criteria for sepsis, and imaging with CT of Gabriel chest abdomen pelvis now shows fairly widely metastatic pancreatic cancer with multiple lung nodules, pleural thickening in Gabriel right lower hemithorax and a small pulmonary embolus. He has had significant progression of locally advanced disease in Gabriel abdomen, now with significant adenopathy encasing Gabriel aorta, new development of ascites. Multiple hepatic lesions, new probably representing small abscesses versus necrotic tumor.  Gabriel largest of these 3.6 x 3.5 cm.  Also with concerns for cholangitis with significant intrahepatic ductal dilation.  Rule out cholangitis with subsequent  development of hepatic abscesses.  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 Jacobs in AM tomorrow.  Long discussion with [name] mom   and [name]'s fianc by phone.  Procedure discussed in detail including indications risks and benefits.  Continue meropenem Have ordered 2 units of platelets for this evening, and 2 units FFP early morning hours tomorrow prior to procedure. CBC and INR to be done prior to procedure in a.m.  Amy Esterwood PA-C 12/26/2019, 8:56 AM   GI ATTENDING  History, laboratories, x-rays, prior endoscopy report reviewed.  [name] seen and examined.  Agree with comprehensive consultation note as outlined above.  Unfortunate gentleman with widely metastatic adenocarcinoma of Gabriel pancreas.  Presents with what appears to be cholangitis.  Likely has developed hepatic abscess (based on imaging) secondary to Gabriel same and/or necrotic metastases.  Rising alkaline phosphatase.  Agree with meropenem therapy.  Oncology on board to discuss interval changes and implications.  Agree with plans for ERCP with stent in-stent placement to assure adequate drainage.Gabriel nature of Gabriel procedure, as well as Gabriel risks, benefits, and alternatives were carefully and thoroughly reviewed with Gabriel [name]. Ample time for discussion and questions allowed. [place] [name] understood, was satisfied, and agreed to proceed.  Seab Axel N. Kiyan Burmester, Jr., M.D. [facility] of Gastroenterology   

## 2019-12-26 NOTE — Progress Notes (Addendum)
HEMATOLOGY-ONCOLOGY PROGRESS NOTE  SUBJECTIVE: Reports abdominal pain is worse than back pain.  Oncology History  Malignant neoplasm of head of pancreas [facility])   Initial Diagnosis   Pancreatic adenocarcinoma ([place])   12/15/2018 Imaging   CT abdomen/pelvis w/ contrast: IMPRESSION: 1. Suggestion of a 1.5 cm mass within the mid aspect of the pancreatic body with associated atrophy of the upstream pancreatic tail and associated pancreatic ductal dilatation. Recommend further evaluation with MRI. There is soft tissue fullness around the superior mesenteric artery and celiac axis. This may represent edema or potentially tumor involvement. 2. Tree-in-bud nodularity within the peripheral aspect of the lower lobes bilaterally which may be secondary to an infectious or inflammatory process. Recommend follow-up chest CT in 3 months to assess for interval change. 3. These results will be called to the ordering clinician or representative by the Radiologist Assistant, and communication documented in the PACS or zVision Dashboard.   12/29/2018 Procedure   EUS: -2.4 x 1.9cm mass in the body of the pancreas causing the main pancreatic duct obstruction and clearly involving the celiac trunk. Biopsied. -Heterogenous soft tissue mass in the neck, measuring 3.6cm maximally. This was not sampled, presumed to be a large thyroid gland.  -No peripancreatic adenopathy -CBD was normal, non-dilated.   12/29/2018 Pathology Results   CASE: [id number]   DIAGNOSIS:  - Malignant cells consistent with adenocarcinoma  - See comment.   01/17/2019 Imaging   PET: IMPRESSION: 1. Suspected enlargement of the pancreatic mass, with abnormal hypermetabolic activity at the junction of the pancreatic body and tail currently measuring 3.4 by 2.9 by 2.4 cm. Although anatomic localization is hampered by the patient's extreme paucity of adipose tissue (making separation of adjacent structures difficult on noncontrast  CT), there is thought to be a high likelihood tumor extending around the vicinity of the celiac trunk and possibly the superior mesenteric artery. In this case, MRI might provide better anatomic localization with respect to perivascular involvement. 2. Potential mild left periaortic adenopathy, maximum SUV of the indistinct left periaortic lymph nodes is approximately 3.3 which is above blood pool levels. 3. Scattered ground-glass density nodules in the lungs favoring the upper lobes, with tree-in-bud nodularity in the lung bases favoring the lower lobes. None of these lesions are hypermetabolic. While possibilities include atypical infectious process drug reaction, surveillance of the chest is recommended to exclude low-grade adenocarcinoma. 4.  Aortic Atherosclerosis (ICD10-I70.0).   01/24/2019 -  Chemotherapy   The patient had PACLitaxel-protein bound (ABRAXANE) chemo infusion 150 mg, 100 mg/m2 = 150 mg (100 % of original dose 100 mg/m2), Intravenous,  Once, 6 of 8 cycles Dose modification: 100 mg/m2 (original dose 100 mg/m2, Cycle 1, Reason: Provider Judgment) Administration: 150 mg (01/24/2019), 150 mg (02/14/2019), 150 mg (03/02/2019), 150 mg (03/16/2019), 150 mg (04/26/2019), 150 mg (07/04/2019), 150 mg (07/18/2019), 150 mg (05/09/2019), 150 mg (05/23/2019), 150 mg (08/08/2019), 150 mg (08/22/2019), 150 mg (06/22/2019) gemcitabine (GEMZAR) 1,102 mg in sodium chloride 0.9 % 250 mL chemo infusion, 800 mg/m2 = 1,102 mg (100 % of original dose 800 mg/m2), Intravenous,  Once, 6 of 8 cycles Dose modification: 800 mg/m2 (original dose 800 mg/m2, Cycle 1, Reason: Provider Judgment), 1,000 mg/m2 (original dose 800 mg/m2, Cycle 5, Reason: Provider Judgment) Administration: 1,102 mg (01/24/2019), 1,102 mg (02/14/2019), 1,102 mg (03/02/2019), 1,102 mg (03/16/2019), 1,102 mg (04/26/2019), 1,406 mg (07/04/2019), 1,406 mg (07/18/2019), 1,406 mg (05/09/2019), 1,406 mg (05/23/2019), 1,406 mg (08/08/2019), 1,406 mg  (08/22/2019), 1,406 mg (06/22/2019)  for chemotherapy treatment.    02/28/2019  Cancer Staging   Staging form: Exocrine Pancreas, AJCC 8th Edition - Clinical: Stage IB (cT2, cN0, cM0) - Signed by Tish Men, MD on 02/28/2019   04/16/2019 Imaging   CT CAP: IMPRESSION: 1. Margins of pancreatic neoplasm are difficult to identify. Similar appearance of soft tissue infiltration within the retroperitoneum with progressive narrowing of the portal venous confluence by tumor. No specific findings of solid organ metastasis or nodal metastasis within the abdomen or pelvis. 2. Unchanged appearance of multifocal sub solid nodules within the upper lung zones and bilateral lower lung zone tree-in-bud nodularity. As mentioned previously findings are nonspecific and may be inflammatory or infectious in etiology. Metastatic disease is not excluded. 3. Aortic atherosclerosis. 4. Right lobe of thyroid gland nodule. Consider further evaluation with thyroid ultrasound. If patient is clinically hyperthyroid, consider nuclear medicine thyroid uptake and scan.    PHYSICAL EXAMINATION:  Vitals:   12/25/19 2222 12/26/19 0507  BP: (!) 110/92 (!) 129/96  Pulse: 70 61  Resp: 13 14  Temp: 98 F (36.7 C) (!) 97.4 F (36.3 C)  SpO2: 100% 100%   Filed Weights   12/24/19 1440  Weight: 87 lb 4.8 oz (39.6 kg)    Intake/Output from previous day: 09/21 0701 - 09/22 0700 In: 1835.7 [P.O.:160; I.V.:1186.9; Blood:254; IV Piggyback:234.8] Out: 3000 [Urine:3000]  GENERAL: Alert, cachectic, no distress; minimal verbal interaction HEENT: Mild scleral icterus.  No thrush. SKIN: Small scattered ecchymoses. LUNGS: Breath sounds diminished on the right side anteriorly.  No respiratory distress. HEART: regular rate & rhythm  ABDOMEN: Mild generalized tenderness. Vascular: Pitting edema at the lower legs bilaterally. NEURO: Follows commands  LABORATORY DATA:  I have reviewed the data as listed CMP Latest Ref Rng &  Units 12/25/2019 12/24/2019 12/17/2019  Glucose 70 - 99 mg/dL - 79 48(L)  BUN 6 - 20 mg/dL - 33(H) 51(H)  Creatinine 0.61 - 1.24 mg/dL - 0.42(L) 0.59(L)  Sodium 135 - 145 mmol/L - 137 136  Potassium 3.5 - 5.1 mmol/L - 4.1 4.8  Chloride 98 - 111 mmol/L - 101 97(L)  CO2 22 - 32 mmol/L - 26 26  Calcium 8.9 - 10.3 mg/dL - 7.8(L) 8.1(L)  Total Protein 6.5 - 8.1 g/dL 6.0(L) - 7.0  Total Bilirubin 0.3 - 1.2 mg/dL 3.3(H) - 3.2(H)  Alkaline Phos 38 - 126 U/L 469(H) - 471(H)  AST 15 - 41 U/L 44(H) - 62(H)  ALT 0 - 44 U/L 49(H) - 61(H)    Lab Results  Component Value Date   WBC 13.1 (H) 12/25/2019   HGB 10.7 (L) 12/25/2019   HCT 31.4 (L) 12/25/2019   MCV 82.0 12/25/2019   PLT 22 (LL) 12/25/2019   NEUTROABS 11.8 (H) 12/25/2019    CT Head Wo Contrast  Result Date: 12/10/2019 CLINICAL DATA:  Pancreatic cancer, chemotherapy, weakness, headache EXAM: CT HEAD WITHOUT CONTRAST TECHNIQUE: Contiguous axial images were obtained from the base of the skull through the vertex without intravenous contrast. COMPARISON:  None. FINDINGS: Brain: No evidence of acute infarction, hemorrhage, hydrocephalus, extra-axial collection or mass lesion/mass effect. Vascular: No hyperdense vessel or unexpected calcification. Skull: Normal. Negative for fracture or focal lesion. Sinuses/Orbits: No acute finding. Other: None. IMPRESSION: No acute intracranial pathology. No non-contrast CT evidence of intracranial metastatic disease or findings to explain headache. Contrast enhanced MRI may be used to more sensitively evaluate for intracranial metastatic disease if desired. Electronically Signed   By: Eddie Candle M.D.   On: 12/08/2019 11:30   CT Angio Chest PE W  and/or Wo Contrast  Result Date: 12/12/2019 CLINICAL DATA:  Shortness of breath and chest pain. Suspect pulmonary embolism. History of pancreatic cancer on chemotherapy. EXAM: CT ANGIOGRAPHY CHEST WITH CONTRAST TECHNIQUE: Multidetector CT imaging of the chest was  performed using the standard protocol during bolus administration of intravenous contrast. Multiplanar CT image reconstructions and MIPs were obtained to evaluate the vascular anatomy. CONTRAST:  182mL OMNIPAQUE IOHEXOL 350 MG/ML SOLN COMPARISON:  08/29/2019 FINDINGS: Cardiovascular: Right IJ central venous catheter is present with tip over the cavoatrial junction. Heart is normal size. Thoracic aorta is normal caliber. Pulmonary arterial system is well opacified and demonstrates a single small embolus over a right lower lobar subsegmental pulmonary artery. No left-sided pulmonary emboli. Mediastinum/Nodes: No evidence of mediastinal or hilar adenopathy. Remaining mediastinal structures are unremarkable. Lungs/Pleura: Lungs are adequately inflated demonstrate a moderate size right pleural effusion with associated atelectasis in the right base. There are numerous small bilateral pulmonary nodules with interval progression likely worsening metastatic disease. Airways are unremarkable. Upper Abdomen: Not well evaluated on this arterial phase CT scan. Suggestion of partial visualization of patient's known biliary stent. Moderate pneumobilia is present. Couple low-density foci over the right lobe of the liver with air-fluid levels of uncertain clinical significance. Musculoskeletal: No focal abnormality. Review of the MIP images confirms the above findings. IMPRESSION: 1. Single small embolus over a right lower lobar subsegmental pulmonary artery. 2. Moderate size right pleural effusion with associated right basilar atelectasis. 3. Interval progression of multiple pulmonary nodules likely metastatic disease due to patient's known pancreatic cancer. 4. Couple indeterminate low-density foci over the right lobe of the liver with air-fluid levels of not well evaluated on this arterial phase contrast CT. Pneumobilia and partially visualized biliary stent. Recommend clinical correlation as further evaluation with venous phase  contrast enhanced CT of the abdomen/pelvis would be helpful. Critical Value/emergent results were called by telephone at the time of interpretation on 12/25/2019 at 3:08 pm to provider [facility] RAY , who verbally acknowledged these results. Electronically Signed   By: Marin Olp M.D.   On: 12/10/2019 15:10   CT ABDOMEN PELVIS W CONTRAST  Result Date: 12/25/2019 CLINICAL DATA:  39 year old male with history of abdominal pain. Suspected abscess. EXAM: CT ABDOMEN AND PELVIS WITH CONTRAST TECHNIQUE: Multidetector CT imaging of the abdomen and pelvis was performed using the standard protocol following bolus administration of intravenous contrast. CONTRAST:  66mL OMNIPAQUE IOHEXOL 300 MG/ML  SOLN COMPARISON:  CT the abdomen and pelvis 08/29/2019. FINDINGS: Lower chest: Moderate right and small left pleural effusions lying dependently. Some pleural enhancement and thickening in the lower right hemithorax suggestive of malignant pleural involvement. Numerous pulmonary nodules scattered throughout the visualize lung bases, including areas of confluent nodularity in the lower lobes of the lungs bilaterally, most compatible with progressive metastatic disease. Hepatobiliary: Compared to the prior study there are numerous low-attenuation lesions within the liver, highly suspicious for multifocal intrahepatic abscesses (less likely to represent centrally necrotic metastatic lesions). The largest of these has internal septations and measures up to 3.6 x 3.5 cm in segment 5 (axial image 30 of series 2). One of these lesions in segment 7/8 of the liver has some non dependent gas, strongly favoring abscess. Extensive intrahepatic biliary ductal dilatation with a large volume of pneumobilia. Gallbladder is unremarkable in appearance. Interval placement of common bile duct stent which appears appropriately located. Pancreas: Poorly defined pancreatic mass in the superior aspect of the head of the pancreas (soft tissues in the  superior aspect of  the head of the pancreas are somewhat indistinct, potentially treatment related). Haziness in the peripancreatic fat, including soft tissue around portions of the celiac axis, common hepatic artery and proximal superior mesenteric artery, as well as the left renal vein, all of which appear narrowed, likely indicative of locally infiltrative tumor. This lesion is difficult to definitively identify and measure, but is estimated to measure approximately 4.4 x 3.0 cm (axial image 27 of series 2). Spleen: The appearance of the spleen is unremarkable. Adrenals/Urinary Tract: Bilateral kidneys and adrenal glands are normal in appearance. No hydroureteronephrosis. Urinary bladder is unremarkable in appearance. Stomach/Bowel: The appearance of the stomach is unremarkable. No pathologic dilatation of small bowel or colon. Portions of the colon appear thickened with some mucosal hyperenhancement (poorly evaluated given the adjacent ascites), best appreciated in the region of the cecum, ascending colon and hepatic flexure, suggestive of underlying colitis. Appendix is not confidently identified. Vascular/Lymphatic: Aortic atherosclerosis, without evidence of aneurysm or dissection in the abdominal or pelvic vasculature. Vascular involvement from the pancreatic mass, discussed above. Poorly defined infiltrative soft tissue throughout the retroperitoneum adjacent to the pancreatic mass, with soft tissue completely encircling the upper abdominal aorta immediately below the level of the renal arteries (axial image 30 of series 2), presumably indicative of lymphatic spread of disease. Reproductive: Prostate gland and seminal vesicles are unremarkable in appearance. Other: Large volume of ascites. No pneumoperitoneum. Diffuse body wall edema. Musculoskeletal: There are no aggressive appearing lytic or blastic lesions noted in the visualized portions of the skeleton. IMPRESSION: 1. Today's study demonstrates what  appears to be progression of disease with innumerable metastatic lesions throughout the visualize lung bases, probable malignant moderate right pleural effusion and small left pleural effusion, enlargement of the primary pancreatic mass now with vascular involvement, and what appears to be evolving retroperitoneal lymphadenopathy encasing the abdominal aorta. 2. In addition, there has been interval placement of a common bile duct stent, now with substantial intrahepatic biliary ductal dilatation, large amount of pneumatosis, and multiple new low-attenuation liver lesions, some of which have internal gas fluid levels, highly concerning for multiple intra-abdominal abscesses and probable cholangitis. 3. Profound mural thickening and mucosal hyperenhancement in portions of the colon (predominantly cecum, ascending colon and hepatic flexure), concerning for colitis. 4. Aortic atherosclerosis. 5. Additional incidental findings, as above. Electronically Signed   By: Vinnie Langton M.D.   On: 12/25/2019 14:50   DG Chest Port 1 View  Result Date: 12/07/2019 CLINICAL DATA:  Pancreatic cancer, weakness EXAM: PORTABLE CHEST 1 VIEW COMPARISON:  08/29/2019 chest CT. FINDINGS: Right internal jugular Port-A-Cath terminates at the cavoatrial junction. Normal heart size. Normal mediastinal contour. No pneumothorax. New small right pleural effusion. No left pleural effusion. No pulmonary edema. Hazy patchy right lung base opacity. Indistinct tiny nodular opacities scattered in the lower lungs bilaterally. IMPRESSION: 1. New small right pleural effusion. 2. Hazy patchy right lung base opacity, favor atelectasis. 3. Indistinct tiny nodular opacities scattered in the lower lungs bilaterally, as seen on prior chest CT, cannot exclude metastatic disease. Electronically Signed   By: Ilona Sorrel M.D.   On: 12/21/2019 11:40    ASSESSMENT AND PLAN: 1. Pancreas cancer  11/29/2018 CT abdomen/pelvis without contrast-appendix not well  visualized, limited evaluation due to lack of enteric contrast and perineal fat  12/15/2018 CT abdomen/pelvis with contrast-possible 1.5 cm mass within the mid aspect of the pancreatic body with associated atrophy of the upstream pancreatic tail and associated pancreatic ductal dilatation. Soft tissue fullness around the superior  mesenteric artery and celiac axis. Tree-in-bud nodularity within the peripheral aspect of the lower lobes bilaterally.  12/28/2018 upper EUS-2.4 cm x 1.9 cm mass in the body of the pancreas causing main pancreatic duct obstruction and clearly involving the celiac trunk. Heterogeneous soft tissue mass in the neck measuring 3.6 cm. Fine-needle aspiration pancreas with malignant cells consistent with adenocarcinoma.  01/16/2019 Port-A-Cath placement  01/17/2019 PET scan-suspected enlargement of the pancreatic mass with abnormal hypermetabolic activity at the junction of the pancreatic body and tail measuring 3.4 x 2.9 x 2.4 cm, thought to be a high likelihood tumor extension around the vicinity of the celiac trunk and possibly the superior mesenteric artery. Potential mild left periaortic adenopathy maximum SUV of the indistinct left periaortic lymph nodes approximately 3.3 which is above blood pool levels. Scattered groundglass density nodules in the lungs favoring the upper lobes with tree-in-bud nodularity in the lung bases favoring the lower lobes. None of the lesions are hypermetabolic.  Gemcitabine/Abraxane 01/24/2019-08/22/2019  04/16/2019 CT chest/abdomen/pelvis-margins of pancreatic neoplasm difficult to identify. Similar appearance of soft tissue infiltration within the retroperitoneum with progressive narrowing of the portal venous confluence by tumor. No specific findings of solid organ metastasis or nodal metastasis within the abdomen or pelvis. Unchanged appearance of multifocal subsolid nodules within the upper lung zones and bilateral lower lung zone  tree-in-bud nodularity. Right lobe of thyroid gland nodule.  This  CT 08/29/2019-development of intra and extrahepatic biliary duct dilatation with abrupt cut off at the common bile duct, abnormal soft tissue in the retroperitoneum attenuates the portal vein and the splenic vein is occluded, fullness in the head of the pancreas appears more prominent without discrete margins, subtle 12 mm focus in the medial left liver   MRI liver 09/20/2019-heterogeneous hepatic hyperenhancement favored to represent perfusion anomalies in the setting of portal vein narrowing.  No convincing evidence of hepatic metastasis.  Locally advanced pancreatic carcinoma suboptimally evaluated.  Persistent mild intrahepatic biliary duct dilatation.  Subtle peripancreatic edema.  CT chest 12/10/2019-single small embolus over a right lower lobar subsegmental pulmonary artery.  Moderate sized right pleural effusion with associated right basilar atelectasis.  Interval progression of multiple pulmonary nodules likely metastatic disease.  Indeterminate low-density foci over the right lobe of the liver with air-fluid levels.  Pneumobilia and partially visualized biliary stent.  CT abdomen/pelvis 12/25/2019-moderate right and small left pleural effusions.  Some pleural enhancement and thickening in the lower right hemithorax suggestive of malignant pleural involvement.  Numerous pulmonary nodules scattered throughout the visualized lung bases most compatible with progressive metastatic disease. Interval placement of a common bile duct stent, now with substantial intrahepatic biliary ductal dilatation, large amount of pneumatosis and multiple new low-attenuation liver lesions some of which have internal gas fluid levels highly concerning for multiple intra-abdominal abscesses and probable cholangitis.  Profound mural thickening and mucosal hyperenhancement in portions of the colon concerning for colitis. 2. Abdominal/back pain secondary to  #1 3. Port-A-Cath placement 01/16/2019 4. PE on chest CT 12/28/2019.  Anticoagulation on hold due to severe thrombocytopenia. 5. Right pleural effusion on chest CT 01/02/2020-plan for thoracentesis when platelet count improves, fluid for cytology.  Mr. Mccollum appears unchanged.  We will check a CBC, chemistry panel and fibrinogen.  GI consult is pending.  Recommendations: 1. Transfuse platelets for platelet count less than 10,000.   2. Recommend palliative care consult for goals of care discussion.  Seen by Dr. Domingo Cocking. 3. Hold anticoagulation therapy and filter placement for now 4.  We will plan for  a right thoracentesis when platelet count improves.  Fluid will be sent for cytology. 5.  Continue narcotic analgesics for pain 6.  GI consult   LOS: 3 days   Ned Card, AGPCNP-BC 12/26/19 Mr. Mcelreath was interviewed and examined.  His mother was at the bedside.  He appears to have biliary sepsis, likely related to an obstructed biliary stent.  He is scheduled to undergo an ERCP tomorrow.  The platelet count is adequate today.  DIC appears to have improved as the fibrinogen level remains normal.  We will schedule a diagnostic/therapeutic thoracentesis if there is further improvement in the thrombocytopenia.  This is a very difficult situation in this young patient with metastatic colon cancer and a poor performance status.  I do not feel he will be a candidate for further chemotherapy.  We and the palliative care team can begin discussions regarding hospice care. Julieanne Manson, MD

## 2019-12-27 ENCOUNTER — Encounter (HOSPITAL_COMMUNITY): Payer: Self-pay | Admitting: Internal Medicine

## 2019-12-27 ENCOUNTER — Inpatient Hospital Stay (HOSPITAL_COMMUNITY): Payer: Medicaid Other | Admitting: Anesthesiology

## 2019-12-27 ENCOUNTER — Inpatient Hospital Stay (HOSPITAL_COMMUNITY): Payer: Medicaid Other

## 2019-12-27 ENCOUNTER — Encounter (HOSPITAL_COMMUNITY): Admission: EM | Disposition: E | Payer: Self-pay | Source: Home / Self Care | Attending: Family Medicine

## 2019-12-27 HISTORY — PX: ERCP: SHX5425

## 2019-12-27 HISTORY — PX: BILIARY STENT PLACEMENT: SHX5538

## 2019-12-27 LAB — BPAM PLATELET PHERESIS
Blood Product Expiration Date: 202109232359
Blood Product Expiration Date: 202109232359
ISSUE DATE / TIME: 202109221728
ISSUE DATE / TIME: 202109222201
Unit Type and Rh: 6200
Unit Type and Rh: 6200

## 2019-12-27 LAB — CBC WITH DIFFERENTIAL/PLATELET
Abs Immature Granulocytes: 0.03 10*3/uL (ref 0.00–0.07)
Basophils Absolute: 0 10*3/uL (ref 0.0–0.1)
Basophils Relative: 0 %
Eosinophils Absolute: 0 10*3/uL (ref 0.0–0.5)
Eosinophils Relative: 0 %
HCT: 32.4 % — ABNORMAL LOW (ref 39.0–52.0)
Hemoglobin: 10.9 g/dL — ABNORMAL LOW (ref 13.0–17.0)
Immature Granulocytes: 0 %
Lymphocytes Relative: 9 %
Lymphs Abs: 0.7 10*3/uL (ref 0.7–4.0)
MCH: 28.2 pg (ref 26.0–34.0)
MCHC: 33.6 g/dL (ref 30.0–36.0)
MCV: 83.7 fL (ref 80.0–100.0)
Monocytes Absolute: 0.2 10*3/uL (ref 0.1–1.0)
Monocytes Relative: 3 %
Neutro Abs: 7.4 10*3/uL (ref 1.7–7.7)
Neutrophils Relative %: 88 %
Platelets: 43 10*3/uL — ABNORMAL LOW (ref 150–400)
RBC: 3.87 MIL/uL — ABNORMAL LOW (ref 4.22–5.81)
RDW: 14.6 % (ref 11.5–15.5)
WBC: 8.3 10*3/uL (ref 4.0–10.5)
nRBC: 0 % (ref 0.0–0.2)

## 2019-12-27 LAB — COMPREHENSIVE METABOLIC PANEL
ALT: 58 U/L — ABNORMAL HIGH (ref 0–44)
AST: 68 U/L — ABNORMAL HIGH (ref 15–41)
Albumin: 1.9 g/dL — ABNORMAL LOW (ref 3.5–5.0)
Alkaline Phosphatase: 610 U/L — ABNORMAL HIGH (ref 38–126)
Anion gap: 6 (ref 5–15)
BUN: 21 mg/dL — ABNORMAL HIGH (ref 6–20)
CO2: 30 mmol/L (ref 22–32)
Calcium: 7.6 mg/dL — ABNORMAL LOW (ref 8.9–10.3)
Chloride: 105 mmol/L (ref 98–111)
Creatinine, Ser: <0.3 mg/dL — ABNORMAL LOW (ref 0.61–1.24)
Glucose, Bld: 93 mg/dL (ref 70–99)
Potassium: 3.5 mmol/L (ref 3.5–5.1)
Sodium: 141 mmol/L (ref 135–145)
Total Bilirubin: 5.7 mg/dL — ABNORMAL HIGH (ref 0.3–1.2)
Total Protein: 5.3 g/dL — ABNORMAL LOW (ref 6.5–8.1)

## 2019-12-27 LAB — PREPARE PLATELET PHERESIS
Unit division: 0
Unit division: 0

## 2019-12-27 LAB — PROTIME-INR
INR: 1.5 — ABNORMAL HIGH (ref 0.8–1.2)
Prothrombin Time: 17.5 seconds — ABNORMAL HIGH (ref 11.4–15.2)

## 2019-12-27 SURGERY — ERCP, WITH INTERVENTION IF INDICATED
Anesthesia: General

## 2019-12-27 MED ORDER — FENTANYL CITRATE (PF) 100 MCG/2ML IJ SOLN
INTRAMUSCULAR | Status: AC
  Filled 2019-12-27: qty 2

## 2019-12-27 MED ORDER — LIDOCAINE HCL 1 % IJ SOLN
INTRAMUSCULAR | Status: AC
  Filled 2019-12-27: qty 20

## 2019-12-27 MED ORDER — MIDAZOLAM HCL 2 MG/2ML IJ SOLN
INTRAMUSCULAR | Status: AC
  Filled 2019-12-27: qty 2

## 2019-12-27 MED ORDER — INDOMETHACIN 25 MG SUPPOSITORY
RECTAL | Status: DC | PRN
  Administered 2019-12-27: 100 mg via RECTAL

## 2019-12-27 MED ORDER — SODIUM CHLORIDE 0.9 % IV SOLN
INTRAVENOUS | Status: DC | PRN
  Administered 2019-12-27: 40 mL

## 2019-12-27 MED ORDER — PROPOFOL 10 MG/ML IV BOLUS
INTRAVENOUS | Status: DC | PRN
  Administered 2019-12-27: 100 mg via INTRAVENOUS

## 2019-12-27 MED ORDER — SUCCINYLCHOLINE CHLORIDE 200 MG/10ML IV SOSY
PREFILLED_SYRINGE | INTRAVENOUS | Status: DC | PRN
  Administered 2019-12-27: 100 mg via INTRAVENOUS

## 2019-12-27 MED ORDER — FENTANYL CITRATE (PF) 250 MCG/5ML IJ SOLN
INTRAMUSCULAR | Status: DC | PRN
Start: 2019-12-27 — End: 2019-12-27
  Administered 2019-12-27: 100 ug via INTRAVENOUS

## 2019-12-27 MED ORDER — ALBUMIN HUMAN 5 % IV SOLN: INTRAVENOUS | Status: DC | PRN

## 2019-12-27 MED ORDER — GLUCAGON HCL RDNA (DIAGNOSTIC) 1 MG IJ SOLR
INTRAMUSCULAR | Status: AC
  Filled 2019-12-27: qty 1

## 2019-12-27 MED ORDER — LACTATED RINGERS IV SOLN: INTRAVENOUS | Status: DC

## 2019-12-27 MED ORDER — DEXAMETHASONE SODIUM PHOSPHATE 10 MG/ML IJ SOLN
INTRAMUSCULAR | Status: DC | PRN
  Administered 2019-12-27: 10 mg via INTRAVENOUS

## 2019-12-27 MED ORDER — LIDOCAINE 2% (20 MG/ML) 5 ML SYRINGE
INTRAMUSCULAR | Status: DC | PRN
  Administered 2019-12-27: 75 mg via INTRAVENOUS

## 2019-12-27 MED ORDER — INDOMETHACIN 50 MG RE SUPP
RECTAL | Status: AC
  Filled 2019-12-27: qty 2

## 2019-12-27 MED ORDER — PHENYLEPHRINE 40 MCG/ML (10ML) SYRINGE FOR IV PUSH (FOR BLOOD PRESSURE SUPPORT)
PREFILLED_SYRINGE | INTRAVENOUS | Status: DC | PRN
  Administered 2019-12-27 (×2): 120 ug via INTRAVENOUS
  Administered 2019-12-27 (×2): 80 ug via INTRAVENOUS

## 2019-12-27 MED ORDER — MIDAZOLAM HCL 5 MG/5ML IJ SOLN
INTRAMUSCULAR | Status: DC | PRN
  Administered 2019-12-27: 2 mg via INTRAVENOUS

## 2019-12-27 MED ORDER — INDOMETHACIN 50 MG RE SUPP: 100.0000 mg | Freq: Once | RECTAL | Status: DC

## 2019-12-27 MED ORDER — SODIUM CHLORIDE 0.9 % IV SOLN: INTRAVENOUS | Status: DC | PRN

## 2019-12-27 MED ORDER — ONDANSETRON HCL 4 MG/2ML IJ SOLN
INTRAMUSCULAR | Status: DC | PRN
  Administered 2019-12-27: 4 mg via INTRAVENOUS

## 2019-12-27 MED ORDER — ALBUMIN HUMAN 5 % IV SOLN
INTRAVENOUS | Status: AC
  Filled 2019-12-27: qty 250

## 2019-12-27 MED ORDER — PROPOFOL 10 MG/ML IV BOLUS
INTRAVENOUS | Status: AC
  Filled 2019-12-27: qty 20

## 2019-12-27 MED ORDER — SODIUM CHLORIDE 0.9 % IV SOLN
1.0000 g | Freq: Three times a day (TID) | INTRAVENOUS | Status: DC
  Administered 2019-12-27 – 2019-12-28 (×2): 1 g via INTRAVENOUS
  Filled 2019-12-27 (×3): qty 1

## 2019-12-27 MED ORDER — SODIUM CHLORIDE 0.9% IV SOLUTION: Freq: Once | INTRAVENOUS | Status: AC

## 2019-12-27 NOTE — Anesthesia Preprocedure Evaluation (Addendum)
Anesthesia Evaluation  Patient identified by MRN, date of birth, ID band Patient awake    Reviewed: Allergy & Precautions, H&P , NPO status , Patient's Chart, lab work & pertinent test results  Airway Mallampati: III  TM Distance: >3 FB Neck ROM: Full    Dental no notable dental hx. (+) Dental Advisory Given   Pulmonary Current Smoker and Patient abstained from smoking., PE   Pulmonary exam normal        Cardiovascular negative cardio ROS Normal cardiovascular exam     Neuro/Psych    GI/Hepatic Metastatic pancreatic cancer   Endo/Other    Renal/GU      Musculoskeletal   Abdominal   Peds  Hematology   Anesthesia Other Findings   Reproductive/Obstetrics                            Anesthesia Physical  Anesthesia Plan  ASA: III  Anesthesia Plan: General   Post-op Pain Management:    Induction: Intravenous  PONV Risk Score and Plan: 2 and Treatment may vary due to age or medical condition, Midazolam, Ondansetron and Dexamethasone  Airway Management Planned: Oral ETT  Additional Equipment: None  Intra-op Plan:   Post-operative Plan: Extubation in OR  Informed Consent: I have reviewed the patients History and Physical, chart, labs and discussed the procedure including the risks, benefits and alternatives for the proposed anesthesia with the patient or authorized representative who has indicated his/her understanding and acceptance.     Dental advisory given  Plan Discussed with: Anesthesiologist and CRNA  Anesthesia Plan Comments:        Anesthesia Quick Evaluation

## 2019-12-27 NOTE — Plan of Care (Addendum)
  Problem: Education: Goal: Knowledge of General Education information will improve Description: Including pain rating scale, medication(s)/side effects and non-pharmacologic comfort measures Outcome: Progressing   Problem: Health Behavior/Discharge Planning: Goal: Ability to manage health-related needs will improve Outcome: Progressing   Problem: Clinical Measurements: Goal: Ability to maintain clinical measurements within normal limits will improve Outcome: Progressing Goal: Will remain free from infection Outcome: Progressing Goal: Diagnostic test results will improve Outcome: Progressing Goal: Respiratory complications will improve Outcome: Progressing Goal: Cardiovascular complication will be avoided Outcome: Progressing   Problem: Activity: Goal: Risk for activity intolerance will decrease Outcome: Progressing   Problem: Nutrition: Goal: Adequate nutrition will be maintained Outcome: Progressing   Problem: Coping: Goal: Level of anxiety will decrease Outcome: Progressing   Problem: Elimination: Goal: Will not experience complications related to bowel motility Outcome: Progressing Goal: Will not experience complications related to urinary retention Outcome: Progressing   Problem: Pain Managment: Goal: General experience of comfort will improve Outcome: Progressing   Problem: Safety: Goal: Ability to remain free from injury will improve Outcome: Progressing   Problem: Skin Integrity: Goal: Risk for impaired skin integrity will decrease Outcome: Progressing  0650- Awaiting for wife to sign consent for surgery/procedure, called last night and confirmed that she will be [facility] the [facility] to sign the consent as she is the POA.

## 2019-12-27 NOTE — Anesthesia Procedure Notes (Signed)
Procedure Name: Intubation Date/Time: 12/07/2019 11:12 AM Performed by: Lollie Sails, CRNA Pre-anesthesia Checklist: Patient identified, Emergency Drugs available, Suction available, Patient being monitored and Timeout performed Patient Re-evaluated:Patient Re-evaluated prior to induction Oxygen Delivery Method: Circle system utilized Preoxygenation: Pre-oxygenation with 100% oxygen Induction Type: IV induction and Rapid sequence Laryngoscope Size: Miller and 3 Grade View: Grade I Tube type: Oral Tube size: 7.5 mm Number of attempts: 1 Airway Equipment and Method: Stylet Placement Confirmation: ETT inserted through vocal cords under direct vision,  positive ETCO2 and breath sounds checked- equal and bilateral Secured at: 23 cm Tube secured with: Tape Dental Injury: Teeth and Oropharynx as per pre-operative assessment

## 2019-12-27 NOTE — Progress Notes (Signed)
PROGRESS NOTE    Gabriel [name]  ZOX:[id number] DOB: [birth year] DOA: 12/22/2019 PCP: Kerin Perna, NP  Brief Narrative:  39 year old black male Diagnosed with stage Ib [id number] pancreatic cancer 910 2021.5 cm mass-EUS 9/24 2.5 cm mass biopsy = adenocarcinoma-not a candidate for surgery due to SMA involvement-underwent palliative gemcitabine and Abraxane- Previous ERCP with Dr. Ardis Hughs tight mid biliary structure 6 cm / 10 cm uncovered biliary stent was placed in June 2021 he has been scheduled for radiation therapy presented with decreased appetite generalized weakness and was altered and confused on admission 12/21/2019 Notable labs BUNs/creatinine 51/0.5 AST/ALT 62/61 alk phos 471 WBC 38 Platelets were 11,000 on admission CT chest small subsegmental PE moderate right-sided effusion CT abdomen pelvis multiple no attenuating lesions?  Hepatic abscesses 3.6 X3.5 Sepsis from biliary versus urinary source entertained GI consulted ERCP performed 9/23 as below  Assessment & Plan:   Principal Problem:   Sepsis secondary to UTI [facility]) Active Problems:   Malignant neoplasm of head of pancreas ([place])   Cancer-related pain   Failure to thrive in adult   Pulmonary embolism (HCC)   Pleural effusion   Pressure injury of skin   1. Sepsis on admission with hepatic abscesses +cholangitis?  Growing Klebsiella in urine-blood cultures not done on admission a. Continue empiric meropenem do not narrow based on urine culture as this is unlikely to be the source b. Extensive ERCP and instrumentation with plastic stent placement 9/23 Dr. Ardis Hughs shows significant purulence c. Further planning on imaging as per GI 2. Probable metastatic pancreatic cancer on palliative gemcitabine Abraxane SBRT a. Further planning re: chemoradiation to oncology b. CT chest showed pulmonary mets small pleural effusion and retroperitoneal lymphadenopathy c. May require goals of care discussions?-Palliative to  revisit 3. Small subsegmental pulmonary embolism a. Not really a candidate for anticoagulation at this time-May be able to be anticoagulated when platelets are above 50,000 b. Monitor and discuss as an outpatient 4. Moderate right pleural effusion a. Right pleural tap performed 9/23 await results b. Defer to oncology at this time not requiring any oxygen c. We will repeat chest x-ray in the am  5. Severe thrombocytopenia a. Platelet counts coming up and improving probably secondary to resolution of severe sepsis with probable DIC b. See above  DVT prophylaxis: SCD Code Status: Full code at this time Family Communication:  Disposition:   Status is: Inpatient  Remains inpatient appropriate because:Persistent severe electrolyte disturbances, Altered mental status, Ongoing diagnostic testing needed not appropriate for outpatient work up and IV treatments appropriate due to intensity of illness or inability to take PO   Dispo: The patient is from: Home              Anticipated d/c is to: Unclear unclear              Anticipated d/c date is: > 3 days              Patient currently is not medically stable to d/c.  Disposition is unclear-patient has advanced disease and it is unclear if he has further options he will need further discussion with his oncologist regarding further planning     Consultants:   Dr. Benay Spice oncology  Dr. Ardis Hughs gastroenterology  Procedures: ERCP 9/23 Impression:               - Extensive tissue ingrowth in the previously  placed metal biliary stent.                           - Near complete blockage of the proixmal aspect of                            the metal bilairy stent, probably from tissue                            ingrowth.                           - Two of the suspected liver abscesses were evident                            on cholangiogram, with apparent direct                            communication with the  right biliary system.                           - Significant purulence noted throughout the                            examination.                           The above was treated with biliary balloon sweeping                            and then eventual placement of a 9cm long 10Fr                            plastic biliary stent through the existing metal                            stent and into the right biliary system with the                            proximal aspect of the stent located in one of the                            presumed liver abscesses and the distal end located                            in the duodenum. It is not clear if this will help                            resolve all of the infection in his liver.  Antimicrobials: Meropenem  Subjective:  very sleepy from multiple procedures nursing reports sinus brady on monitors  Objective: Vitals:   12/05/2019 1300 12/30/2019 1303 12/26/2019 1310 12/10/2019 1320  BP: (!) 153/88  (!) 153/97 (!) 150/90  Pulse: (!) 46  (!) 55 (!) 54  Resp: (!) 9  14 16  Temp:      TempSrc:      SpO2: 100% 100% 100% 100%  Weight:      Height:        Intake/Output Summary (Last 24 hours) at 01/03/2020 1424 Last data filed at 12/12/2019 1255 Gross per 24 hour  Intake 5059.02 ml  Output 805 ml  Net 4254.02 ml   Filed Weights   12/24/19 1440  Weight: 39.6 kg    Examination:  sleepy eomi icteric abd soft nt  cta b  Data Reviewed: I have personally reviewed following labs and imaging studies  Sodium 141 BUN/creatinine 21/0.30 Calcium 7.6 Alk phos 69 AST/ALT 68/58 WBC 8.3 Hemoglobin 10.9 Platelet 43  Radiology Studies: CT ABDOMEN PELVIS W CONTRAST  Result Date: 12/25/2019 CLINICAL DATA:  39 year old male with history of abdominal pain. Suspected abscess. EXAM: CT ABDOMEN AND PELVIS WITH CONTRAST TECHNIQUE: Multidetector CT imaging of the abdomen and pelvis was performed using the standard protocol following bolus  administration of intravenous contrast. CONTRAST:  59m OMNIPAQUE IOHEXOL 300 MG/ML  SOLN COMPARISON:  CT the abdomen and pelvis 08/29/2019. FINDINGS: Lower chest: Moderate right and small left pleural effusions lying dependently. Some pleural enhancement and thickening in the lower right hemithorax suggestive of malignant pleural involvement. Numerous pulmonary nodules scattered throughout the visualize lung bases, including areas of confluent nodularity in the lower lobes of the lungs bilaterally, most compatible with progressive metastatic disease. Hepatobiliary: Compared to the prior study there are numerous low-attenuation lesions within the liver, highly suspicious for multifocal intrahepatic abscesses (less likely to represent centrally necrotic metastatic lesions). The largest of these has internal septations and measures up to 3.6 x 3.5 cm in segment 5 (axial image 30 of series 2). One of these lesions in segment 7/8 of the liver has some non dependent gas, strongly favoring abscess. Extensive intrahepatic biliary ductal dilatation with a large volume of pneumobilia. Gallbladder is unremarkable in appearance. Interval placement of common bile duct stent which appears appropriately located. Pancreas: Poorly defined pancreatic mass in the superior aspect of the head of the pancreas (soft tissues in the superior aspect of the head of the pancreas are somewhat indistinct, potentially treatment related). Haziness in the peripancreatic fat, including soft tissue around portions of the celiac axis, common hepatic artery and proximal superior mesenteric artery, as well as the left renal vein, all of which appear narrowed, likely indicative of locally infiltrative tumor. This lesion is difficult to definitively identify and measure, but is estimated to measure approximately 4.4 x 3.0 cm (axial image 27 of series 2). Spleen: The appearance of the spleen is unremarkable. Adrenals/Urinary Tract: Bilateral kidneys and  adrenal glands are normal in appearance. No hydroureteronephrosis. Urinary bladder is unremarkable in appearance. Stomach/Bowel: The appearance of the stomach is unremarkable. No pathologic dilatation of small bowel or colon. Portions of the colon appear thickened with some mucosal hyperenhancement (poorly evaluated given the adjacent ascites), best appreciated in the region of the cecum, ascending colon and hepatic flexure, suggestive of underlying colitis. Appendix is not confidently identified. Vascular/Lymphatic: Aortic atherosclerosis, without evidence of aneurysm or dissection in the abdominal or pelvic vasculature. Vascular involvement from the pancreatic mass, discussed above. Poorly defined infiltrative soft tissue throughout the retroperitoneum adjacent to the pancreatic mass, with soft tissue completely encircling the upper abdominal aorta immediately below the level of the renal arteries (axial image 30 of series 2), presumably indicative of lymphatic spread of disease. Reproductive: Prostate gland and seminal vesicles are unremarkable in appearance. Other:  Large volume of ascites. No pneumoperitoneum. Diffuse body wall edema. Musculoskeletal: There are no aggressive appearing lytic or blastic lesions noted in the visualized portions of the skeleton. IMPRESSION: 1. Today's study demonstrates what appears to be progression of disease with innumerable metastatic lesions throughout the visualize lung bases, probable malignant moderate right pleural effusion and small left pleural effusion, enlargement of the primary pancreatic mass now with vascular involvement, and what appears to be evolving retroperitoneal lymphadenopathy encasing the abdominal aorta. 2. In addition, there has been interval placement of a common bile duct stent, now with substantial intrahepatic biliary ductal dilatation, large amount of pneumatosis, and multiple new low-attenuation liver lesions, some of which have internal gas fluid  levels, highly concerning for multiple intra-abdominal abscesses and probable cholangitis. 3. Profound mural thickening and mucosal hyperenhancement in portions of the colon (predominantly cecum, ascending colon and hepatic flexure), concerning for colitis. 4. Aortic atherosclerosis. 5. Additional incidental findings, as above. Electronically Signed   By: Vinnie Langton M.D.   On: 12/25/2019 14:50   DG ERCP  Result Date: 12/22/2019 CLINICAL DATA:  Pancreas cancer, cholangitis, sepsis, stent occlusion EXAM: ERCP with common bile duct stent revision TECHNIQUE: Multiple spot images obtained with the fluoroscopic device and submitted for interpretation post-procedure. FLUOROSCOPY TIME:  Fluoroscopy Time:  10 minutes 37 seconds Radiation Exposure Index (if provided by the fluoroscopic device): Dense 110 mGy Number of Acquired Spot Images: Numerous spot fluoroscopic view COMPARISON:  12/25/2019 FINDINGS: Spot fluoroscopic views during ERCP confirm wire and catheter traversing the biliary metallic stent. Proximal contrast injection above the stent confirms biliary obstruction from stent occlusion. Endoscopic plastic biliary stent now traverses the previous metallic stent. 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 Successful plastic endoscopic stent placement traversing the proximal biliary tree through the CBD metallic stent. These images were submitted for radiologic interpretation only. Please see the procedural report for the amount of contrast and the fluoroscopy time utilized. Electronically Signed   By: Jerilynn Mages.  Shick M.D.   On: 12/11/2019 13:05     Scheduled Meds: . [MAR Hold] (feeding supplement) PROSource Plus  30 mL Oral TID BM  . [MAR Hold] sodium chloride   Intravenous Once  . [MAR Hold] Chlorhexidine Gluconate Cloth  6 each Topical Daily  . [MAR Hold] feeding supplement  1 Container Oral TID BM  . indomethacin  100 mg Rectal Once  . [MAR Hold] morphine  60 mg  Oral Q12H  . [MAR Hold] multivitamin with minerals  1 tablet Oral Daily  . [MAR Hold] nystatin  5 mL Oral QID  . [MAR Hold] sodium chloride flush  3 mL Intravenous Q12H   Continuous Infusions: . dextrose 5 % and 0.9% NaCl 60 mL/hr at 01/03/2020 0031  . lactated ringers 0  (12/18/2019 1137)  . [MAR Hold] meropenem (MERREM) IV 1 g (01/01/2020 0504)     LOS: 4 days    Time spent: [place], MD [facility] To contact the attending provider between 7A-7P or the covering provider during after hours 7P-7A, please log into the web site [email or web address] and access using universal [place] password for that web site. If you do not have the password, please call the hospital operator.  12/26/2019, 2:24 PM

## 2019-12-27 NOTE — Procedures (Signed)
PROCEDURE SUMMARY:  Successful image-guided right thoracentesis. Yielded 850 milliliters of dark red fluid. Patient tolerated procedure well. No immediate complications. EBL < 1 mL.  Specimen was sent for labs. CXR ordered.  Please see imaging section of Epic for full dictation.   Earley Abide PA-C 12/14/2019 3:41 PM

## 2019-12-27 NOTE — Anesthesia Postprocedure Evaluation (Signed)
Anesthesia Post Note  Patient: Gabriel [name]  Procedure(s) Performed: ENDOSCOPIC RETROGRADE CHOLANGIOPANCREATOGRAPHY (ERCP) (N/A ) BILIARY STENT PLACEMENT (N/A )     Patient location during evaluation: Endoscopy Anesthesia Type: General Level of consciousness: sedated Pain management: pain level controlled Vital Signs Assessment: post-procedure vital signs reviewed and stable Respiratory status: spontaneous breathing and respiratory function stable Cardiovascular status: stable Postop Assessment: no apparent nausea or vomiting Anesthetic complications: no   No complications documented.  Last Vitals:  Vitals:   12/17/2019 1310 12/05/2019 1320  BP: (!) 153/97 (!) 150/90  Pulse: (!) 55 (!) 54  Resp: 14 16  Temp:    SpO2: 100% 100%    Last Pain:  Vitals:   12/12/2019 1310  TempSrc:   PainSc: Asleep                 Gertrude Tarbet DANIEL

## 2019-12-27 NOTE — Progress Notes (Addendum)
HEMATOLOGY-ONCOLOGY PROGRESS NOTE  SUBJECTIVE: States that he has little bit more energy this morning.  Denies abdominal pain.  No nausea or vomiting reported.  For ERCP later today.  Oncology History  Malignant neoplasm of head of pancreas [facility])   Initial Diagnosis   Pancreatic adenocarcinoma ([place])   12/15/2018 Imaging   CT abdomen/pelvis w/ contrast: IMPRESSION: 1. Suggestion of a 1.5 cm mass within the mid aspect of the pancreatic body with associated atrophy of the upstream pancreatic tail and associated pancreatic ductal dilatation. Recommend further evaluation with MRI. There is soft tissue fullness around the superior mesenteric artery and celiac axis. This may represent edema or potentially tumor involvement. 2. Tree-in-bud nodularity within the peripheral aspect of the lower lobes bilaterally which may be secondary to an infectious or inflammatory process. Recommend follow-up chest CT in 3 months to assess for interval change. 3. These results will be called to the ordering clinician or representative by the Radiologist Assistant, and communication documented in the PACS or zVision Dashboard.   12/29/2018 Procedure   EUS: -2.4 x 1.9cm mass in the body of the pancreas causing the main pancreatic duct obstruction and clearly involving the celiac trunk. Biopsied. -Heterogenous soft tissue mass in the neck, measuring 3.6cm maximally. This was not sampled, presumed to be a large thyroid gland.  -No peripancreatic adenopathy -CBD was normal, non-dilated.   12/29/2018 Pathology Results   CASE: [id number]   DIAGNOSIS:  - Malignant cells consistent with adenocarcinoma  - See comment.   01/17/2019 Imaging   PET: IMPRESSION: 1. Suspected enlargement of the pancreatic mass, with abnormal hypermetabolic activity at the junction of the pancreatic body and tail currently measuring 3.4 by 2.9 by 2.4 cm. Although anatomic localization is hampered by the patient's extreme paucity  of adipose tissue (making separation of adjacent structures difficult on noncontrast CT), there is thought to be a high likelihood tumor extending around the vicinity of the celiac trunk and possibly the superior mesenteric artery. In this case, MRI might provide better anatomic localization with respect to perivascular involvement. 2. Potential mild left periaortic adenopathy, maximum SUV of the indistinct left periaortic lymph nodes is approximately 3.3 which is above blood pool levels. 3. Scattered ground-glass density nodules in the lungs favoring the upper lobes, with tree-in-bud nodularity in the lung bases favoring the lower lobes. None of these lesions are hypermetabolic. While possibilities include atypical infectious process drug reaction, surveillance of the chest is recommended to exclude low-grade adenocarcinoma. 4.  Aortic Atherosclerosis (ICD10-I70.0).   01/24/2019 -  Chemotherapy   The patient had PACLitaxel-protein bound (ABRAXANE) chemo infusion 150 mg, 100 mg/m2 = 150 mg (100 % of original dose 100 mg/m2), Intravenous,  Once, 6 of 8 cycles Dose modification: 100 mg/m2 (original dose 100 mg/m2, Cycle 1, Reason: Provider Judgment) Administration: 150 mg (01/24/2019), 150 mg (02/14/2019), 150 mg (03/02/2019), 150 mg (03/16/2019), 150 mg (04/26/2019), 150 mg (07/04/2019), 150 mg (07/18/2019), 150 mg (05/09/2019), 150 mg (05/23/2019), 150 mg (08/08/2019), 150 mg (08/22/2019), 150 mg (06/22/2019) gemcitabine (GEMZAR) 1,102 mg in sodium chloride 0.9 % 250 mL chemo infusion, 800 mg/m2 = 1,102 mg (100 % of original dose 800 mg/m2), Intravenous,  Once, 6 of 8 cycles Dose modification: 800 mg/m2 (original dose 800 mg/m2, Cycle 1, Reason: Provider Judgment), 1,000 mg/m2 (original dose 800 mg/m2, Cycle 5, Reason: Provider Judgment) Administration: 1,102 mg (01/24/2019), 1,102 mg (02/14/2019), 1,102 mg (03/02/2019), 1,102 mg (03/16/2019), 1,102 mg (04/26/2019), 1,406 mg (07/04/2019), 1,406 mg  (07/18/2019), 1,406 mg (05/09/2019), 1,406 mg (05/23/2019),  1,406 mg (08/08/2019), 1,406 mg (08/22/2019), 1,406 mg (06/22/2019)  for chemotherapy treatment.    02/28/2019 Cancer Staging   Staging form: Exocrine Pancreas, AJCC 8th Edition - Clinical: Stage IB (cT2, cN0, cM0) - Signed by Tish Men, MD on 02/28/2019   04/16/2019 Imaging   CT CAP: IMPRESSION: 1. Margins of pancreatic neoplasm are difficult to identify. Similar appearance of soft tissue infiltration within the retroperitoneum with progressive narrowing of the portal venous confluence by tumor. No specific findings of solid organ metastasis or nodal metastasis within the abdomen or pelvis. 2. Unchanged appearance of multifocal sub solid nodules within the upper lung zones and bilateral lower lung zone tree-in-bud nodularity. As mentioned previously findings are nonspecific and may be inflammatory or infectious in etiology. Metastatic disease is not excluded. 3. Aortic atherosclerosis. 4. Right lobe of thyroid gland nodule. Consider further evaluation with thyroid ultrasound. If patient is clinically hyperthyroid, consider nuclear medicine thyroid uptake and scan.    PHYSICAL EXAMINATION:  Vitals:   12/25/2019 0927 12/09/2019 0956  BP: (!) 131/93 (!) 148/87  Pulse: 61 61  Resp: 14 18  Temp: (!) 97.4 F (36.3 C) 97.9 F (36.6 C)  SpO2:  99%   Filed Weights   12/24/19 1440  Weight: 39.6 kg    Intake/Output from previous day: 09/22 0701 - 09/23 0700 In: 3962 [P.O.:1000; I.V.:1512.8; Blood:1149.3; IV Piggyback:300] Out: 1400 [Urine:1400]  GENERAL: Alert, cachectic, no distress; minimal verbal interaction HEENT: Mild scleral icterus.  No thrush. SKIN: Small scattered ecchymoses. LUNGS: Breath sounds diminished on the right side anteriorly.  No respiratory distress. HEART: regular rate & rhythm  ABDOMEN: Mild generalized tenderness. Vascular: Pitting edema at the lower legs bilaterally. NEURO: Follows  commands  LABORATORY DATA:  I have reviewed the data as listed CMP Latest Ref Rng & Units 12/31/2019 12/26/2019 12/25/2019  Glucose 70 - 99 mg/dL 93 173(H) -  BUN 6 - 20 mg/dL 21(H) 28(H) -  Creatinine 0.61 - 1.24 mg/dL <0.30(L) <0.30(L) -  Sodium 135 - 145 mmol/L 141 139 -  Potassium 3.5 - 5.1 mmol/L 3.5 3.6 -  Chloride 98 - 111 mmol/L 105 102 -  CO2 22 - 32 mmol/L 30 28 -  Calcium 8.9 - 10.3 mg/dL 7.6(L) 7.8(L) -  Total Protein 6.5 - 8.1 g/dL 5.3(L) 6.1(L) 6.0(L)  Total Bilirubin 0.3 - 1.2 mg/dL 5.7(H) 5.6(H) 3.3(H)  Alkaline Phos 38 - 126 U/L 610(H) 634(H) 469(H)  AST 15 - 41 U/L 68(H) 69(H) 44(H)  ALT 0 - 44 U/L 58(H) 62(H) 49(H)    Lab Results  Component Value Date   WBC 8.3 12/11/2019   HGB 10.9 (L) 12/17/2019   HCT 32.4 (L) 12/18/2019   MCV 83.7 12/15/2019   PLT 43 (L) 01/03/2020   NEUTROABS 7.4 12/28/2019    CT Head Wo Contrast  Result Date: 12/23/2019 CLINICAL DATA:  Pancreatic cancer, chemotherapy, weakness, headache EXAM: CT HEAD WITHOUT CONTRAST TECHNIQUE: Contiguous axial images were obtained from the base of the skull through the vertex without intravenous contrast. COMPARISON:  None. FINDINGS: Brain: No evidence of acute infarction, hemorrhage, hydrocephalus, extra-axial collection or mass lesion/mass effect. Vascular: No hyperdense vessel or unexpected calcification. Skull: Normal. Negative for fracture or focal lesion. Sinuses/Orbits: No acute finding. Other: None. IMPRESSION: No acute intracranial pathology. No non-contrast CT evidence of intracranial metastatic disease or findings to explain headache. Contrast enhanced MRI may be used to more sensitively evaluate for intracranial metastatic disease if desired. Electronically Signed   By: Dorna Bloom.D.  On: 12/18/2019 11:30   CT Angio Chest PE W and/or Wo Contrast  Result Date: 12/14/2019 CLINICAL DATA:  Shortness of breath and chest pain. Suspect pulmonary embolism. History of pancreatic cancer on  chemotherapy. EXAM: CT ANGIOGRAPHY CHEST WITH CONTRAST TECHNIQUE: Multidetector CT imaging of the chest was performed using the standard protocol during bolus administration of intravenous contrast. Multiplanar CT image reconstructions and MIPs were obtained to evaluate the vascular anatomy. CONTRAST:  179mL OMNIPAQUE IOHEXOL 350 MG/ML SOLN COMPARISON:  08/29/2019 FINDINGS: Cardiovascular: Right IJ central venous catheter is present with tip over the cavoatrial junction. Heart is normal size. Thoracic aorta is normal caliber. Pulmonary arterial system is well opacified and demonstrates a single small embolus over a right lower lobar subsegmental pulmonary artery. No left-sided pulmonary emboli. Mediastinum/Nodes: No evidence of mediastinal or hilar adenopathy. Remaining mediastinal structures are unremarkable. Lungs/Pleura: Lungs are adequately inflated demonstrate a moderate size right pleural effusion with associated atelectasis in the right base. There are numerous small bilateral pulmonary nodules with interval progression likely worsening metastatic disease. Airways are unremarkable. Upper Abdomen: Not well evaluated on this arterial phase CT scan. Suggestion of partial visualization of patient's known biliary stent. Moderate pneumobilia is present. Couple low-density foci over the right lobe of the liver with air-fluid levels of uncertain clinical significance. Musculoskeletal: No focal abnormality. Review of the MIP images confirms the above findings. IMPRESSION: 1. Single small embolus over a right lower lobar subsegmental pulmonary artery. 2. Moderate size right pleural effusion with associated right basilar atelectasis. 3. Interval progression of multiple pulmonary nodules likely metastatic disease due to patient's known pancreatic cancer. 4. Couple indeterminate low-density foci over the right lobe of the liver with air-fluid levels of not well evaluated on this arterial phase contrast CT. Pneumobilia and  partially visualized biliary stent. Recommend clinical correlation as further evaluation with venous phase contrast enhanced CT of the abdomen/pelvis would be helpful. Critical Value/emergent results were called by telephone at the time of interpretation on 12/08/2019 at 3:08 pm to provider [facility] RAY , who verbally acknowledged these results. Electronically Signed   By: Marin Olp M.D.   On: 01/03/2020 15:10   CT ABDOMEN PELVIS W CONTRAST  Result Date: 12/25/2019 CLINICAL DATA:  39 year old male with history of abdominal pain. Suspected abscess. EXAM: CT ABDOMEN AND PELVIS WITH CONTRAST TECHNIQUE: Multidetector CT imaging of the abdomen and pelvis was performed using the standard protocol following bolus administration of intravenous contrast. CONTRAST:  76mL OMNIPAQUE IOHEXOL 300 MG/ML  SOLN COMPARISON:  CT the abdomen and pelvis 08/29/2019. FINDINGS: Lower chest: Moderate right and small left pleural effusions lying dependently. Some pleural enhancement and thickening in the lower right hemithorax suggestive of malignant pleural involvement. Numerous pulmonary nodules scattered throughout the visualize lung bases, including areas of confluent nodularity in the lower lobes of the lungs bilaterally, most compatible with progressive metastatic disease. Hepatobiliary: Compared to the prior study there are numerous low-attenuation lesions within the liver, highly suspicious for multifocal intrahepatic abscesses (less likely to represent centrally necrotic metastatic lesions). The largest of these has internal septations and measures up to 3.6 x 3.5 cm in segment 5 (axial image 30 of series 2). One of these lesions in segment 7/8 of the liver has some non dependent gas, strongly favoring abscess. Extensive intrahepatic biliary ductal dilatation with a large volume of pneumobilia. Gallbladder is unremarkable in appearance. Interval placement of common bile duct stent which appears appropriately located.  Pancreas: Poorly defined pancreatic mass in the superior aspect of the head  of the pancreas (soft tissues in the superior aspect of the head of the pancreas are somewhat indistinct, potentially treatment related). Haziness in the peripancreatic fat, including soft tissue around portions of the celiac axis, common hepatic artery and proximal superior mesenteric artery, as well as the left renal vein, all of which appear narrowed, likely indicative of locally infiltrative tumor. This lesion is difficult to definitively identify and measure, but is estimated to measure approximately 4.4 x 3.0 cm (axial image 27 of series 2). Spleen: The appearance of the spleen is unremarkable. Adrenals/Urinary Tract: Bilateral kidneys and adrenal glands are normal in appearance. No hydroureteronephrosis. Urinary bladder is unremarkable in appearance. Stomach/Bowel: The appearance of the stomach is unremarkable. No pathologic dilatation of small bowel or colon. Portions of the colon appear thickened with some mucosal hyperenhancement (poorly evaluated given the adjacent ascites), best appreciated in the region of the cecum, ascending colon and hepatic flexure, suggestive of underlying colitis. Appendix is not confidently identified. Vascular/Lymphatic: Aortic atherosclerosis, without evidence of aneurysm or dissection in the abdominal or pelvic vasculature. Vascular involvement from the pancreatic mass, discussed above. Poorly defined infiltrative soft tissue throughout the retroperitoneum adjacent to the pancreatic mass, with soft tissue completely encircling the upper abdominal aorta immediately below the level of the renal arteries (axial image 30 of series 2), presumably indicative of lymphatic spread of disease. Reproductive: Prostate gland and seminal vesicles are unremarkable in appearance. Other: Large volume of ascites. No pneumoperitoneum. Diffuse body wall edema. Musculoskeletal: There are no aggressive appearing lytic or  blastic lesions noted in the visualized portions of the skeleton. IMPRESSION: 1. Today's study demonstrates what appears to be progression of disease with innumerable metastatic lesions throughout the visualize lung bases, probable malignant moderate right pleural effusion and small left pleural effusion, enlargement of the primary pancreatic mass now with vascular involvement, and what appears to be evolving retroperitoneal lymphadenopathy encasing the abdominal aorta. 2. In addition, there has been interval placement of a common bile duct stent, now with substantial intrahepatic biliary ductal dilatation, large amount of pneumatosis, and multiple new low-attenuation liver lesions, some of which have internal gas fluid levels, highly concerning for multiple intra-abdominal abscesses and probable cholangitis. 3. Profound mural thickening and mucosal hyperenhancement in portions of the colon (predominantly cecum, ascending colon and hepatic flexure), concerning for colitis. 4. Aortic atherosclerosis. 5. Additional incidental findings, as above. Electronically Signed   By: Vinnie Langton M.D.   On: 12/25/2019 14:50   DG Chest Port 1 View  Result Date: 12/14/2019 CLINICAL DATA:  Pancreatic cancer, weakness EXAM: PORTABLE CHEST 1 VIEW COMPARISON:  08/29/2019 chest CT. FINDINGS: Right internal jugular Port-A-Cath terminates at the cavoatrial junction. Normal heart size. Normal mediastinal contour. No pneumothorax. New small right pleural effusion. No left pleural effusion. No pulmonary edema. Hazy patchy right lung base opacity. Indistinct tiny nodular opacities scattered in the lower lungs bilaterally. IMPRESSION: 1. New small right pleural effusion. 2. Hazy patchy right lung base opacity, favor atelectasis. 3. Indistinct tiny nodular opacities scattered in the lower lungs bilaterally, as seen on prior chest CT, cannot exclude metastatic disease. Electronically Signed   By: Ilona Sorrel M.D.   On: 01/01/2020 11:40     ASSESSMENT AND PLAN: 1. Pancreas cancer  11/29/2018 CT abdomen/pelvis without contrast-appendix not well visualized, limited evaluation due to lack of enteric contrast and perineal fat  12/15/2018 CT abdomen/pelvis with contrast-possible 1.5 cm mass within the mid aspect of the pancreatic body with associated atrophy of the upstream pancreatic tail and  associated pancreatic ductal dilatation. Soft tissue fullness around the superior mesenteric artery and celiac axis. Tree-in-bud nodularity within the peripheral aspect of the lower lobes bilaterally.  12/28/2018 upper EUS-2.4 cm x 1.9 cm mass in the body of the pancreas causing main pancreatic duct obstruction and clearly involving the celiac trunk. Heterogeneous soft tissue mass in the neck measuring 3.6 cm. Fine-needle aspiration pancreas with malignant cells consistent with adenocarcinoma.  01/16/2019 Port-A-Cath placement  01/17/2019 PET scan-suspected enlargement of the pancreatic mass with abnormal hypermetabolic activity at the junction of the pancreatic body and tail measuring 3.4 x 2.9 x 2.4 cm, thought to be a high likelihood tumor extension around the vicinity of the celiac trunk and possibly the superior mesenteric artery. Potential mild left periaortic adenopathy maximum SUV of the indistinct left periaortic lymph nodes approximately 3.3 which is above blood pool levels. Scattered groundglass density nodules in the lungs favoring the upper lobes with tree-in-bud nodularity in the lung bases favoring the lower lobes. None of the lesions are hypermetabolic.  Gemcitabine/Abraxane 01/24/2019-08/22/2019  04/16/2019 CT chest/abdomen/pelvis-margins of pancreatic neoplasm difficult to identify. Similar appearance of soft tissue infiltration within the retroperitoneum with progressive narrowing of the portal venous confluence by tumor. No specific findings of solid organ metastasis or nodal metastasis within the abdomen or pelvis.  Unchanged appearance of multifocal subsolid nodules within the upper lung zones and bilateral lower lung zone tree-in-bud nodularity. Right lobe of thyroid gland nodule.  This  CT 08/29/2019-development of intra and extrahepatic biliary duct dilatation with abrupt cut off at the common bile duct, abnormal soft tissue in the retroperitoneum attenuates the portal vein and the splenic vein is occluded, fullness in the head of the pancreas appears more prominent without discrete margins, subtle 12 mm focus in the medial left liver   MRI liver 09/20/2019-heterogeneous hepatic hyperenhancement favored to represent perfusion anomalies in the setting of portal vein narrowing.  No convincing evidence of hepatic metastasis.  Locally advanced pancreatic carcinoma suboptimally evaluated.  Persistent mild intrahepatic biliary duct dilatation.  Subtle peripancreatic edema.  CT chest 12/08/2019-single small embolus over a right lower lobar subsegmental pulmonary artery.  Moderate sized right pleural effusion with associated right basilar atelectasis.  Interval progression of multiple pulmonary nodules likely metastatic disease.  Indeterminate low-density foci over the right lobe of the liver with air-fluid levels.  Pneumobilia and partially visualized biliary stent.  CT abdomen/pelvis 12/25/2019-moderate right and small left pleural effusions.  Some pleural enhancement and thickening in the lower right hemithorax suggestive of malignant pleural involvement.  Numerous pulmonary nodules scattered throughout the visualized lung bases most compatible with progressive metastatic disease. Interval placement of a common bile duct stent, now with substantial intrahepatic biliary ductal dilatation, large amount of pneumatosis and multiple new low-attenuation liver lesions some of which have internal gas fluid levels highly concerning for multiple intra-abdominal abscesses and probable cholangitis.  Profound mural thickening and  mucosal hyperenhancement in portions of the colon concerning for colitis. 2. Abdominal/back pain secondary to #1 3. Port-A-Cath placement 01/16/2019 4. PE on chest CT 12/17/2019.  Anticoagulation on hold due to severe thrombocytopenia. 5. Right pleural effusion on chest CT 12/05/2019-plan for thoracentesis when platelet count improves, fluid for cytology.  Mr. Bohlken appears unchanged.  Receiving FFP and platelets per GI prior to ERCP.  Leukocytosis has resolved, anemia overall stable, and platelets improved although has received platelet transfusion.  T bili remains elevated but is stable today.  Recommendations: 1. Transfuse platelets for platelet count less than 10,000.   2.  Appreciate palliative care consult for goals of care discussion.  They plan to continue to follow-up for ongoing discussion regarding goals of care. 3. Hold anticoagulation therapy and filter placement for now 4.  Ultrasound-guided thoracentesis ordered for today or tomorrow.  Fluid will be sent for cytology. 5.  Continue narcotic analgesics for pain 6.  ERCP per GI today.   LOS: 4 days   Mikey Bussing, AGPCNP-BC 12/26/2019  Mr. Bethel was interviewed and examined.  He appears stable.  His mother was at the bedside.  I spoke with his fiance in the hall.  The platelet count is higher after receiving platelets last night.  The liver enzymes and bilirubin remain elevated.  He is scheduled for an ERCP today.  We will request a right thoracentesis with fluid to be sent for cytology.  Julieanne Manson, MD

## 2019-12-27 NOTE — Interval H&P Note (Signed)
History and Physical Interval Note:  12/17/2019 10:37 AM  Gabriel [name]  has presented today for surgery, with the diagnosis of Pancreatic cancer, sepsis, possible cholangitis, rule out stent occlusion.  The various methods of treatment have been discussed with the patient and family. After consideration of risks, benefits and other options for treatment, the patient has consented to  Procedure(s): ENDOSCOPIC RETROGRADE CHOLANGIOPANCREATOGRAPHY (ERCP) (N/A) as a surgical intervention.  The patient's history has been reviewed, patient examined, no change in status, stable for surgery.  I have reviewed the patient's chart and labs.  Questions were answered to the patient's satisfaction.     Milus Banister

## 2019-12-27 NOTE — Transfer of Care (Signed)
Immediate Anesthesia Transfer of Care Note  Patient: Gabriel [name]  Procedure(s) Performed: ENDOSCOPIC RETROGRADE CHOLANGIOPANCREATOGRAPHY (ERCP) (N/A ) BILIARY STENT PLACEMENT (N/A )  Patient Location: PACU and Endoscopy Unit  Anesthesia Type:General  Level of Consciousness: sedated  Airway & Oxygen Therapy: Patient Spontanous Breathing and Patient connected to face mask oxygen  Post-op Assessment: Report given to RN and Post -op Vital signs reviewed and stable  Post vital signs: Reviewed and stable  Last Vitals:  Vitals Value Taken Time  BP 141/88 12/06/2019 1252  Temp    Pulse 46 12/06/2019 1255  Resp 9 12/09/2019 1255  SpO2 100 % 01/01/2020 1255  Vitals shown include unvalidated device data.  Last Pain:  Vitals:   12/17/2019 0956  TempSrc: Oral  PainSc: 0-No pain      Patients Stated Pain Goal: 0 (20/81/38 8719)  Complications: No complications documented.

## 2019-12-27 NOTE — Op Note (Signed)
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 Estimated blood loss:                            None Estimated Blood Loss:     Estimated blood loss: none. Procedure:                Pre-Anesthesia Assessment:                           - Prior to the procedure, a History and Physical                            was performed, and patient medications and                            allergies were reviewed. The patient's tolerance of                            previous anesthesia was also reviewed. The risks                            and benefits of the procedure and the sedation                            options and risks were discussed with the patient.                            All questions were answered, and informed consent                            was obtained.  Prior Anticoagulants: The patient has                            taken no previous anticoagulant or antiplatelet                            agents. ASA Grade Assessment: IV - A patient with                            severe systemic disease that is a constant threat                            to life. After reviewing the risks and benefits,                            the  patient was deemed in satisfactory condition to                            undergo the procedure.                           After obtaining informed consent, the scope was                            passed under direct vision. Throughout the                            procedure, the patient's blood pressure, pulse, and                            oxygen saturations were monitored continuously. The                            TJF-Q180V ([id number]) Olympus Duodenoscope was                            introduced through the mouth, and used to inject                            contrast into and used to inject contrast into the                            bile duct. The ERCP was accomplished without                            difficulty. The patient tolerated the procedure                            well. Scope In: Scope Out: Findings:      A metal biliary stent was visible on the scout film. The duodenoscope       was advanced to the region of the major papilla without detailed       examination of the UGI tract. The previously placed uncovered metal  stent was in good position extending into the duodenum by about 10mm       across the major papilla. There was obvious signficant tissue ingrowth       visible at the distal aspect of the stent. A 44 autotome over a .035       hydrawire was used to cannulate the stent and the wire was advanced as       deeply as possible. There was clear blockage of the proximal aspect of       the stent as evidenced by wire coiling and also by cholangiogram. The       proximal tip of the stent was just at the biliary bifurcation. The wire       preferentially advanced into the left intrahepatic system for some time.       I used a 9-42mm biliary retrieval balloon to sweep the left intrahepatic       duct as well as the metal stent several times. Copious purulent fluid       and flecks of tissue were delivered into the duodenum. I then used an       angled tip  .035 hydrawire through the same sphincterotome to cannulate       the right biliary system. Cholangiogram showed two rounded accumulations       of contrast, each about 2-3cm across, each seemed to communicate the       with biliary tree. I suspected these to be abscess cavities in the       liver. I used the 9-52mm biliary retrieval balloon to clear the right       biliary duct and the stent, again delivering copious purulent debris,       flecks of tissue into the duodenum. Finally I placed a 9cm long 10Fr       diameter plastic biliary stent through the existing metal stent and into       the right biliary system with the proximal aspect of the stent located       in one of the presumed liver abscesses and the distal end located in the       duodenum. Impression:               - Extensive tissue ingrowth in the previously                            placed metal biliary stent.                           - Near complete blockage of the proixmal aspect of                            the metal bilairy stent, probably from tissue                            ingrowth.                           - Two of the suspected liver abscesses were evident                            on cholangiogram, with apparent direct  communication with the right biliary system.                           - Significant purulence noted throughout the                            examination.                           The above was treated with biliary balloon sweeping                            and then eventual placement of a 9cm long 10Fr                            plastic biliary stent through the existing metal                            stent and into the right biliary system with the                            proximal aspect of the stent located in one of the                            presumed liver abscesses and the distal end located                            in the duodenum. It is not  clear if this will help                            resolve all of the infection in his liver. Moderate Sedation:      Not Applicable - Patient had care per Anesthesia. Recommendation:           - Return patient to hospital ward for ongoing care.                           - Continue broad spective IV antibiotics.                           - Repeat imaging of the liver with CT to check for                            interval improvement in his infectious burden. He                            may need future percutaneous drainage in addition                            to todays precedure. Procedure Code(s):        --- Professional ---                           [phone], Endoscopic retrograde  cholangiopancreatography (ERCP); with placement of                            endoscopic stent into biliary or pancreatic duct,                            including pre- and post-dilation and guide wire                            passage, when performed, including sphincterotomy,                            when performed, each stent Diagnosis Code(s):        --- Professional ---                           K83.1, Obstruction of bile duct CPT copyright 2019 [employer]. All rights reserved. The codes documented in this report are preliminary and upon coder review may  be revised to meet current compliance requirements. Milus Banister, MD 01/01/2020 12:46:03 PM This report has been signed electronically. Number of Addenda: 0

## 2019-12-27 NOTE — Progress Notes (Signed)
   12/09/2019 1740  Assess: MEWS Score  Temp (!) 91.7 F (33.2 C)  Assess: MEWS Score  MEWS Temp 2  MEWS Systolic 0  MEWS Pulse 2  MEWS RR 1  MEWS LOC 2  MEWS Score 7  MEWS Score Color Red  Assess: if the MEWS score is Yellow or Red  Were vital signs taken at a resting state? Yes  Focused Assessment No change from prior assessment  Early Detection of Sepsis Score *See Row Information* Low  MEWS guidelines implemented *See Row Information* Yes  Treat  MEWS Interventions Escalated (See documentation below)  Take Vital Signs  Increase Vital Sign Frequency  Red: Q 1hr X 4 then Q 4hr X 4, if remains red, continue Q 4hrs  Escalate  MEWS: Escalate Red: discuss with charge nurse/RN and provider, consider discussing with RRT  Notify: Charge Nurse/RN  Name of Charge Nurse/RN Notified Marissa, RN  Date Charge Nurse/RN Notified 12/25/2019  Time Charge Nurse/RN Notified 1194  Notify: Provider  Provider Name/Title Verlon Au, MD  Date Provider Notified 12/07/2019  Time Provider Notified 1730  Notification Type Page  Notification Reason Change in status  Response Other (Comment) (MD to bedside)  Date of Provider Response 12/14/2019  Time of Provider Response 1740

## 2019-12-27 NOTE — Progress Notes (Signed)
Pt HR is sustaining <40. SpO2 100%, RR 14, BP 126/89. Unable to obtain Oral or axillary temperature. Samtani,MD notified.

## 2019-12-28 ENCOUNTER — Encounter (HOSPITAL_COMMUNITY): Payer: Self-pay | Admitting: Gastroenterology

## 2019-12-28 DIAGNOSIS — G893 Neoplasm related pain (acute) (chronic): Secondary | ICD-10-CM

## 2019-12-28 DIAGNOSIS — C250 Malignant neoplasm of head of pancreas: Secondary | ICD-10-CM

## 2019-12-28 DIAGNOSIS — D696 Thrombocytopenia, unspecified: Secondary | ICD-10-CM

## 2019-12-28 LAB — COMPREHENSIVE METABOLIC PANEL
ALT: 56 U/L — ABNORMAL HIGH (ref 0–44)
AST: 53 U/L — ABNORMAL HIGH (ref 15–41)
Albumin: 2.2 g/dL — ABNORMAL LOW (ref 3.5–5.0)
Alkaline Phosphatase: 451 U/L — ABNORMAL HIGH (ref 38–126)
Anion gap: 7 (ref 5–15)
BUN: 26 mg/dL — ABNORMAL HIGH (ref 6–20)
CO2: 29 mmol/L (ref 22–32)
Calcium: 7.9 mg/dL — ABNORMAL LOW (ref 8.9–10.3)
Chloride: 103 mmol/L (ref 98–111)
Creatinine, Ser: <0.3 mg/dL — ABNORMAL LOW (ref 0.61–1.24)
Glucose, Bld: 86 mg/dL (ref 70–99)
Potassium: 3.9 mmol/L (ref 3.5–5.1)
Sodium: 139 mmol/L (ref 135–145)
Total Bilirubin: 3 mg/dL — ABNORMAL HIGH (ref 0.3–1.2)
Total Protein: 5.6 g/dL — ABNORMAL LOW (ref 6.5–8.1)

## 2019-12-28 LAB — PREPARE FRESH FROZEN PLASMA
Unit division: 0
Unit division: 0

## 2019-12-28 LAB — BPAM FFP
Blood Product Expiration Date: 202109272359
Blood Product Expiration Date: 202109282359
ISSUE DATE / TIME: 202109230317
ISSUE DATE / TIME: 202109230450
Unit Type and Rh: 5100
Unit Type and Rh: 5100

## 2019-12-28 LAB — LIPASE, BLOOD: Lipase: 24 U/L (ref 11–51)

## 2019-12-28 LAB — CBC
HCT: 31.2 % — ABNORMAL LOW (ref 39.0–52.0)
Hemoglobin: 10.6 g/dL — ABNORMAL LOW (ref 13.0–17.0)
MCH: 27.7 pg (ref 26.0–34.0)
MCHC: 34 g/dL (ref 30.0–36.0)
MCV: 81.7 fL (ref 80.0–100.0)
Platelets: 79 10*3/uL — ABNORMAL LOW (ref 150–400)
RBC: 3.82 MIL/uL — ABNORMAL LOW (ref 4.22–5.81)
RDW: 14.3 % (ref 11.5–15.5)
WBC: 10.2 10*3/uL (ref 4.0–10.5)
nRBC: 0 % (ref 0.0–0.2)

## 2019-12-28 MED ORDER — MORPHINE SULFATE (PF) 2 MG/ML IV SOLN
2.0000 mg | Freq: Once | INTRAVENOUS | Status: AC
  Administered 2019-12-28: 2 mg via INTRAVENOUS
  Filled 2019-12-28: qty 1

## 2019-12-28 MED ORDER — CIPROFLOXACIN IN D5W 400 MG/200ML IV SOLN
400.0000 mg | Freq: Two times a day (BID) | INTRAVENOUS | Status: DC
  Administered 2019-12-28 – 2020-01-04 (×15): 400 mg via INTRAVENOUS
  Filled 2019-12-28 (×15): qty 200

## 2019-12-28 MED ORDER — MORPHINE SULFATE 15 MG PO TABS
15.0000 mg | ORAL_TABLET | ORAL | Status: DC | PRN
  Administered 2020-01-01 – 2020-01-02 (×3): 15 mg via ORAL
  Filled 2019-12-28 (×3): qty 1

## 2019-12-28 MED ORDER — METHOCARBAMOL 500 MG PO TABS
500.0000 mg | ORAL_TABLET | Freq: Once | ORAL | Status: AC
  Administered 2019-12-28: 500 mg via ORAL
  Filled 2019-12-28: qty 1

## 2019-12-28 MED ORDER — MORPHINE SULFATE (PF) 2 MG/ML IV SOLN: 2.0000 mg | INTRAVENOUS | Status: DC | PRN

## 2019-12-28 MED ORDER — METRONIDAZOLE IN NACL 5-0.79 MG/ML-% IV SOLN
500.0000 mg | Freq: Three times a day (TID) | INTRAVENOUS | Status: DC
  Administered 2019-12-28 – 2020-01-04 (×23): 500 mg via INTRAVENOUS
  Filled 2019-12-28 (×23): qty 100

## 2019-12-28 MED ORDER — MORPHINE SULFATE (PF) 2 MG/ML IV SOLN
2.0000 mg | INTRAVENOUS | Status: DC | PRN
  Administered 2019-12-28: 4 mg via INTRAVENOUS
  Administered 2019-12-28: 2 mg via INTRAVENOUS
  Administered 2019-12-28: 4 mg via INTRAVENOUS
  Administered 2019-12-29 – 2020-01-04 (×4): 2 mg via INTRAVENOUS
  Administered 2020-01-04: 4 mg via INTRAVENOUS
  Filled 2019-12-28: qty 2
  Filled 2019-12-28: qty 1
  Filled 2019-12-28: qty 2
  Filled 2019-12-28 (×3): qty 1
  Filled 2019-12-28 (×3): qty 2

## 2019-12-28 NOTE — Progress Notes (Addendum)
HEMATOLOGY-ONCOLOGY PROGRESS NOTE  SUBJECTIVE: Status post ERCP and ultrasound-guided thoracentesis 9/23.  Tolerated procedures well.  States that his breathing is not really any better.  He is having increased back pain today.  Oncology History  Malignant neoplasm of head of pancreas [facility])   Initial Diagnosis   Pancreatic adenocarcinoma ([place])   12/15/2018 Imaging   CT abdomen/pelvis w/ contrast: IMPRESSION: 1. Suggestion of a 1.5 cm mass within the mid aspect of the pancreatic body with associated atrophy of the upstream pancreatic tail and associated pancreatic ductal dilatation. Recommend further evaluation with MRI. There is soft tissue fullness around the superior mesenteric artery and celiac axis. This may represent edema or potentially tumor involvement. 2. Tree-in-bud nodularity within the peripheral aspect of the lower lobes bilaterally which may be secondary to an infectious or inflammatory process. Recommend follow-up chest CT in 3 months to assess for interval change. 3. These results will be called to the ordering clinician or representative by the Radiologist Assistant, and communication documented in the PACS or zVision Dashboard.   12/29/2018 Procedure   EUS: -2.4 x 1.9cm mass in the body of the pancreas causing the main pancreatic duct obstruction and clearly involving the celiac trunk. Biopsied. -Heterogenous soft tissue mass in the neck, measuring 3.6cm maximally. This was not sampled, presumed to be a large thyroid gland.  -No peripancreatic adenopathy -CBD was normal, non-dilated.   12/29/2018 Pathology Results   CASE: [id number]   DIAGNOSIS:  - Malignant cells consistent with adenocarcinoma  - See comment.   01/17/2019 Imaging   PET: IMPRESSION: 1. Suspected enlargement of the pancreatic mass, with abnormal hypermetabolic activity at the junction of the pancreatic body and tail currently measuring 3.4 by 2.9 by 2.4 cm. Although anatomic localization  is hampered by the patient's extreme paucity of adipose tissue (making separation of adjacent structures difficult on noncontrast CT), there is thought to be a high likelihood tumor extending around the vicinity of the celiac trunk and possibly the superior mesenteric artery. In this case, MRI might provide better anatomic localization with respect to perivascular involvement. 2. Potential mild left periaortic adenopathy, maximum SUV of the indistinct left periaortic lymph nodes is approximately 3.3 which is above blood pool levels. 3. Scattered ground-glass density nodules in the lungs favoring the upper lobes, with tree-in-bud nodularity in the lung bases favoring the lower lobes. None of these lesions are hypermetabolic. While possibilities include atypical infectious process drug reaction, surveillance of the chest is recommended to exclude low-grade adenocarcinoma. 4.  Aortic Atherosclerosis (ICD10-I70.0).   01/24/2019 -  Chemotherapy   The patient had PACLitaxel-protein bound (ABRAXANE) chemo infusion 150 mg, 100 mg/m2 = 150 mg (100 % of original dose 100 mg/m2), Intravenous,  Once, 6 of 8 cycles Dose modification: 100 mg/m2 (original dose 100 mg/m2, Cycle 1, Reason: Provider Judgment) Administration: 150 mg (01/24/2019), 150 mg (02/14/2019), 150 mg (03/02/2019), 150 mg (03/16/2019), 150 mg (04/26/2019), 150 mg (07/04/2019), 150 mg (07/18/2019), 150 mg (05/09/2019), 150 mg (05/23/2019), 150 mg (08/08/2019), 150 mg (08/22/2019), 150 mg (06/22/2019) gemcitabine (GEMZAR) 1,102 mg in sodium chloride 0.9 % 250 mL chemo infusion, 800 mg/m2 = 1,102 mg (100 % of original dose 800 mg/m2), Intravenous,  Once, 6 of 8 cycles Dose modification: 800 mg/m2 (original dose 800 mg/m2, Cycle 1, Reason: Provider Judgment), 1,000 mg/m2 (original dose 800 mg/m2, Cycle 5, Reason: Provider Judgment) Administration: 1,102 mg (01/24/2019), 1,102 mg (02/14/2019), 1,102 mg (03/02/2019), 1,102 mg (03/16/2019), 1,102 mg  (04/26/2019), 1,406 mg (07/04/2019), 1,406 mg (07/18/2019), 1,406 mg (  05/09/2019), 1,406 mg (05/23/2019), 1,406 mg (08/08/2019), 1,406 mg (08/22/2019), 1,406 mg (06/22/2019)  for chemotherapy treatment.    02/28/2019 Cancer Staging   Staging form: Exocrine Pancreas, AJCC 8th Edition - Clinical: Stage IB (cT2, cN0, cM0) - Signed by Tish Men, MD on 02/28/2019   04/16/2019 Imaging   CT CAP: IMPRESSION: 1. Margins of pancreatic neoplasm are difficult to identify. Similar appearance of soft tissue infiltration within the retroperitoneum with progressive narrowing of the portal venous confluence by tumor. No specific findings of solid organ metastasis or nodal metastasis within the abdomen or pelvis. 2. Unchanged appearance of multifocal sub solid nodules within the upper lung zones and bilateral lower lung zone tree-in-bud nodularity. As mentioned previously findings are nonspecific and may be inflammatory or infectious in etiology. Metastatic disease is not excluded. 3. Aortic atherosclerosis. 4. Right lobe of thyroid gland nodule. Consider further evaluation with thyroid ultrasound. If patient is clinically hyperthyroid, consider nuclear medicine thyroid uptake and scan.    PHYSICAL EXAMINATION:  Vitals:   12/28/19 0919 12/28/19 1040  BP: (!) 137/102 (!) 128/96  Pulse: 87 74  Resp: (!) 26 20  Temp: (!) 97.5 F (36.4 C)   SpO2: 98% 100%   Filed Weights   12/24/19 1440  Weight: 39.6 kg    Intake/Output from previous day: 09/23 0701 - 09/24 0700 In: 1627 [P.O.:50; I.V.:960; Blood:367; IV Piggyback:250] Out: 2725 [Urine:1050; Stool:1; Blood:5]  GENERAL: Alert, cachectic, no distress; minimal verbal interaction HEENT: Mild scleral icterus.  No thrush. SKIN: Small scattered ecchymoses. LUNGS: Right breath sounds diminished at the base.  No respiratory distress. HEART: regular rate & rhythm  ABDOMEN: Mild generalized tenderness. Vascular: Pitting edema at the lower legs  bilaterally. NEURO: Follows commands  LABORATORY DATA:  I have reviewed the data as listed CMP Latest Ref Rng & Units 12/28/2019 12/15/2019 12/26/2019  Glucose 70 - 99 mg/dL 86 93 173(H)  BUN 6 - 20 mg/dL 26(H) 21(H) 28(H)  Creatinine 0.61 - 1.24 mg/dL <0.30(L) <0.30(L) <0.30(L)  Sodium 135 - 145 mmol/L 139 141 139  Potassium 3.5 - 5.1 mmol/L 3.9 3.5 3.6  Chloride 98 - 111 mmol/L 103 105 102  CO2 22 - 32 mmol/L 29 30 28   Calcium 8.9 - 10.3 mg/dL 7.9(L) 7.6(L) 7.8(L)  Total Protein 6.5 - 8.1 g/dL 5.6(L) 5.3(L) 6.1(L)  Total Bilirubin 0.3 - 1.2 mg/dL 3.0(H) 5.7(H) 5.6(H)  Alkaline Phos 38 - 126 U/L 451(H) 610(H) 634(H)  AST 15 - 41 U/L 53(H) 68(H) 69(H)  ALT 0 - 44 U/L 56(H) 58(H) 62(H)    Lab Results  Component Value Date   WBC 10.2 12/28/2019   HGB 10.6 (L) 12/28/2019   HCT 31.2 (L) 12/28/2019   MCV 81.7 12/28/2019   PLT 79 (L) 12/28/2019   NEUTROABS 7.4 01/02/2020    CT Head Wo Contrast  Result Date: 12/08/2019 CLINICAL DATA:  Pancreatic cancer, chemotherapy, weakness, headache EXAM: CT HEAD WITHOUT CONTRAST TECHNIQUE: Contiguous axial images were obtained from the base of the skull through the vertex without intravenous contrast. COMPARISON:  None. FINDINGS: Brain: No evidence of acute infarction, hemorrhage, hydrocephalus, extra-axial collection or mass lesion/mass effect. Vascular: No hyperdense vessel or unexpected calcification. Skull: Normal. Negative for fracture or focal lesion. Sinuses/Orbits: No acute finding. Other: None. IMPRESSION: No acute intracranial pathology. No non-contrast CT evidence of intracranial metastatic disease or findings to explain headache. Contrast enhanced MRI may be used to more sensitively evaluate for intracranial metastatic disease if desired. Electronically Signed   By: Cristie Hem  Laqueta Carina M.D.   On: 12/25/2019 11:30   CT Angio Chest PE W and/or Wo Contrast  Result Date: 12/20/2019 CLINICAL DATA:  Shortness of breath and chest pain. Suspect pulmonary  embolism. History of pancreatic cancer on chemotherapy. EXAM: CT ANGIOGRAPHY CHEST WITH CONTRAST TECHNIQUE: Multidetector CT imaging of the chest was performed using the standard protocol during bolus administration of intravenous contrast. Multiplanar CT image reconstructions and MIPs were obtained to evaluate the vascular anatomy. CONTRAST:  130mL OMNIPAQUE IOHEXOL 350 MG/ML SOLN COMPARISON:  08/29/2019 FINDINGS: Cardiovascular: Right IJ central venous catheter is present with tip over the cavoatrial junction. Heart is normal size. Thoracic aorta is normal caliber. Pulmonary arterial system is well opacified and demonstrates a single small embolus over a right lower lobar subsegmental pulmonary artery. No left-sided pulmonary emboli. Mediastinum/Nodes: No evidence of mediastinal or hilar adenopathy. Remaining mediastinal structures are unremarkable. Lungs/Pleura: Lungs are adequately inflated demonstrate a moderate size right pleural effusion with associated atelectasis in the right base. There are numerous small bilateral pulmonary nodules with interval progression likely worsening metastatic disease. Airways are unremarkable. Upper Abdomen: Not well evaluated on this arterial phase CT scan. Suggestion of partial visualization of patient's known biliary stent. Moderate pneumobilia is present. Couple low-density foci over the right lobe of the liver with air-fluid levels of uncertain clinical significance. Musculoskeletal: No focal abnormality. Review of the MIP images confirms the above findings. IMPRESSION: 1. Single small embolus over a right lower lobar subsegmental pulmonary artery. 2. Moderate size right pleural effusion with associated right basilar atelectasis. 3. Interval progression of multiple pulmonary nodules likely metastatic disease due to patient's known pancreatic cancer. 4. Couple indeterminate low-density foci over the right lobe of the liver with air-fluid levels of not well evaluated on this  arterial phase contrast CT. Pneumobilia and partially visualized biliary stent. Recommend clinical correlation as further evaluation with venous phase contrast enhanced CT of the abdomen/pelvis would be helpful. Critical Value/emergent results were called by telephone at the time of interpretation on 12/05/2019 at 3:08 pm to provider [facility] RAY , who verbally acknowledged these results. Electronically Signed   By: Marin Olp M.D.   On: 12/22/2019 15:10   CT ABDOMEN PELVIS W CONTRAST  Result Date: 12/25/2019 CLINICAL DATA:  39 year old male with history of abdominal pain. Suspected abscess. EXAM: CT ABDOMEN AND PELVIS WITH CONTRAST TECHNIQUE: Multidetector CT imaging of the abdomen and pelvis was performed using the standard protocol following bolus administration of intravenous contrast. CONTRAST:  43mL OMNIPAQUE IOHEXOL 300 MG/ML  SOLN COMPARISON:  CT the abdomen and pelvis 08/29/2019. FINDINGS: Lower chest: Moderate right and small left pleural effusions lying dependently. Some pleural enhancement and thickening in the lower right hemithorax suggestive of malignant pleural involvement. Numerous pulmonary nodules scattered throughout the visualize lung bases, including areas of confluent nodularity in the lower lobes of the lungs bilaterally, most compatible with progressive metastatic disease. Hepatobiliary: Compared to the prior study there are numerous low-attenuation lesions within the liver, highly suspicious for multifocal intrahepatic abscesses (less likely to represent centrally necrotic metastatic lesions). The largest of these has internal septations and measures up to 3.6 x 3.5 cm in segment 5 (axial image 30 of series 2). One of these lesions in segment 7/8 of the liver has some non dependent gas, strongly favoring abscess. Extensive intrahepatic biliary ductal dilatation with a large volume of pneumobilia. Gallbladder is unremarkable in appearance. Interval placement of common bile duct stent  which appears appropriately located. Pancreas: Poorly defined pancreatic mass in the superior  aspect of the head of the pancreas (soft tissues in the superior aspect of the head of the pancreas are somewhat indistinct, potentially treatment related). Haziness in the peripancreatic fat, including soft tissue around portions of the celiac axis, common hepatic artery and proximal superior mesenteric artery, as well as the left renal vein, all of which appear narrowed, likely indicative of locally infiltrative tumor. This lesion is difficult to definitively identify and measure, but is estimated to measure approximately 4.4 x 3.0 cm (axial image 27 of series 2). Spleen: The appearance of the spleen is unremarkable. Adrenals/Urinary Tract: Bilateral kidneys and adrenal glands are normal in appearance. No hydroureteronephrosis. Urinary bladder is unremarkable in appearance. Stomach/Bowel: The appearance of the stomach is unremarkable. No pathologic dilatation of small bowel or colon. Portions of the colon appear thickened with some mucosal hyperenhancement (poorly evaluated given the adjacent ascites), best appreciated in the region of the cecum, ascending colon and hepatic flexure, suggestive of underlying colitis. Appendix is not confidently identified. Vascular/Lymphatic: Aortic atherosclerosis, without evidence of aneurysm or dissection in the abdominal or pelvic vasculature. Vascular involvement from the pancreatic mass, discussed above. Poorly defined infiltrative soft tissue throughout the retroperitoneum adjacent to the pancreatic mass, with soft tissue completely encircling the upper abdominal aorta immediately below the level of the renal arteries (axial image 30 of series 2), presumably indicative of lymphatic spread of disease. Reproductive: Prostate gland and seminal vesicles are unremarkable in appearance. Other: Large volume of ascites. No pneumoperitoneum. Diffuse body wall edema. Musculoskeletal: There  are no aggressive appearing lytic or blastic lesions noted in the visualized portions of the skeleton. IMPRESSION: 1. Today's study demonstrates what appears to be progression of disease with innumerable metastatic lesions throughout the visualize lung bases, probable malignant moderate right pleural effusion and small left pleural effusion, enlargement of the primary pancreatic mass now with vascular involvement, and what appears to be evolving retroperitoneal lymphadenopathy encasing the abdominal aorta. 2. In addition, there has been interval placement of a common bile duct stent, now with substantial intrahepatic biliary ductal dilatation, large amount of pneumatosis, and multiple new low-attenuation liver lesions, some of which have internal gas fluid levels, highly concerning for multiple intra-abdominal abscesses and probable cholangitis. 3. Profound mural thickening and mucosal hyperenhancement in portions of the colon (predominantly cecum, ascending colon and hepatic flexure), concerning for colitis. 4. Aortic atherosclerosis. 5. Additional incidental findings, as above. Electronically Signed   By: Vinnie Langton M.D.   On: 12/25/2019 14:50   DG Chest Port 1 View  Result Date: 12/20/2019 CLINICAL DATA:  39 year old male, status post thoracentesis EXAM: PORTABLE CHEST 1 VIEW COMPARISON:  Chest CT 12/06/2019, chest x-ray 12/23/2018 FINDINGS: Cardiomediastinal silhouette unchanged in size and contour. Right IJ port catheter unchanged. Micro nodular pattern of opacity throughout the lungs, compatible with findings on the prior CT. Blunting of the right costophrenic angle persists, with no pneumothorax. No left-sided pleural fluid.  No new confluent airspace disease. IMPRESSION: No complicating features status post right-sided thoracentesis, with persistent small fluid/atelectasis at the right lung base. Micro nodular pattern of opacity of the bilateral lungs compatible with findings on the recent prior CT  chest. Unchanged right IJ port catheter. Electronically Signed   By: Corrie Mckusick D.O.   On: 12/09/2019 16:04   DG Chest Port 1 View  Result Date: 12/26/2019 CLINICAL DATA:  Pancreatic cancer, weakness EXAM: PORTABLE CHEST 1 VIEW COMPARISON:  08/29/2019 chest CT. FINDINGS: Right internal jugular Port-A-Cath terminates at the cavoatrial junction. Normal heart size. Normal  mediastinal contour. No pneumothorax. New small right pleural effusion. No left pleural effusion. No pulmonary edema. Hazy patchy right lung base opacity. Indistinct tiny nodular opacities scattered in the lower lungs bilaterally. IMPRESSION: 1. New small right pleural effusion. 2. Hazy patchy right lung base opacity, favor atelectasis. 3. Indistinct tiny nodular opacities scattered in the lower lungs bilaterally, as seen on prior chest CT, cannot exclude metastatic disease. Electronically Signed   By: Ilona Sorrel M.D.   On: 12/31/2019 11:40   DG ERCP  Result Date: 01/02/2020 CLINICAL DATA:  Pancreas cancer, cholangitis, sepsis, stent occlusion EXAM: ERCP with common bile duct stent revision TECHNIQUE: Multiple spot images obtained with the fluoroscopic device and submitted for interpretation post-procedure. FLUOROSCOPY TIME:  Fluoroscopy Time:  10 minutes 37 seconds Radiation Exposure Index (if provided by the fluoroscopic device): Dense 110 mGy Number of Acquired Spot Images: Numerous spot fluoroscopic view COMPARISON:  12/25/2019 FINDINGS: Spot fluoroscopic views during ERCP confirm wire and catheter traversing the biliary metallic stent. Proximal contrast injection above the stent confirms biliary obstruction from stent occlusion. Endoscopic plastic biliary stent now traverses the previous metallic stent. 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 Successful plastic endoscopic stent placement traversing the proximal biliary tree through the CBD metallic stent. These images were submitted for  radiologic interpretation only. Please see the procedural report for the amount of contrast and the fluoroscopy time utilized. Electronically Signed   By: Jerilynn Mages.  Shick M.D.   On: 12/14/2019 13:05   US THORACENTESIS ASP PLEURAL SPACE W/IMG GUIDE  Result Date: 12/15/2019 INDICATION: Patient with history of pancreatic cancer, failure to thrive, and right pleural effusion. Request made for diagnostic and therapeutic right thoracentesis EXAM: ULTRASOUND GUIDED DIAGNOSTIC AND THERAPEUTIC RIGHT THORACENTESIS MEDICATIONS: 8 mL 1% lidocaine COMPLICATIONS: None immediate. PROCEDURE: An ultrasound guided thoracentesis was thoroughly discussed with the patient and questions answered. The benefits, risks, alternatives and complications were also discussed. The patient understands and wishes to proceed with the procedure. Written consent was obtained. Ultrasound was performed to localize and mark an adequate pocket of fluid in the right chest. The area was then prepped and draped in the normal sterile fashion. 1% Lidocaine was used for local anesthesia. Under ultrasound guidance a 6 Fr Safe-T-Centesis catheter was introduced. Thoracentesis was performed. The catheter was removed and a dressing applied. FINDINGS: A total of approximately 850 mL of dark red fluid was removed. Samples were sent to the laboratory as requested by the clinical team. IMPRESSION: Successful ultrasound guided right thoracentesis yielding 850 mL of pleural fluid. Read by: Earley Abide, PA-C Electronically Signed   By: Corrie Mckusick D.O.   On: 01/03/2020 16:00    ASSESSMENT AND PLAN: 1. Pancreas cancer  11/29/2018 CT abdomen/pelvis without contrast-appendix not well visualized, limited evaluation due to lack of enteric contrast and perineal fat  12/15/2018 CT abdomen/pelvis with contrast-possible 1.5 cm mass within the mid aspect of the pancreatic body with associated atrophy of the upstream pancreatic tail and associated pancreatic ductal  dilatation. Soft tissue fullness around the superior mesenteric artery and celiac axis. Tree-in-bud nodularity within the peripheral aspect of the lower lobes bilaterally.  12/28/2018 upper EUS-2.4 cm x 1.9 cm mass in the body of the pancreas causing main pancreatic duct obstruction and clearly involving the celiac trunk. Heterogeneous soft tissue mass in the neck measuring 3.6 cm. Fine-needle aspiration pancreas with malignant cells consistent with adenocarcinoma.  01/16/2019 Port-A-Cath placement  01/17/2019 PET scan-suspected enlargement of the pancreatic mass with abnormal hypermetabolic activity  at the junction of the pancreatic body and tail measuring 3.4 x 2.9 x 2.4 cm, thought to be a high likelihood tumor extension around the vicinity of the celiac trunk and possibly the superior mesenteric artery. Potential mild left periaortic adenopathy maximum SUV of the indistinct left periaortic lymph nodes approximately 3.3 which is above blood pool levels. Scattered groundglass density nodules in the lungs favoring the upper lobes with tree-in-bud nodularity in the lung bases favoring the lower lobes. None of the lesions are hypermetabolic.  Gemcitabine/Abraxane 01/24/2019-08/22/2019  04/16/2019 CT chest/abdomen/pelvis-margins of pancreatic neoplasm difficult to identify. Similar appearance of soft tissue infiltration within the retroperitoneum with progressive narrowing of the portal venous confluence by tumor. No specific findings of solid organ metastasis or nodal metastasis within the abdomen or pelvis. Unchanged appearance of multifocal subsolid nodules within the upper lung zones and bilateral lower lung zone tree-in-bud nodularity. Right lobe of thyroid gland nodule.  This  CT 08/29/2019-development of intra and extrahepatic biliary duct dilatation with abrupt cut off at the common bile duct, abnormal soft tissue in the retroperitoneum attenuates the portal vein and the splenic vein is  occluded, fullness in the head of the pancreas appears more prominent without discrete margins, subtle 12 mm focus in the medial left liver   MRI liver 09/20/2019-heterogeneous hepatic hyperenhancement favored to represent perfusion anomalies in the setting of portal vein narrowing.  No convincing evidence of hepatic metastasis.  Locally advanced pancreatic carcinoma suboptimally evaluated.  Persistent mild intrahepatic biliary duct dilatation.  Subtle peripancreatic edema.  CT chest 12/25/2019-single small embolus over a right lower lobar subsegmental pulmonary artery.  Moderate sized right pleural effusion with associated right basilar atelectasis.  Interval progression of multiple pulmonary nodules likely metastatic disease.  Indeterminate low-density foci over the right lobe of the liver with air-fluid levels.  Pneumobilia and partially visualized biliary stent.  CT abdomen/pelvis 12/25/2019-moderate right and small left pleural effusions.  Some pleural enhancement and thickening in the lower right hemithorax suggestive of malignant pleural involvement.  Numerous pulmonary nodules scattered throughout the visualized lung bases most compatible with progressive metastatic disease. Interval placement of a common bile duct stent, now with substantial intrahepatic biliary ductal dilatation, large amount of pneumatosis and multiple new low-attenuation liver lesions some of which have internal gas fluid levels highly concerning for multiple intra-abdominal abscesses and probable cholangitis.  Profound mural thickening and mucosal hyperenhancement in portions of the colon concerning for colitis. 2. Abdominal/back pain secondary to #1 3. Port-A-Cath placement 01/16/2019 4. PE on chest CT 12/21/2019.  Anticoagulation on hold due to severe thrombocytopenia. 5. Right pleural effusion on chest CT 12/16/2019-plan for thoracentesis when platelet count improves, fluid for cytology.  Mr. Perusse appears unchanged.   Platelets continue to improve.  T bili trending downward.  ERCP showed significant tissue ingrowth into the stent.  A new plastic biliary stent was placed.  He was found to have intrahepatic abscess and remains on IV antibiotics.  GI planning for repeat CT of the abdomen pelvis on 9/26.  He is status post thoracentesis with really no change in his breathing.  Will await cytology.  Recommendations: 1.  ERCP and imaging findings concerning for metastatic cancer.  Appreciate assistance from palliative care team with goals of care discussion.  Will await cytology.  The patient may be to deconditioned to receive any additional chemotherapy and may benefit from hospice if cytology shows metastatic disease. 2. Hold anticoagulation therapy and filter placement for now, can start Lovenox anticoagulation if platelets are higher  again tomorrow 3.  Continue narcotic analgesics for pain  Please call medical oncology over the weekend for questions.  We will follow up on Monday if he remains in the hospital.   LOS: 5 days   Mikey Bussing, AGPCNP-BC 12/28/19  Mr. Monestime was interviewed and examined.  He continues to have pain.  I added MSIR back to the pain regimen.  He underwent placement of a biliary stent and thoracentesis yesterday.  The platelet count is recovering.  We will follow up on the pleural fluid cytology.  If positive for metastatic pancreas cancer I will recommend hospice care.  I will check on him 12/31/2019 if he remains in the hospital.  Outpatient follow-up will be scheduled at the Cancer center.

## 2019-12-28 NOTE — Progress Notes (Signed)
PROGRESS NOTE    Gabriel [name]  OAC:[id number] DOB: 1981-01-04 DOA: 01/03/2020 PCP: Kerin Perna, NP  Brief Narrative:  39 year old black male Diagnosed with stage Ib T2N0MX pancreatic cancer 910 2021.5 cm mass-EUS 9/24 2.5 cm mass biopsy = adenocarcinoma-not a candidate for surgery due to SMA involvement-underwent palliative gemcitabine and Abraxane- Previous ERCP with Dr. Ardis Hughs tight mid biliary structure 6 cm / 10 cm uncovered biliary stent was placed in June 2021 he has been scheduled for radiation therapy presented with decreased appetite generalized weakness and was altered and confused on admission 01/01/2020 Notable labs BUNs/creatinine 51/0.5 AST/ALT 62/61 alk phos 471 WBC 38 Platelets were 11,000 on admission CT chest small subsegmental PE moderate right-sided effusion CT abdomen pelvis multiple no attenuating lesions?  Hepatic abscesses 3.6 X3.5 Sepsis from biliary versus urinary source entertained GI consulted ERCP performed 9/23 as below  Assessment & Plan:   Principal Problem:   Sepsis secondary to UTI [facility]) Active Problems:   Malignant neoplasm of head of pancreas ([place])   Cancer-related pain   Failure to thrive in adult   Pulmonary embolism (HCC)   Pleural effusion   Pressure injury of skin   1. Sepsis on admission with hepatic abscesses +cholangitis?  Growing Klebsiella in urine-blood cultures not done on admission a. Transition 9/24 from meropenem to Cipro and Flagyl-stop date 01/06/2019 2114 days at least b. Imaging as per GI a.m.-review and see if abscesses have gotten smaller c. Extensive ERCP and instrumentation with plastic stent placement 9/23 Dr. Ardis Hughs shows significant purulence 2. Probable metastatic pancreatic cancer on palliative gemcitabine Abraxane SBRT a. Further planning re: chemoradiation to oncology b. CT chest showed pulmonary mets small pleural effusion and retroperitoneal lymphadenopathy c. Family does not seem prepared for goals of  care discussions at this time d. Cytology of pleural fluid was performed on thoracentesis 9/24 and is pending-if patient stabilizes may need to discuss this in the outpatient setting with him and family 3. Small subsegmental pulmonary embolism a. Not really a candidate for anticoagulation at this time b. Oncology recommends holding off on IVC filter placement no anticoagulation currently c. Monitor and discuss as an outpatient 4. Moderate right pleural effusion a. See above discussion-thoracentesis performed 9/23 b. We will repeat chest x-ray in the am  5. Severe thrombocytopenia a. Platelet counts coming up and improving probably secondary to resolution of severe sepsis with probable DIC b. See above  DVT prophylaxis: SCD Code Status: Full code at this time Family Communication: Long discussion with mother as she was coming in-have mentioned that patient is stabilizing but still not quite stable for discharge and of encouraged her to talk with patient's wife/fiance with regards to home management He is still requiring IV pain meds, still requiring total care and we will need to make sure we have a safe form of the next several days in terms of pain management Disposition:   Status is: Inpatient  Remains inpatient appropriate because:Persistent severe electrolyte disturbances, Altered mental status, Ongoing diagnostic testing needed not appropriate for outpatient work up and IV treatments appropriate due to intensity of illness or inability to take PO   Dispo: The patient is from: Home              Anticipated d/c is to: Unclear unclear              Anticipated d/c date is: 2 days              Patient currently is not medically stable  to d/c.  Disposition is unclear-patient has advanced disease and it is unclear if he has further options he will need further discussion with his oncologist regarding further planning     Consultants:   Dr. Benay Spice oncology  Dr. Ardis Hughs  gastroenterology  Dr. Domingo Cocking palliative  Procedures: ERCP 9/23 Impression:               - Extensive tissue ingrowth in the previously                            placed metal biliary stent.                           - Near complete blockage of the proixmal aspect of                            the metal bilairy stent, probably from tissue                            ingrowth.                           - Two of the suspected liver abscesses were evident                            on cholangiogram, with apparent direct                            communication with the right biliary system.                           - Significant purulence noted throughout the                            examination.                           The above was treated with biliary balloon sweeping                            and then eventual placement of a 9cm long 10Fr                            plastic biliary stent through the existing metal                            stent and into the right biliary system with the                            proximal aspect of the stent located in one of the                            presumed liver abscesses and the distal end located  in the duodenum. It is not clear if this will help                            resolve all of the infection in his liver.  Antimicrobials: Meropenem  Subjective:  Much more awake alert-not hypothermic anymore No chest pain but does have abdominal pain lower extremity pain Tells me he cannot really eat as he has pain when he does and can only take liquids He is also been having copious amounts of diarrhea which is unformed and greenish in consistency  Objective: Vitals:   12/28/19 0610 12/28/19 0826 12/28/19 0919 12/28/19 1040  BP: (!) 128/92 (!) 139/93 (!) 137/102 (!) 128/96  Pulse: 67 65 87 74  Resp: 16 20 (!) 26 20  Temp: (!) 97.5 F (36.4 C) 97.8 F (36.6 C) (!) 97.5 F (36.4 C)   TempSrc:  Oral Oral    SpO2: 100% 96% 98% 100%  Weight:      Height:        Intake/Output Summary (Last 24 hours) at 12/28/2019 1237 Last data filed at 12/28/2019 4709 Gross per 24 hour  Intake 1010 ml  Output 1051 ml  Net -41 ml   Filed Weights   12/24/19 1440  Weight: 39.6 kg    Examination:  Awake coherent no distress Multiple tattoos over neck and back Very cachectic Abdomen slight distention no rebound No lower extremity edema Stage I decubitus ulcer over sacrum with greenish dark stool Chest clear no added sound Port right side of chest  Data Reviewed: I have personally reviewed following labs and imaging studies  Sodium 139 BUN/creatinine 26/0.3 Alk phos 451 down from 69 AST/ALT 68/58-->53/56 Bilirubin 5.7-->3.0 White count 10.2 hemoglobin 10.6 Platelet 14-->43-->79  Radiology Studies: DG Chest Port 1 View  Result Date: 01/01/2020 CLINICAL DATA:  39 year old male, status post thoracentesis EXAM: PORTABLE CHEST 1 VIEW COMPARISON:  Chest CT 12/05/2019, chest x-ray 12/23/2018 FINDINGS: Cardiomediastinal silhouette unchanged in size and contour. Right IJ port catheter unchanged. Micro nodular pattern of opacity throughout the lungs, compatible with findings on the prior CT. Blunting of the right costophrenic angle persists, with no pneumothorax. No left-sided pleural fluid.  No new confluent airspace disease. IMPRESSION: No complicating features status post right-sided thoracentesis, with persistent small fluid/atelectasis at the right lung base. Micro nodular pattern of opacity of the bilateral lungs compatible with findings on the recent prior CT chest. Unchanged right IJ port catheter. Electronically Signed   By: Corrie Mckusick D.O.   On: 12/20/2019 16:04   DG ERCP  Result Date: 12/16/2019 CLINICAL DATA:  Pancreas cancer, cholangitis, sepsis, stent occlusion EXAM: ERCP with common bile duct stent revision TECHNIQUE: Multiple spot images obtained with the fluoroscopic device and submitted  for interpretation post-procedure. FLUOROSCOPY TIME:  Fluoroscopy Time:  10 minutes 37 seconds Radiation Exposure Index (if provided by the fluoroscopic device): Dense 110 mGy Number of Acquired Spot Images: Numerous spot fluoroscopic view COMPARISON:  12/25/2019 FINDINGS: Spot fluoroscopic views during ERCP confirm wire and catheter traversing the biliary metallic stent. Proximal contrast injection above the stent confirms biliary obstruction from stent occlusion. Endoscopic plastic biliary stent now traverses the previous metallic stent. 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 Successful plastic endoscopic stent placement traversing the proximal biliary tree through the CBD metallic stent. These images were submitted for radiologic interpretation only. Please see the procedural report for the amount of contrast and the fluoroscopy time utilized.  Electronically Signed   By: Jerilynn Mages.  Shick M.D.   On: 12/05/2019 13:05   US THORACENTESIS ASP PLEURAL SPACE W/IMG GUIDE  Result Date: 12/13/2019 INDICATION: Patient with history of pancreatic cancer, failure to thrive, and right pleural effusion. Request made for diagnostic and therapeutic right thoracentesis EXAM: ULTRASOUND GUIDED DIAGNOSTIC AND THERAPEUTIC RIGHT THORACENTESIS MEDICATIONS: 8 mL 1% lidocaine COMPLICATIONS: None immediate. PROCEDURE: An ultrasound guided thoracentesis was thoroughly discussed with the patient and questions answered. The benefits, risks, alternatives and complications were also discussed. The patient understands and wishes to proceed with the procedure. Written consent was obtained. Ultrasound was performed to localize and mark an adequate pocket of fluid in the right chest. The area was then prepped and draped in the normal sterile fashion. 1% Lidocaine was used for local anesthesia. Under ultrasound guidance a 6 Fr Safe-T-Centesis catheter was introduced. Thoracentesis was performed. The catheter was  removed and a dressing applied. FINDINGS: A total of approximately 850 mL of dark red fluid was removed. Samples were sent to the laboratory as requested by the clinical team. IMPRESSION: Successful ultrasound guided right thoracentesis yielding 850 mL of pleural fluid. Read by: Earley Abide, PA-C Electronically Signed   By: Corrie Mckusick D.O.   On: 12/21/2019 16:00     Scheduled Meds: . (feeding supplement) PROSource Plus  30 mL Oral TID BM  . sodium chloride   Intravenous Once  . Chlorhexidine Gluconate Cloth  6 each Topical Daily  . feeding supplement  1 Container Oral TID BM  . indomethacin  100 mg Rectal Once  . morphine  60 mg Oral Q12H  . multivitamin with minerals  1 tablet Oral Daily  . nystatin  5 mL Oral QID  . sodium chloride flush  3 mL Intravenous Q12H   Continuous Infusions: . ciprofloxacin    . dextrose 5 % and 0.9% NaCl 60 mL/hr at 12/28/2019 1823  . metronidazole 500 mg (12/28/19 1117)     LOS: 5 days    Time spent: [place], MD [facility] To contact the attending provider between 7A-7P or the covering provider during after hours 7P-7A, please log into the web site [email or web address] and access using universal [place] password for that web site. If you do not have the password, please call the hospital operator.  12/28/2019, 12:37 PM

## 2019-12-28 NOTE — Progress Notes (Addendum)
Patient ID: Gabriel [name], male   DOB: November 28, 1980, 39 y.o.   MRN: [id number]    Progress Note   Subjective   day # 4  CC; progressive metastatic pancreatic adenocarcinoma, cholangitis, hepatic abscesses  ERCP yesterday-prior stent in good position, obvious significant tissue ingrowth in the distal aspect of the stent, and blockage of the proximal aspect of the stent Balloon sweeping with copious purulent material and flecks of tissue.  On cholangiogram there were 2 rounded accumulations of contrast about 2 to 3 cm across each which seem to communicate with the biliary tree suspected to be abscess cavities in the liver.  A 9 cm 10 [place] plastic biliary stent was placed through the existing metal stent and into the right biliary system with the proximal aspect of the stent in one of the presumed liver abscesses, and distal end in the duodenum  IV meropenem  Status post thoracentesis yesterday with 850 cc removed   WBC down to 10.2/hemoglobin 10.6/hematocrit 31.2/platelets 79 Lipase 24 T bili 3.0/alk phos 451/AST 53/ALT 56-improved  Hypothermic last p.m. 91.7, 97.5 this a.m. No significant change in complaints, uncomfortable all over     Objective   Vital signs in last 24 hours: Temp:  [91.7 F (33.2 C)-98 F (36.7 C)] 97.8 F (36.6 C) (09/24 0826) Pulse Rate:  [40-67] 65 (09/24 0826) Resp:  [8-20] 20 (09/24 0826) BP: (117-153)/(76-97) 139/93 (09/24 0826) SpO2:  [40 %-100 %] 40 % (09/24 0826) Last BM Date: 12/26/19 General:    Acute and chronically ill appearing African-American male, jaundiced Heart:  Regular rate and rhythm; no murmurs Lungs: Respirations even and unlabored, lungs CTA bilaterally Abdomen:  Soft, tender across the upper abdomen nondistended. Normal bowel sounds. Extremities:  Without edema. Neurologic:  Alert and oriented,  grossly normal neurologically. Psych:  Cooperative. Normal mood and affect.  Intake/Output from previous day: 09/23 0701 - 09/24  0700 In: 1627 [P.O.:50; I.V.:960; Blood:367; IV Piggyback:250] Out: 5726 [Urine:1050; Stool:1; Blood:5] Intake/Output this shift: No intake/output data recorded.  Lab Results: Recent Labs    12/26/19 1221 12/11/2019 0345 12/28/19 0722  WBC 12.8* 8.3 10.2  HGB 13.0 10.9* 10.6*  HCT 38.4* 32.4* 31.2*  PLT 14* 43* 79*   BMET Recent Labs    12/26/19 1221 12/11/2019 0345 12/28/19 0722  NA 139 141 139  K 3.6 3.5 3.9  CL 102 105 103  CO2 '28 30 29  ' GLUCOSE 173* 93 86  BUN 28* 21* 26*  CREATININE <0.30* <0.30* <0.30*  CALCIUM 7.8* 7.6* 7.9*   LFT Recent Labs    12/25/19 1712 12/26/19 1221 12/28/19 0722  PROT 6.0*   < > 5.6*  ALBUMIN 2.0*   < > 2.2*  AST 44*   < > 53*  ALT 49*   < > 56*  ALKPHOS 469*   < > 451*  BILITOT 3.3*   < > 3.0*  BILIDIR 1.1*  --   --   IBILI 2.2*  --   --    < > = values in this interval not displayed.   PT/INR Recent Labs    12/15/2019 0345  LABPROT 17.5*  INR 1.5*    Studies/Results: DG Chest Port 1 View  Result Date: 12/24/2019 CLINICAL DATA:  39 year old male, status post thoracentesis EXAM: PORTABLE CHEST 1 VIEW COMPARISON:  Chest CT 12/15/2019, chest x-ray 12/23/2018 FINDINGS: Cardiomediastinal silhouette unchanged in size and contour. Right IJ port catheter unchanged. Micro nodular pattern of opacity throughout the lungs, compatible with findings on the prior  CT. Blunting of the right costophrenic angle persists, with no pneumothorax. No left-sided pleural fluid.  No new confluent airspace disease. IMPRESSION: No complicating features status post right-sided thoracentesis, with persistent small fluid/atelectasis at the right lung base. Micro nodular pattern of opacity of the bilateral lungs compatible with findings on the recent prior CT chest. Unchanged right IJ port catheter. Electronically Signed   By: Corrie Mckusick D.O.   On: 12/22/2019 16:04   DG ERCP  Result Date: 12/26/2019 CLINICAL DATA:  Pancreas cancer, cholangitis, sepsis,  stent occlusion EXAM: ERCP with common bile duct stent revision TECHNIQUE: Multiple spot images obtained with the fluoroscopic device and submitted for interpretation post-procedure. FLUOROSCOPY TIME:  Fluoroscopy Time:  10 minutes 37 seconds Radiation Exposure Index (if provided by the fluoroscopic device): Dense 110 mGy Number of Acquired Spot Images: Numerous spot fluoroscopic view COMPARISON:  12/25/2019 FINDINGS: Spot fluoroscopic views during ERCP confirm wire and catheter traversing the biliary metallic stent. Proximal contrast injection above the stent confirms biliary obstruction from stent occlusion. Endoscopic plastic biliary stent now traverses the previous metallic stent. 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 Successful plastic endoscopic stent placement traversing the proximal biliary tree through the CBD metallic stent. These images were submitted for radiologic interpretation only. Please see the procedural report for the amount of contrast and the fluoroscopy time utilized. Electronically Signed   By: Jerilynn Mages.  Shick M.D.   On: 12/05/2019 13:05   US THORACENTESIS ASP PLEURAL SPACE W/IMG GUIDE  Result Date: 01/02/2020 INDICATION: Patient with history of pancreatic cancer, failure to thrive, and right pleural effusion. Request made for diagnostic and therapeutic right thoracentesis EXAM: ULTRASOUND GUIDED DIAGNOSTIC AND THERAPEUTIC RIGHT THORACENTESIS MEDICATIONS: 8 mL 1% lidocaine COMPLICATIONS: None immediate. PROCEDURE: An ultrasound guided thoracentesis was thoroughly discussed with the patient and questions answered. The benefits, risks, alternatives and complications were also discussed. The patient understands and wishes to proceed with the procedure. Written consent was obtained. Ultrasound was performed to localize and mark an adequate pocket of fluid in the right chest. The area was then prepped and draped in the normal sterile fashion. 1% Lidocaine  was used for local anesthesia. Under ultrasound guidance a 6 Fr Safe-T-Centesis catheter was introduced. Thoracentesis was performed. The catheter was removed and a dressing applied. FINDINGS: A total of approximately 850 mL of dark red fluid was removed. Samples were sent to the laboratory as requested by the clinical team. IMPRESSION: Successful ultrasound guided right thoracentesis yielding 850 mL of pleural fluid. Read by: Earley Abide, PA-C Electronically Signed   By: Corrie Mckusick D.O.   On: 12/05/2019 16:00       Assessment / Plan:    #39 39 year old African-American male with locally advanced pancreatic adenocarcinoma, now metastatic with multiple lung lesions on recent imaging. Initial biliary obstruction 09/2019 with tight mid common bile duct stricture and underwent ERCP with placement of a uncovered biliary stent. Admitted now with progressive weakness lack of appetite, met sepsis criteria  Imaging of the abdomen now shows multiple low attenuation lesions within the liver suspicious for multifocal intrahepatic abscesses, and also had extensive intrahepatic ductal dilation  ERCP yesterday, balloon sweeping of extensive tissue and purulent material, significant tissue ingrowth within the uncovered stent.  A new plastic biliary stent was placed within the existing stent and also into communicating intrahepatic abscess.  Patient has been stable overnight, some improvement in parameters with decrease in WBC and LFTs.  Plan; continue IV meropenem Advance diet as tolerates Continue to  trend LFTs Will plan for repeat CT of the abdomen and pelvis on Sunday, 12/30/2019, to reassess the intrahepatic abscesses and potential need for percutaneous drainage.  If patient has any decline in the interim then proceed with CT sooner.         Principal Problem:   Sepsis secondary to UTI [facility]) Active Problems:   Malignant neoplasm of head of pancreas ([place])   Cancer-related pain   Failure to  thrive in adult   Pulmonary embolism (HCC)   Pleural effusion   Pressure injury of skin     LOS: 5 days   Maribella Kuna EsterwoodPA-C  12/28/2019, 8:41 AM

## 2019-12-28 NOTE — Progress Notes (Signed)
Pharmacy Antibiotic Note  Lenny Fiumara is a 39 y.o. male admitted on 01/03/2020 with known pancreatic cancer presented to ED with weakness.  Pharmacy has been consulted for merrem dosing for UTI and hepatic abscess with ?cholangitis   Day 5 meropenem Afebrile WBC 10.2 SCr stable Urine culture from 9/19: kleb pneumo - only resistant to ampicillin  Plan: Continue meropenem 1g IV q8 per current renal function Would recommend narrowing abx's to cipro/flagyl to treat UTI and abscess/cholangitis Follow renal function, cultures and clinical course   Height:  (5'8") Weight: 39.6 kg (87 lb 4.8 oz) IBW/kg (Calculated) : 68.4  Temp (24hrs), Avg:96.5 F (35.8 C), Min:91.7 F (33.2 C), Max:98 F (36.7 C)  Recent Labs  Lab 12/09/2019 1036 12/05/2019 1236 12/20/2019 1925 12/24/19 0420 12/24/19 0420 12/25/19 0458 12/25/19 1353 12/26/19 1221 12/28/2019 0345 12/28/19 0722  WBC 38.7*  --    < > 25.1*   < > 14.2* 13.1* 12.8* 8.3 10.2  CREATININE 0.59*  --   --  0.42*  --   --   --  <0.30* <0.30* <0.30*  LATICACIDVEN  --  1.5  --   --   --   --   --   --   --   --    < > = values in this interval not displayed.    CrCl cannot be calculated (This lab value cannot be used to calculate CrCl because it is not a number: <0.30).    Allergies  Allergen Reactions  . Penicillins Swelling    Patient reports some swelling and shortness of breath within the past 10 years. Per mother, he had no reactions to penicillin as a child.      Thank you for allowing pharmacy to be a part of this patient's care.  Adrian Saran, PharmD, BCPS [phone] until 3pm 12/28/2019 8:51 AM

## 2019-12-29 LAB — HEPATIC FUNCTION PANEL
ALT: 51 U/L — ABNORMAL HIGH (ref 0–44)
AST: 39 U/L (ref 15–41)
Albumin: 2 g/dL — ABNORMAL LOW (ref 3.5–5.0)
Alkaline Phosphatase: 406 U/L — ABNORMAL HIGH (ref 38–126)
Bilirubin, Direct: 0.7 mg/dL — ABNORMAL HIGH (ref 0.0–0.2)
Indirect Bilirubin: 1.8 mg/dL — ABNORMAL HIGH (ref 0.3–0.9)
Total Bilirubin: 2.5 mg/dL — ABNORMAL HIGH (ref 0.3–1.2)
Total Protein: 5.2 g/dL — ABNORMAL LOW (ref 6.5–8.1)

## 2019-12-29 LAB — COMPREHENSIVE METABOLIC PANEL
ALT: 54 U/L — ABNORMAL HIGH (ref 0–44)
AST: 42 U/L — ABNORMAL HIGH (ref 15–41)
Albumin: 2 g/dL — ABNORMAL LOW (ref 3.5–5.0)
Alkaline Phosphatase: 424 U/L — ABNORMAL HIGH (ref 38–126)
Anion gap: 5 (ref 5–15)
BUN: 25 mg/dL — ABNORMAL HIGH (ref 6–20)
CO2: 30 mmol/L (ref 22–32)
Calcium: 7.8 mg/dL — ABNORMAL LOW (ref 8.9–10.3)
Chloride: 103 mmol/L (ref 98–111)
Creatinine, Ser: 0.3 mg/dL — ABNORMAL LOW (ref 0.61–1.24)
GFR calc Af Amer: >60 mL/min (ref >60)
GFR calc non Af Amer: >60 mL/min (ref >60)
Glucose, Bld: 77 mg/dL (ref 70–99)
Potassium: 3.9 mmol/L (ref 3.5–5.1)
Sodium: 138 mmol/L (ref 135–145)
Total Bilirubin: 2.5 mg/dL — ABNORMAL HIGH (ref 0.3–1.2)
Total Protein: 5.6 g/dL — ABNORMAL LOW (ref 6.5–8.1)

## 2019-12-29 LAB — CBC
HCT: 28.8 % — ABNORMAL LOW (ref 39.0–52.0)
Hemoglobin: 9.6 g/dL — ABNORMAL LOW (ref 13.0–17.0)
MCH: 27.7 pg (ref 26.0–34.0)
MCHC: 33.3 g/dL (ref 30.0–36.0)
MCV: 83 fL (ref 80.0–100.0)
Platelets: 91 10*3/uL — ABNORMAL LOW (ref 150–400)
RBC: 3.47 MIL/uL — ABNORMAL LOW (ref 4.22–5.81)
RDW: 14.4 % (ref 11.5–15.5)
WBC: 7.7 10*3/uL (ref 4.0–10.5)
nRBC: 0 % (ref 0.0–0.2)

## 2019-12-29 LAB — BPAM PLATELET PHERESIS
Blood Product Expiration Date: 202109242359
Blood Product Expiration Date: 202109252359
ISSUE DATE / TIME: 202109230844
Unit Type and Rh: 5100
Unit Type and Rh: 6200

## 2019-12-29 LAB — PREPARE PLATELET PHERESIS
Unit division: 0
Unit division: 0

## 2019-12-29 NOTE — Progress Notes (Signed)
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 Ardis Hughs tight mid biliary structure 6 cm / 10 cm uncovered biliary stent was placed in June 2021 he has been scheduled for radiation therapy presented with decreased appetite generalized weakness and was altered and confused on admission 12/14/2019 Notable labs BUNs/creatinine 51/0.5 AST/ALT 62/61 alk phos 471 WBC 38 Platelets were 11,000 on admission CT chest small subsegmental PE moderate right-sided effusion CT abdomen pelvis multiple no attenuating lesions?  Hepatic abscesses 3.6 X3.5 Sepsis from biliary versus urinary source entertained GI consulted ERCP performed 9/23 as below  Assessment & Plan:   Principal Problem:   Sepsis secondary to UTI [facility]) Active Problems:   Malignant neoplasm of head of pancreas ([place])   Cancer-related pain   Failure to thrive in adult   Pulmonary embolism (HCC)   Pleural effusion   Pressure injury of skin   Thrombocytopenia ([place])   1. Sepsis on admission with hepatic abscesses +cholangitis?  Growing Klebsiella in urine-blood cultures not done on admission a. Transition 9/24 from meropenem to Cipro and Flagyl-stop date 01/06/2020 for 14 days at least b. Imaging as per GI c. Extensive ERCP and instrumentation with plastic stent placement 9/23 Dr. Ardis Hughs shows significant purulence 2. Probable metastatic pancreatic cancer on palliative gemcitabine Abraxane SBRT a. Further planning re: chemoradiation to oncology b. CT chest showed pulmonary mets small pleural effusion and retroperitoneal lymphadenopathy c. Family does not seem prepared for goals of care discussions at this  time d. Cytology of pleural fluid 9/24 pending e. Need further d/w Oncology re: Cancer planning  3. Small subsegmental pulmonary embolism a. Not really a candidate for anticoagulation at this time b. Oncology recommends holding off on IVC filter placement no anticoagulation currently c. Monitor and discuss as an outpatient 4. Moderate right pleural effusion a. See above discussion-thoracentesis performed 9/23 b. Rpt CXR non emergently 5. Severe thrombocytopenia a. Platelet counts better 2/2 resolution of severe sepsis with probable DIC b. See above  DVT prophylaxis: SCD Code Status: Full code at this time Family Communication: Long discussion with wife/girlfriend and patient and mother who is present in the room today and discussed that we can probably plan for discharge if patient is pain-free and if patient has resources on discharge for placement-wife works outside of the home and is not there 24/7-patient cannot take care of himself and is still requiring some IV pain meds although pain meds have been deescalated to home regimen We will need to determine if he can discharge safely home once imaging is done in the next several days and follow-up as an outpatient otherwise oncology to make planning decisions here regarding options for care Disposition:   Status is: Inpatient  Remains inpatient appropriate because:Persistent severe electrolyte disturbances, Altered mental status, Ongoing diagnostic testing needed not appropriate for outpatient work up and IV treatments appropriate due to intensity of illness or inability to take PO   Dispo: The patient is from: Home              Anticipated d/c is to: Unclear unclear              Anticipated d/c date is: 2 days  Patient currently is not medically stable to d/c.  Disposition is unclear-patient has advanced disease and it is unclear if he has further options he will need further discussion with his oncologist regarding  further planning     Consultants:   Dr. Benay Spice oncology  Dr. Ardis Hughs gastroenterology  Dr. Domingo Cocking palliative  Procedures: ERCP 9/23 Impression:               - Extensive tissue ingrowth in the previously                            placed metal biliary stent.                           - Near complete blockage of the proixmal aspect of                            the metal bilairy stent, probably from tissue                            ingrowth.                           - Two of the suspected liver abscesses were evident                            on cholangiogram, with apparent direct                            communication with the right biliary system.                           - Significant purulence noted throughout the                            examination.                           The above was treated with biliary balloon sweeping                            and then eventual placement of a 9cm long 10Fr                            plastic biliary stent through the existing metal                            stent and into the right biliary system with the                            proximal aspect of the stent located in one of the                            presumed liver abscesses and the distal end located  in the duodenum. It is not clear if this will help                            resolve all of the infection in his liver.  Antimicrobials: Meropenem  Subjective:  Awake coherent pleasant Tolerating minimal diet Has not been out of bed but is mainly using MSIR not IV pain meds No chest pain  Objective: Vitals:   12/29/19 0401 12/29/19 0801 12/29/19 1000 12/29/19 1208  BP: (!) 127/98 (!) 126/99 (!) 127/96 (!) 122/95  Pulse: 70 62 92 83  Resp:  '15 17 16  ' Temp: 98.3 F (36.8 C) (!) 97 F (36.1 C) 97.6 F (36.4 C) 97.7 F (36.5 C)  TempSrc: Oral Oral Oral Oral  SpO2: 100% 100% 100% 100%  Weight:      Height:         Intake/Output Summary (Last 24 hours) at 12/29/2019 1453 Last data filed at 12/29/2019 0900 Gross per 24 hour  Intake 406.77 ml  Output 500 ml  Net -93.23 ml   Filed Weights   12/24/19 1440  Weight: 39.6 kg    Examination:  Coherent no distress Multiple tattoos Very frail cachectic Chest clear no added sound Abdomen soft No rebound or guarding   Data Reviewed: I have personally reviewed following labs and imaging studies  Sodium 139-->138 BUN/creatinine 26/0.3-9 g and 25/0.3 Alk phos 424 AST/ALT 68/58-->53/56-->42/54 Bilirubin 5.7-->3.0-->2.5 White count 10.2-->7.7 Hemoglobin 10.6---9.6 Platelet 14-->43-->79-->91  Radiology Studies: DG Chest Port 1 View  Result Date: 12/30/2019 CLINICAL DATA:  39 year old male, status post thoracentesis EXAM: PORTABLE CHEST 1 VIEW COMPARISON:  Chest CT 12/20/2019, chest x-ray 12/23/2018 FINDINGS: Cardiomediastinal silhouette unchanged in size and contour. Right IJ port catheter unchanged. Micro nodular pattern of opacity throughout the lungs, compatible with findings on the prior CT. Blunting of the right costophrenic angle persists, with no pneumothorax. No left-sided pleural fluid.  No new confluent airspace disease. IMPRESSION: No complicating features status post right-sided thoracentesis, with persistent small fluid/atelectasis at the right lung base. Micro nodular pattern of opacity of the bilateral lungs compatible with findings on the recent prior CT chest. Unchanged right IJ port catheter. Electronically Signed   By: Corrie Mckusick D.O.   On: 12/10/2019 16:04   US THORACENTESIS ASP PLEURAL SPACE W/IMG GUIDE  Result Date: 12/19/2019 INDICATION: Patient with history of pancreatic cancer, failure to thrive, and right pleural effusion. Request made for diagnostic and therapeutic right thoracentesis EXAM: ULTRASOUND GUIDED DIAGNOSTIC AND THERAPEUTIC RIGHT THORACENTESIS MEDICATIONS: 8 mL 1% lidocaine COMPLICATIONS: None immediate.  PROCEDURE: An ultrasound guided thoracentesis was thoroughly discussed with the patient and questions answered. The benefits, risks, alternatives and complications were also discussed. The patient understands and wishes to proceed with the procedure. Written consent was obtained. Ultrasound was performed to localize and mark an adequate pocket of fluid in the right chest. The area was then prepped and draped in the normal sterile fashion. 1% Lidocaine was used for local anesthesia. Under ultrasound guidance a 6 Fr Safe-T-Centesis catheter was introduced. Thoracentesis was performed. The catheter was removed and a dressing applied. FINDINGS: A total of approximately 850 mL of dark red fluid was removed. Samples were sent to the laboratory as requested by the clinical team. IMPRESSION: Successful ultrasound guided right thoracentesis yielding 850 mL of pleural fluid. Read by: Earley Abide, PA-C Electronically Signed   By: Corrie Mckusick D.O.   On: 12/16/2019 16:00     Scheduled  Meds: . (feeding supplement) PROSource Plus  30 mL Oral TID BM  . sodium chloride   Intravenous Once  . Chlorhexidine Gluconate Cloth  6 each Topical Daily  . feeding supplement  1 Container Oral TID BM  . indomethacin  100 mg Rectal Once  . morphine  60 mg Oral Q12H  . multivitamin with minerals  1 tablet Oral Daily  . nystatin  5 mL Oral QID  . sodium chloride flush  3 mL Intravenous Q12H   Continuous Infusions: . ciprofloxacin 400 mg (12/29/19 0300)  . dextrose 5 % and 0.9% NaCl 60 mL/hr at 12/29/19 0930  . metronidazole 500 mg (12/29/19 1149)     LOS: 6 days    Time spent: 25  Nita Sells, MD [facility] To contact the attending provider between 7A-7P or the covering provider during after hours 7P-7A, please log into the web site [email or web address] and access using universal [place] password for that web site. If you do not have the password, please call the hospital operator.  12/29/2019, 2:53 PM

## 2019-12-30 ENCOUNTER — Ambulatory Visit: Payer: Medicaid Other

## 2019-12-30 ENCOUNTER — Inpatient Hospital Stay (HOSPITAL_COMMUNITY): Payer: Medicaid Other

## 2019-12-30 ENCOUNTER — Ambulatory Visit: Payer: Medicaid Other | Admitting: Radiation Oncology

## 2019-12-30 LAB — COMPREHENSIVE METABOLIC PANEL
ALT: 53 U/L — ABNORMAL HIGH (ref 0–44)
AST: 37 U/L (ref 15–41)
Albumin: 1.9 g/dL — ABNORMAL LOW (ref 3.5–5.0)
Alkaline Phosphatase: 376 U/L — ABNORMAL HIGH (ref 38–126)
Anion gap: 2 — ABNORMAL LOW (ref 5–15)
BUN: 21 mg/dL — ABNORMAL HIGH (ref 6–20)
CO2: 29 mmol/L (ref 22–32)
Calcium: 7.5 mg/dL — ABNORMAL LOW (ref 8.9–10.3)
Chloride: 105 mmol/L (ref 98–111)
Creatinine, Ser: <0.3 mg/dL — ABNORMAL LOW (ref 0.61–1.24)
Glucose, Bld: 79 mg/dL (ref 70–99)
Potassium: 3.9 mmol/L (ref 3.5–5.1)
Sodium: 136 mmol/L (ref 135–145)
Total Bilirubin: 2 mg/dL — ABNORMAL HIGH (ref 0.3–1.2)
Total Protein: 5.2 g/dL — ABNORMAL LOW (ref 6.5–8.1)

## 2019-12-30 LAB — CBC WITH DIFFERENTIAL/PLATELET
Abs Immature Granulocytes: 0.03 10*3/uL (ref 0.00–0.07)
Basophils Absolute: 0 10*3/uL (ref 0.0–0.1)
Basophils Relative: 0 %
Eosinophils Absolute: 0 10*3/uL (ref 0.0–0.5)
Eosinophils Relative: 0 %
HCT: 29.4 % — ABNORMAL LOW (ref 39.0–52.0)
Hemoglobin: 9.7 g/dL — ABNORMAL LOW (ref 13.0–17.0)
Immature Granulocytes: 0 %
Lymphocytes Relative: 9 %
Lymphs Abs: 0.7 10*3/uL (ref 0.7–4.0)
MCH: 27.7 pg (ref 26.0–34.0)
MCHC: 33 g/dL (ref 30.0–36.0)
MCV: 84 fL (ref 80.0–100.0)
Monocytes Absolute: 0.3 10*3/uL (ref 0.1–1.0)
Monocytes Relative: 4 %
Neutro Abs: 7 10*3/uL (ref 1.7–7.7)
Neutrophils Relative %: 87 %
Platelets: 102 10*3/uL — ABNORMAL LOW (ref 150–400)
RBC: 3.5 MIL/uL — ABNORMAL LOW (ref 4.22–5.81)
RDW: 14.5 % (ref 11.5–15.5)
WBC: 8 10*3/uL (ref 4.0–10.5)
nRBC: 0 % (ref 0.0–0.2)

## 2019-12-30 NOTE — Evaluation (Signed)
Physical Therapy Evaluation Patient Details Name: Gabriel [name] MRN: [id number] DOB: [birth year] Today's Date: 12/30/2019   History of Present Illness  39 yo male admitted with UTI, sepsis, weakness, PE. Recent dx of pancreatic cancer  Clinical Impression  On eval, pt required Mod assist +2 for bed mobility. He was able to sit EOB for several minutes. RN entered and informed therapist that pt's HR was up to 160s (pt was sitting EOB at the time). Max encouragement required for pt to put forth full effort and to at least try before saying "I can't." For half the session, pt was distracted by condom catheter discomfort. Eventually able to get NT in to assess catheter and help drain urine from condom portion. Pt was then willing to mobilize. Increased time and repeated cueing required for 1 step tasks. Unsure of pt's d/c plan. Per pt's mother, pt's wife works during the day. Will plan to follow and progress activity as able. Encouraged pt to try to participate more, with nursing and PT, when mobilizing.     Follow Up Recommendations SNF vs Supervision/Assistance - 24 hour (depending on pt/family decision)    Equipment Recommendations  Hospital bed;Wheelchair    Recommendations for Other Services       Precautions / Restrictions Precautions Precautions: Fall Restrictions Weight Bearing Restrictions: No      Mobility  Bed Mobility Overal bed mobility: Needs Assistance Bed Mobility: Supine to Sit;Sit to Supine     Supine to sit: HOB elevated;Max assist Sit to supine: HOB elevated;Mod assist;+2 for physical assistance;+2 for safety/equipment   General bed mobility comments: Assist for trunk and bil LEs. Utilized bedpad to aid with scooting, positioning. Increased time and repeated cueing required. Max encouragement for pt to put forth effort before saying "I can't."  Transfers                 General transfer comment: NT- for safety reasons (too weak, +2 not  available)  Ambulation/Gait                Stairs            Wheelchair Mobility    Modified Rankin (Stroke Patients Only)       Balance Overall balance assessment: Needs assistance Sitting-balance support: Bilateral upper extremity supported;Feet unsupported Sitting balance-Leahy Scale: Poor Sitting balance - Comments: Pt was able to sit unsupported at EOB for ~5 minutes. Pt was unable/unwilling? to attempt reaching to challenge balance                                     Pertinent Vitals/Pain Pain Assessment: Faces Faces Pain Scale: Hurts even more Pain Location: catheter site?  vague about pain locations Pain Descriptors / Indicators: Discomfort Pain Intervention(s): Monitored during session;Repositioned    Home Living Family/patient expects to be discharged to:: Private residence Living Arrangements: Spouse/significant other Available Help at Discharge: Family;Available PRN/intermittently Type of Home: House                Prior Function Level of Independence: Needs assistance   Gait / Transfers Assistance Needed: was ambulatory up until some point prior to admission.  ADL's / Homemaking Assistance Needed: unsure  Comments: history unclear at times     Hand Dominance        Extremity/Trunk Assessment   Upper Extremity Assessment Upper Extremity Assessment: Defer to OT evaluation    Lower Extremity Assessment Lower Extremity  Assessment:  (Strength 3-/5 knee, ankle; hip 2/5)    Cervical / Trunk Assessment Cervical / Trunk Assessment: Normal  Communication   Communication: No difficulties  Cognition Arousal/Alertness: Awake/alert Behavior During Therapy: WFL for tasks assessed/performed Overall Cognitive Status: No family/caregiver present to determine baseline cognitive functioning                                 General Comments: increased time to respond to questions. easily distracted. tends to  answer with " i can't" often, even before attempting movement      General Comments      Exercises Total Joint Exercises Long Arc Quad: AROM;Both;5 reps;Seated   Assessment/Plan    PT Assessment Patient needs continued PT services  PT Problem List Decreased strength;Decreased mobility;Decreased activity tolerance;Decreased balance;Decreased knowledge of use of DME;Pain;Decreased cognition;Decreased safety awareness       PT Treatment Interventions DME instruction;Therapeutic activities;Patient/family education;Therapeutic exercise;Balance training;Functional mobility training;Gait training    PT Goals (Current goals can be found in the Care Plan section)  Acute Rehab PT Goals Patient Stated Goal: to move around PT Goal Formulation: With patient/family Time For Goal Achievement: 01/13/20 Potential to Achieve Goals: Poor    Frequency Min 2X/week   Barriers to discharge        Co-evaluation               AM-PAC PT "6 Clicks" Mobility  Outcome Measure Help needed turning from your back to your side while in a flat bed without using bedrails?: Total Help needed moving from lying on your back to sitting on the side of a flat bed without using bedrails?: Total Help needed moving to and from a bed to a chair (including a wheelchair)?: Total Help needed standing up from a chair using your arms (e.g., wheelchair or bedside chair)?: Total Help needed to walk in hospital room?: Total Help needed climbing 3-5 steps with a railing? : Total 6 Click Score: 6    End of Session   Activity Tolerance: Patient tolerated treatment well Patient left: in bed;with call bell/phone within reach;with bed alarm set;with family/visitor present   PT Visit Diagnosis: Muscle weakness (generalized) (M62.81);Pain;Other abnormalities of gait and mobility (R26.89);Difficulty in walking, not elsewhere classified (R26.2)    Time: 5885-0277 PT Time Calculation (min) (ACUTE ONLY): 47  min   Charges:   PT Evaluation $PT Eval Moderate Complexity: 1 Mod            Doreatha Massed, PT Acute Rehabilitation  Office: [phone] Pager: [phone]

## 2019-12-30 NOTE — Progress Notes (Signed)
PROGRESS NOTE    Gabriel [name]  YHC:[id number] DOB: 10-11-1980 DOA: 12/13/2019 PCP: Kerin Perna, NP  Brief Narrative:  39 year old black male Diagnosed with stage Ib T2N0MX pancreatic cancer 910 2021.5 cm mass-EUS 9/24 2.5 cm mass biopsy = adenocarcinoma-not a candidate for surgery due to SMA involvement-underwent palliative gemcitabine and Abraxane- Previous ERCP with Dr. Ardis Hughs tight mid biliary structure 6 cm / 10 cm uncovered biliary stent was placed in June 2021 he has been scheduled for radiation therapy presented with decreased appetite generalized weakness and was altered and confused on admission 12/18/2019 Notable labs BUNs/creatinine 51/0.5 AST/ALT 62/61 alk phos 471 WBC 38 Platelets were 11,000 on admission CT chest small subsegmental PE moderate right-sided effusion CT abdomen pelvis multiple no attenuating lesions?  Hepatic abscesses 3.6 X3.5 Sepsis from biliary versus urinary source entertained GI consulted ERCP performed 9/23 as below Patient has done somewhat better since admission and LFTs and base lab parameters are improved Still await CT abd pelvis   Assessment & Plan:   Principal Problem:   Sepsis secondary to UTI [facility]) Active Problems:   Malignant neoplasm of head of pancreas ([place])   Cancer-related pain   Failure to thrive in adult   Pulmonary embolism (HCC)   Pleural effusion   Pressure injury of skin   Thrombocytopenia ([place])   1. Sepsis on admission with hepatic abscesses +cholangitis?  Growing Klebsiella in urine-blood cultures not done on admission a. Transition 9/24 from meropenem to Cipro and Flagyl-stop date 01/06/2020 for 14 days at least b. Extensive ERCP and instrumentation with plastic stent placement 9/23 Dr. Ardis Hughs shows significant purulence 2. Probable metastatic pancreatic cancer on palliative gemcitabine Abraxane SBRT a. Further planning re: chemoradiation to oncology b. CT abd pelv rpt for 9/26 pending c. Family does not seem  prepared for goals of care discussions at this time d. Cytology of pleural fluid 9/24 pending--Rpt CXR shows persisting effusion e. Need further d/w Oncology re: Cancer planning  3. Small subsegmental pulmonary embolism a. Not really a candidate for anticoagulation at this time--some eveidnce points to also not anticoagulating small subsegmental emboli b. Oncology recommends holding off on IVC filter placement no anticoagulation currently c. Monitor and discuss as an outpatient 4. Moderate right pleural effusion a. See above discussion-thoracentesis performed 9/23 b. Rpt CXR non emergently 5. Severe thrombocytopenia a. Platelet counts better 2/2 resolution of severe sepsis with probable DIC b. See above  DVT prophylaxis: SCD Code Status: Full code at this time Family Communication: Discussed with mother in person and with wife on phone  Disposition:   Status is: Inpatient  Remains inpatient appropriate because:Persistent severe electrolyte disturbances, Altered mental status, Ongoing diagnostic testing needed not appropriate for outpatient work up and IV treatments appropriate due to intensity of illness or inability to take PO   Dispo: The patient is from: Home              Anticipated d/c is to: Unclear unclear              Anticipated d/c date is: 2 days              Patient currently is not medically stable to d/c.  Disposition is unclear-patient has advanced disease and it is unclear if he has further options  He hasn't been oob, we have requested therapy eval--He seems to be total care and it is unclear if he can be cared for at home indpeendantly     Consultants:   Dr. Benay Spice oncology  Dr. Ardis Hughs gastroenterology  Dr. Domingo Cocking palliative  Procedures: ERCP 9/23 Impression:               - Extensive tissue ingrowth in the previously                            placed metal biliary stent.                           - Near complete blockage of the proixmal aspect of                              the metal bilairy stent, probably from tissue                            ingrowth.                           - Two of the suspected liver abscesses were evident                            on cholangiogram, with apparent direct                            communication with the right biliary system.                           - Significant purulence noted throughout the                            examination.                           The above was treated with biliary balloon sweeping                            and then eventual placement of a 9cm long 10Fr                            plastic biliary stent through the existing metal                            stent and into the right biliary system with the                            proximal aspect of the stent located in one of the                            presumed liver abscesses and the distal end located                            in the duodenum. It is not clear if this will help  resolve all of the infection in his liver.   CT scan abdomen pelvis 9/26 this patient go for CT of the abdomen pelvis today or not if not can you confirm that it is scheduled  Antimicrobials: Meropenem  Subjective:  Awake coherent pleasant Tolerating more of a diet Has not been out of bed but is mainly using MSIR not IV pain meds No chest pain eating some better  Objective: Vitals:   12/29/19 2100 12/30/19 0504 12/30/19 1055 12/30/19 1213  BP: (!) 128/91 (!) 124/92 111/89 (!) 120/97  Pulse: 74 75 (!) 128 92  Resp:  '16 17 19  ' Temp: 97.9 F (36.6 C) 97.7 F (36.5 C) (!) 97.1 F (36.2 C) 98 F (36.7 C)  TempSrc: Oral  Oral Oral  SpO2: 99% 100% 99% 100%  Weight:      Height:        Intake/Output Summary (Last 24 hours) at 12/30/2019 1450 Last data filed at 12/30/2019 1011 Gross per 24 hour  Intake --  Output 500 ml  Net -500 ml   Filed Weights   12/24/19 1440  Weight: 39.6 kg     Examination:  Coherent no distress Chest clear no added sound Abdomen soft no rebound no gaurd No rebound or guarding   Data Reviewed: I have personally reviewed following labs and imaging studies  Sodium 139-->136 BUN/creatinine 26/0.3-9 g and 25/0.3-->21/0.3 Alk phos 424-->376 AST/ALT 68/58-->53/56-->42/54-->37/53 Bilirubin 5.7-->3.0-->2.5-->2.0 White count 10.2-->8.0 Hemoglobin 10.6---9.7 Platelet 14-->43-->79-->91-->102  Radiology Studies: DG Chest 2 View  Result Date: 12/30/2019 CLINICAL DATA:  Pancreatic cancer with right-sided pleural effusion. EXAM: CHEST - 2 VIEW COMPARISON:  December 27, 2019 FINDINGS: Injectable port in stable position. Cardiomediastinal silhouette is normal. Mediastinal contours appear intact. Stable right pleural effusion. Multiple nodular opacities, predominantly in the lung bases persist. Osseous structures are without acute abnormality. Soft tissues are grossly normal. IMPRESSION: Stable moderate right pleural effusion. Stable appearance of multiple nodular opacities throughout both lungs. Electronically Signed   By: Fidela Salisbury M.D.   On: 12/30/2019 10:56     Scheduled Meds: . (feeding supplement) PROSource Plus  30 mL Oral TID BM  . sodium chloride   Intravenous Once  . Chlorhexidine Gluconate Cloth  6 each Topical Daily  . feeding supplement  1 Container Oral TID BM  . indomethacin  100 mg Rectal Once  . morphine  60 mg Oral Q12H  . multivitamin with minerals  1 tablet Oral Daily  . nystatin  5 mL Oral QID  . sodium chloride flush  3 mL Intravenous Q12H   Continuous Infusions: . ciprofloxacin 400 mg (12/30/19 1533)  . dextrose 5 % and 0.9% NaCl 60 mL/hr at 12/30/19 0201  . metronidazole 500 mg (12/30/19 1126)     LOS: 7 days    Time spent: [place], MD [facility] To contact the attending provider between 7A-7P or the covering provider during after hours 7P-7A, please log into the web site  [email or web address] and access using universal [place] password for that web site. If you do not have the password, please call the hospital operator.  12/30/2019, 2:50 PM

## 2019-12-31 ENCOUNTER — Inpatient Hospital Stay (HOSPITAL_COMMUNITY): Payer: Medicaid Other

## 2019-12-31 LAB — COMPREHENSIVE METABOLIC PANEL
ALT: 61 U/L — ABNORMAL HIGH (ref 0–44)
AST: 76 U/L — ABNORMAL HIGH (ref 15–41)
Albumin: 1.8 g/dL — ABNORMAL LOW (ref 3.5–5.0)
Alkaline Phosphatase: 408 U/L — ABNORMAL HIGH (ref 38–126)
Anion gap: 6 (ref 5–15)
BUN: 21 mg/dL — ABNORMAL HIGH (ref 6–20)
CO2: 26 mmol/L (ref 22–32)
Calcium: 7.5 mg/dL — ABNORMAL LOW (ref 8.9–10.3)
Chloride: 104 mmol/L (ref 98–111)
Creatinine, Ser: 0.3 mg/dL — ABNORMAL LOW (ref 0.61–1.24)
GFR calc Af Amer: >60 mL/min (ref >60)
GFR calc non Af Amer: >60 mL/min (ref >60)
Glucose, Bld: 89 mg/dL (ref 70–99)
Potassium: 3.7 mmol/L (ref 3.5–5.1)
Sodium: 136 mmol/L (ref 135–145)
Total Bilirubin: 3.3 mg/dL — ABNORMAL HIGH (ref 0.3–1.2)
Total Protein: 5 g/dL — ABNORMAL LOW (ref 6.5–8.1)

## 2019-12-31 LAB — CBC WITH DIFFERENTIAL/PLATELET
Abs Immature Granulocytes: 0.05 10*3/uL (ref 0.00–0.07)
Basophils Absolute: 0 10*3/uL (ref 0.0–0.1)
Basophils Relative: 0 %
Eosinophils Absolute: 0 10*3/uL (ref 0.0–0.5)
Eosinophils Relative: 0 %
HCT: 30.2 % — ABNORMAL LOW (ref 39.0–52.0)
Hemoglobin: 10.1 g/dL — ABNORMAL LOW (ref 13.0–17.0)
Immature Granulocytes: 0 %
Lymphocytes Relative: 0 %
Lymphs Abs: 0.1 10*3/uL — ABNORMAL LOW (ref 0.7–4.0)
MCH: 28 pg (ref 26.0–34.0)
MCHC: 33.4 g/dL (ref 30.0–36.0)
MCV: 83.7 fL (ref 80.0–100.0)
Monocytes Absolute: 0.1 10*3/uL (ref 0.1–1.0)
Monocytes Relative: 0 %
Neutro Abs: 18.6 10*3/uL — ABNORMAL HIGH (ref 1.7–7.7)
Neutrophils Relative %: 100 %
Platelets: 51 10*3/uL — ABNORMAL LOW (ref 150–400)
RBC: 3.61 MIL/uL — ABNORMAL LOW (ref 4.22–5.81)
RDW: 14.3 % (ref 11.5–15.5)
WBC: 18.8 10*3/uL — ABNORMAL HIGH (ref 4.0–10.5)
nRBC: 0 % (ref 0.0–0.2)

## 2019-12-31 LAB — CYTOLOGY - NON PAP

## 2019-12-31 MED ORDER — METOPROLOL SUCCINATE ER 25 MG PO TB24
12.5000 mg | ORAL_TABLET | Freq: Every day | ORAL | Status: DC
  Administered 2019-12-31 – 2020-01-03 (×3): 12.5 mg via ORAL
  Filled 2019-12-31 (×4): qty 1

## 2019-12-31 MED ORDER — IOHEXOL 9 MG/ML PO SOLN
ORAL | Status: AC
  Filled 2019-12-31: qty 1000

## 2019-12-31 MED ORDER — IOHEXOL 300 MG/ML  SOLN
100.0000 mL | Freq: Once | INTRAMUSCULAR | Status: AC | PRN
  Administered 2019-12-31: 100 mL via INTRAVENOUS

## 2019-12-31 MED ORDER — IOHEXOL 9 MG/ML PO SOLN
500.0000 mL | ORAL | Status: AC
  Administered 2019-12-31 (×2): 500 mL via ORAL

## 2019-12-31 NOTE — Progress Notes (Signed)
Imaging reviewed by Dr. Ardis Hughs and feel hepatic abscesses present warranting possible IR drain I went upstairs subsequent to my epic chat discussion with oncology and gastroenterology and sat down with him and his mom and had a long discussion I have explained to them in plain terms that this decision for drain may palliate things but is not a cure and we do need to think carefully about placing the drain as this is something that does need to be managed at home Mother relates that they were already considering options of possible hospice Patient himself is somewhat quiet and withdrawn and does not offer much in terms of response and wants to contemplate this with his partner which is perfectly reasonable I placed  a consult for potential IR drain to be placed and I have explained what hospice might look like going forward at home and explained the philosophy to some degree in terms of pain management and symptom control but not curative measures such as antibiotics etc. I pointed out that last night patient became tachycardic and that with multiple new lab abnormalities he may be developing further decompensation in an already frail cancer related state We will try to touch base with partner and I wanted to give him the option to do think through their options however I think they are understanding of the same-he is still a full code and this may need to be delineated going forward  I will ask palliative care to check in probably in the morning to see where they are with decision-making  Verneita Griffes, MD [facility] 4:41 PM

## 2019-12-31 NOTE — Progress Notes (Signed)
MEWS score of 3 due to BP 93/76 and HR 125. Patient runs tachy at times and went up in the 160's last night. NP Blount was notified and no orders received. He has been fluctuating in the low 100's and 160's at times. He is currently in no obvious distress and is watching TV with his mother. Will continue MEWS protocol

## 2019-12-31 NOTE — Progress Notes (Addendum)
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 Jacobs tight mid biliary structure 6 cm / 10 cm uncovered biliary stent was placed in June 2021 he has been scheduled for radiation therapy presented with decreased appetite generalized weakness and was altered and confused on admission 12/21/2019 Notable labs BUNs/creatinine 51/0.5 AST/ALT 62/61 alk phos 471 WBC 38 Platelets were 11,000 on admission CT chest small subsegmental PE moderate right-sided effusion CT abdomen pelvis multiple no attenuating lesions?  Hepatic abscesses 3.6 X3.5 Sepsis from biliary versus urinary source entertained GI consulted ERCP performed 9/23 in addition to thoracentesis on the same day as below  Still await CT abd pelvis as well as cytology from thoracentesis from 9/23  He improved to some degree however his labs have worsened again 9/27  Assessment & Plan:   Principal Problem:   Sepsis secondary to UTI (HCC) Active Problems:   Malignant neoplasm of head of pancreas (HCC)   Cancer-related pain   Failure to thrive in adult   Pulmonary embolism (HCC)   Pleural effusion   Pressure injury of skin   Thrombocytopenia (HCC)   1. Sepsis on admission with hepatic abscesses +cholangitis?  Growing Klebsiella in urine-blood cultures not done on admission a. Transition 9/24 from meropenem to Cipro and Flagyl-stop date 01/06/2020 for 14 days at least b. White count has risen LFTs have risen platelets have dropped indicating either worsening infection or other intra-abdominal process-defer to GI if other procedures are planned c. Already had extensive ERCP and instrumentation with  plastic stent placement 9/23 Dr. Jacobs with significant purulence-at that time extensive clogging of the stent was placed in a plastic stent had to be placed 2. Probable metastatic pancreatic cancer on palliative gemcitabine Abraxane SBRT a. Further planning re: chemoradiation to oncology b. CT abd pelv rpt for 9/26 pending c. Family including patient and wife have been counseled several times by hospitalist team as well as oncology that no therapy will be curative and he may not be a candidate for systemic chemo unless performance status improves and they recommended hospice d. Cytology of pleural fluid 9/24 pending--Rpt CXR shows persisting effusion 3. SVT overnight 9/26 a. Long run of SVT overnight to go with tachycardia b. Strict control limited by hypotension but start Toprol-XL 12.5 get magnesium in a.m. 4. Small subsegmental pulmonary embolism a. Not really a candidate for anticoagulation at this time--some evidence points to also not anticoagulating small subsegmental emboli b. Oncology recommends holding off on IVC filter placement no anticoagulation currently c. Monitor and discuss as an outpatient 5. Moderate right pleural effusion a. See above discussion-thoracentesis performed 9/23 b. Rpt CXR non emergently 6. Sacral decubiti a. Secondary to poor mobility b. Mattress work-up ordered c. Will need hospital bed and turning quite frequently 7. Severe thrombocytopenia a. Platelet counts better 2/2 resolution of severe sepsis with probable DIC b. See above  DVT prophylaxis: SCD Code Status: Full code at this time Family Communication: Discussed with mother in person and with wife on phone  Disposition:   Status is: Inpatient  Remains inpatient appropriate because:Persistent severe electrolyte disturbances, Altered mental status, Ongoing diagnostic testing needed not appropriate for outpatient work up and IV treatments appropriate due to intensity of illness   or inability to  take PO   Dispo: The patient is from: Home              Anticipated d/c is to: Unclear unclear              Anticipated d/c date is: 2 days              Patient currently is not medically stable to d/c.  Disposition is unclear-patient has advanced disease and has been recommended hospice Therapy recommends skilled versus 24-hour care hospital bed and wheelchair     Consultants:   Dr. Benay Spice oncology  Dr. Ardis Hughs gastroenterology  Dr. Domingo Cocking palliative  Procedures: ERCP 9/23 Impression:               - Extensive tissue ingrowth in the previously                            placed metal biliary stent.                           - Near complete blockage of the proixmal aspect of                            the metal bilairy stent, probably from tissue                            ingrowth.                           - Two of the suspected liver abscesses were evident                            on cholangiogram, with apparent direct                            communication with the right biliary system.                           - Significant purulence noted throughout the                            examination.                           The above was treated with biliary balloon sweeping                            and then eventual placement of a 9cm long 10Fr                            plastic biliary stent through the existing metal                            stent and into the right biliary system with the                            proximal aspect of the stent located  in one of the                            presumed liver abscesses and the distal end located                            in the duodenum. It is not clear if this will help                            resolve all of the infection in his liver.   CT scan abdomen pelvis 9/26 this patient go for CT of the abdomen pelvis today or not if not can you confirm that it is scheduled  Antimicrobials:  Meropenem  Subjective:  Somewhat tachycardic earlier in the day No other issues eating some Area of breakdown and sacrum noticed  Objective: Vitals:   12/30/19 2045 12/31/19 0609 12/31/19 0920 12/31/19 1044  BP: (!) 126/96 93/76 102/79 (!) 116/92  Pulse: 94 (!) 125 91 82  Resp: 18 18 14 14  Temp: 97.9 F (36.6 C) 98 F (36.7 C) 97.6 F (36.4 C) (!) 97.5 F (36.4 C)  TempSrc: Oral  Axillary Axillary  SpO2: 100% 97% 100% 100%  Weight:      Height:        Intake/Output Summary (Last 24 hours) at 12/31/2019 1505 Last data filed at 12/31/2019 1504 Gross per 24 hour  Intake 0 ml  Output 1075 ml  Net -1075 ml   Filed Weights   12/24/19 1440  Weight: 39.6 kg    Examination:  Awake coherent Looks somewhat stronger Slight tachycardia earlier today Chest clear no added sound no rales no rhonchi Buttocks exam     Data Reviewed: I have personally reviewed following labs and imaging studies  Sodium 139-->136 BUN/creatinine 26/0.3-9 g and 25/0.3-->21/0.3 Alk phos 424-->376-->408 AST/ALT 68/58-->53/56-->42/54-->37/53-->76/61 Bilirubin 5.7-->3.0-->2.5-->2.0-->3.3 White count 10.2-->8.0-->18.8 Hemoglobin 10.6---9.7-->10.1 Platelet 14-->43-->79-->91-->102-->51  Radiology Studies: DG Chest 2 View  Result Date: 12/30/2019 CLINICAL DATA:  Pancreatic cancer with right-sided pleural effusion. EXAM: CHEST - 2 VIEW COMPARISON:  December 27, 2019 FINDINGS: Injectable port in stable position. Cardiomediastinal silhouette is normal. Mediastinal contours appear intact. Stable right pleural effusion. Multiple nodular opacities, predominantly in the lung bases persist. Osseous structures are without acute abnormality. Soft tissues are grossly normal. IMPRESSION: Stable moderate right pleural effusion. Stable appearance of multiple nodular opacities throughout both lungs. Electronically Signed   By: Dobrinka  Dimitrova M.D.   On: 12/30/2019 10:56   DG CHEST PORT 1 VIEW  Result Date:  12/31/2019 CLINICAL DATA:  History of metastatic pancreatic cancer. History of cholangitis and hepatic abscesses. Pleural effusion. EXAM: PORTABLE CHEST 1 VIEW COMPARISON:  12/30/2019. FINDINGS: PowerPort catheter stable position. Heart size normal. Multiple nodular opacities are again noted throughout both lungs. Persistent bibasilar atelectasis and or infiltrates. Moderate right pleural effusion slightly increased from prior exam noted. No pneumothorax. Biliary stent right upper quadrant again noted. IMPRESSION: 1. Moderate right pleural effusion, slightly increased from prior exam. 2. Multiple nodular opacities again noted throughout both lungs. Persistent bibasilar atelectasis and or infiltrates. Electronically Signed   By: Thomas  Register   On: 12/31/2019 10:48     Scheduled Meds: . (feeding supplement) PROSource Plus  30 mL Oral TID BM  . sodium chloride   Intravenous Once  . Chlorhexidine Gluconate Cloth  6 each Topical Daily  . feeding   supplement  1 Container Oral TID BM  . indomethacin  100 mg Rectal Once  . iohexol      . morphine  60 mg Oral Q12H  . multivitamin with minerals  1 tablet Oral Daily  . nystatin  5 mL Oral QID  . sodium chloride flush  3 mL Intravenous Q12H   Continuous Infusions: . ciprofloxacin 400 mg (12/31/19 1500)  . dextrose 5 % and 0.9% NaCl 100 mL/hr at 12/31/19 1459  . metronidazole 500 mg (12/31/19 1344)     LOS: 8 days    Time spent: 35  Jai-Gurmukh Samtani, MD [facility] To contact the attending provider between 7A-7P or the covering provider during after hours 7P-7A, please log into the web site [email or web address] and access using universal [place] password for that web site. If you do not have the password, please call the hospital operator.  12/31/2019, 3:05 PM    

## 2019-12-31 NOTE — Progress Notes (Addendum)
HEMATOLOGY-ONCOLOGY PROGRESS NOTE  SUBJECTIVE: Resting quietly this morning.  Mother is at the bedside.  She states that he was restless overnight.  Pain overall well controlled.  No reports of shortness of breath or cough.  Oncology History  Malignant neoplasm of head of pancreas [facility])   Initial Diagnosis   Pancreatic adenocarcinoma ([place])   12/15/2018 Imaging   CT abdomen/pelvis w/ contrast: IMPRESSION: 1. Suggestion of a 1.5 cm mass within the mid aspect of the pancreatic body with associated atrophy of the upstream pancreatic tail and associated pancreatic ductal dilatation. Recommend further evaluation with MRI. There is soft tissue fullness around the superior mesenteric artery and celiac axis. This may represent edema or potentially tumor involvement. 2. Tree-in-bud nodularity within the peripheral aspect of the lower lobes bilaterally which may be secondary to an infectious or inflammatory process. Recommend follow-up chest CT in 3 months to assess for interval change. 3. These results will be called to the ordering clinician or representative by the Radiologist Assistant, and communication documented in the PACS or zVision Dashboard.   12/29/2018 Procedure   EUS: -2.4 x 1.9cm mass in the body of the pancreas causing the main pancreatic duct obstruction and clearly involving the celiac trunk. Biopsied. -Heterogenous soft tissue mass in the neck, measuring 3.6cm maximally. This was not sampled, presumed to be a large thyroid gland.  -No peripancreatic adenopathy -CBD was normal, non-dilated.   12/29/2018 Pathology Results   CASE: [id number]   DIAGNOSIS:  - Malignant cells consistent with adenocarcinoma  - See comment.   01/17/2019 Imaging   PET: IMPRESSION: 1. Suspected enlargement of the pancreatic mass, with abnormal hypermetabolic activity at the junction of the pancreatic body and tail currently measuring 3.4 by 2.9 by 2.4 cm. Although anatomic localization is  hampered by the patient's extreme paucity of adipose tissue (making separation of adjacent structures difficult on noncontrast CT), there is thought to be a high likelihood tumor extending around the vicinity of the celiac trunk and possibly the superior mesenteric artery. In this case, MRI might provide better anatomic localization with respect to perivascular involvement. 2. Potential mild left periaortic adenopathy, maximum SUV of the indistinct left periaortic lymph nodes is approximately 3.3 which is above blood pool levels. 3. Scattered ground-glass density nodules in the lungs favoring the upper lobes, with tree-in-bud nodularity in the lung bases favoring the lower lobes. None of these lesions are hypermetabolic. While possibilities include atypical infectious process drug reaction, surveillance of the chest is recommended to exclude low-grade adenocarcinoma. 4.  Aortic Atherosclerosis (ICD10-I70.0).   01/24/2019 -  Chemotherapy   The patient had PACLitaxel-protein bound (ABRAXANE) chemo infusion 150 mg, 100 mg/m2 = 150 mg (100 % of original dose 100 mg/m2), Intravenous,  Once, 6 of 8 cycles Dose modification: 100 mg/m2 (original dose 100 mg/m2, Cycle 1, Reason: Provider Judgment) Administration: 150 mg (01/24/2019), 150 mg (02/14/2019), 150 mg (03/02/2019), 150 mg (03/16/2019), 150 mg (04/26/2019), 150 mg (07/04/2019), 150 mg (07/18/2019), 150 mg (05/09/2019), 150 mg (05/23/2019), 150 mg (08/08/2019), 150 mg (08/22/2019), 150 mg (06/22/2019) gemcitabine (GEMZAR) 1,102 mg in sodium chloride 0.9 % 250 mL chemo infusion, 800 mg/m2 = 1,102 mg (100 % of original dose 800 mg/m2), Intravenous,  Once, 6 of 8 cycles Dose modification: 800 mg/m2 (original dose 800 mg/m2, Cycle 1, Reason: Provider Judgment), 1,000 mg/m2 (original dose 800 mg/m2, Cycle 5, Reason: Provider Judgment) Administration: 1,102 mg (01/24/2019), 1,102 mg (02/14/2019), 1,102 mg (03/02/2019), 1,102 mg (03/16/2019), 1,102 mg  (04/26/2019), 1,406 mg (07/04/2019), 1,406 mg (  07/18/2019), 1,406 mg (05/09/2019), 1,406 mg (05/23/2019), 1,406 mg (08/08/2019), 1,406 mg (08/22/2019), 1,406 mg (06/22/2019)  for chemotherapy treatment.    02/28/2019 Cancer Staging   Staging form: Exocrine Pancreas, AJCC 8th Edition - Clinical: Stage IB (cT2, cN0, cM0) - Signed by Tish Men, MD on 02/28/2019   04/16/2019 Imaging   CT CAP: IMPRESSION: 1. Margins of pancreatic neoplasm are difficult to identify. Similar appearance of soft tissue infiltration within the retroperitoneum with progressive narrowing of the portal venous confluence by tumor. No specific findings of solid organ metastasis or nodal metastasis within the abdomen or pelvis. 2. Unchanged appearance of multifocal sub solid nodules within the upper lung zones and bilateral lower lung zone tree-in-bud nodularity. As mentioned previously findings are nonspecific and may be inflammatory or infectious in etiology. Metastatic disease is not excluded. 3. Aortic atherosclerosis. 4. Right lobe of thyroid gland nodule. Consider further evaluation with thyroid ultrasound. If patient is clinically hyperthyroid, consider nuclear medicine thyroid uptake and scan.    PHYSICAL EXAMINATION:  Vitals:   12/31/19 0609 12/31/19 0920  BP: 93/76 102/79  Pulse: (!) 125 91  Resp: 18 14  Temp: 98 F (36.7 C) 97.6 F (36.4 C)  SpO2: 97% 100%   Filed Weights   12/24/19 1440  Weight: 39.6 kg    Intake/Output from previous day: 09/26 0701 - 09/27 0700 In: -  Out: 1100 [Urine:1100]  GENERAL: Alert, cachectic, no distress; minimal verbal interaction HEENT: Mild scleral icterus.  No thrush. SKIN: Small scattered ecchymoses. LUNGS: Right breath sounds diminished at the base.  No respiratory distress. HEART: regular rate & rhythm  ABDOMEN: Mild generalized tenderness. Vascular: Pitting edema at the lower legs bilaterally. NEURO: Follows commands  LABORATORY DATA:  I have reviewed the  data as listed CMP Latest Ref Rng & Units 12/31/2019 12/30/2019 12/29/2019  Glucose 70 - 99 mg/dL 89 79 77  BUN 6 - 20 mg/dL 21(H) 21(H) 25(H)  Creatinine 0.61 - 1.24 mg/dL 0.30(L) <0.30(L) 0.30(L)  Sodium 135 - 145 mmol/L 136 136 138  Potassium 3.5 - 5.1 mmol/L 3.7 3.9 3.9  Chloride 98 - 111 mmol/L 104 105 103  CO2 22 - 32 mmol/L 26 29 30   Calcium 8.9 - 10.3 mg/dL 7.5(L) 7.5(L) 7.8(L)  Total Protein 6.5 - 8.1 g/dL 5.0(L) 5.2(L) 5.6(L)  Total Bilirubin 0.3 - 1.2 mg/dL 3.3(H) 2.0(H) 2.5(H)  Alkaline Phos 38 - 126 U/L 408(H) 376(H) 424(H)  AST 15 - 41 U/L 76(H) 37 42(H)  ALT 0 - 44 U/L 61(H) 53(H) 54(H)    Lab Results  Component Value Date   WBC 18.8 (H) 12/31/2019   HGB 10.1 (L) 12/31/2019   HCT 30.2 (L) 12/31/2019   MCV 83.7 12/31/2019   PLT 51 (L) 12/31/2019   NEUTROABS 18.6 (H) 12/31/2019    DG Chest 2 View  Result Date: 12/30/2019 CLINICAL DATA:  Pancreatic cancer with right-sided pleural effusion. EXAM: CHEST - 2 VIEW COMPARISON:  December 27, 2019 FINDINGS: Injectable port in stable position. Cardiomediastinal silhouette is normal. Mediastinal contours appear intact. Stable right pleural effusion. Multiple nodular opacities, predominantly in the lung bases persist. Osseous structures are without acute abnormality. Soft tissues are grossly normal. IMPRESSION: Stable moderate right pleural effusion. Stable appearance of multiple nodular opacities throughout both lungs. Electronically Signed   By: Fidela Salisbury M.D.   On: 12/30/2019 10:56   CT Head Wo Contrast  Result Date: 12/06/2019 CLINICAL DATA:  Pancreatic cancer, chemotherapy, weakness, headache EXAM: CT HEAD WITHOUT CONTRAST TECHNIQUE: Contiguous axial  images were obtained from the base of the skull through the vertex without intravenous contrast. COMPARISON:  None. FINDINGS: Brain: No evidence of acute infarction, hemorrhage, hydrocephalus, extra-axial collection or mass lesion/mass effect. Vascular: No hyperdense  vessel or unexpected calcification. Skull: Normal. Negative for fracture or focal lesion. Sinuses/Orbits: No acute finding. Other: None. IMPRESSION: No acute intracranial pathology. No non-contrast CT evidence of intracranial metastatic disease or findings to explain headache. Contrast enhanced MRI may be used to more sensitively evaluate for intracranial metastatic disease if desired. Electronically Signed   By: Eddie Candle M.D.   On: 12/13/2019 11:30   CT Angio Chest PE W and/or Wo Contrast  Result Date: 12/12/2019 CLINICAL DATA:  Shortness of breath and chest pain. Suspect pulmonary embolism. History of pancreatic cancer on chemotherapy. EXAM: CT ANGIOGRAPHY CHEST WITH CONTRAST TECHNIQUE: Multidetector CT imaging of the chest was performed using the standard protocol during bolus administration of intravenous contrast. Multiplanar CT image reconstructions and MIPs were obtained to evaluate the vascular anatomy. CONTRAST:  154mL OMNIPAQUE IOHEXOL 350 MG/ML SOLN COMPARISON:  08/29/2019 FINDINGS: Cardiovascular: Right IJ central venous catheter is present with tip over the cavoatrial junction. Heart is normal size. Thoracic aorta is normal caliber. Pulmonary arterial system is well opacified and demonstrates a single small embolus over a right lower lobar subsegmental pulmonary artery. No left-sided pulmonary emboli. Mediastinum/Nodes: No evidence of mediastinal or hilar adenopathy. Remaining mediastinal structures are unremarkable. Lungs/Pleura: Lungs are adequately inflated demonstrate a moderate size right pleural effusion with associated atelectasis in the right base. There are numerous small bilateral pulmonary nodules with interval progression likely worsening metastatic disease. Airways are unremarkable. Upper Abdomen: Not well evaluated on this arterial phase CT scan. Suggestion of partial visualization of patient's known biliary stent. Moderate pneumobilia is present. Couple low-density foci over the  right lobe of the liver with air-fluid levels of uncertain clinical significance. Musculoskeletal: No focal abnormality. Review of the MIP images confirms the above findings. IMPRESSION: 1. Single small embolus over a right lower lobar subsegmental pulmonary artery. 2. Moderate size right pleural effusion with associated right basilar atelectasis. 3. Interval progression of multiple pulmonary nodules likely metastatic disease due to patient's known pancreatic cancer. 4. Couple indeterminate low-density foci over the right lobe of the liver with air-fluid levels of not well evaluated on this arterial phase contrast CT. Pneumobilia and partially visualized biliary stent. Recommend clinical correlation as further evaluation with venous phase contrast enhanced CT of the abdomen/pelvis would be helpful. Critical Value/emergent results were called by telephone at the time of interpretation on 12/06/2019 at 3:08 pm to provider [facility] RAY , who verbally acknowledged these results. Electronically Signed   By: Marin Olp M.D.   On: 12/18/2019 15:10   CT ABDOMEN PELVIS W CONTRAST  Result Date: 12/25/2019 CLINICAL DATA:  39 year old male with history of abdominal pain. Suspected abscess. EXAM: CT ABDOMEN AND PELVIS WITH CONTRAST TECHNIQUE: Multidetector CT imaging of the abdomen and pelvis was performed using the standard protocol following bolus administration of intravenous contrast. CONTRAST:  31mL OMNIPAQUE IOHEXOL 300 MG/ML  SOLN COMPARISON:  CT the abdomen and pelvis 08/29/2019. FINDINGS: Lower chest: Moderate right and small left pleural effusions lying dependently. Some pleural enhancement and thickening in the lower right hemithorax suggestive of malignant pleural involvement. Numerous pulmonary nodules scattered throughout the visualize lung bases, including areas of confluent nodularity in the lower lobes of the lungs bilaterally, most compatible with progressive metastatic disease. Hepatobiliary: Compared to  the prior study there are numerous low-attenuation  lesions within the liver, highly suspicious for multifocal intrahepatic abscesses (less likely to represent centrally necrotic metastatic lesions). The largest of these has internal septations and measures up to 3.6 x 3.5 cm in segment 5 (axial image 30 of series 2). One of these lesions in segment 7/8 of the liver has some non dependent gas, strongly favoring abscess. Extensive intrahepatic biliary ductal dilatation with a large volume of pneumobilia. Gallbladder is unremarkable in appearance. Interval placement of common bile duct stent which appears appropriately located. Pancreas: Poorly defined pancreatic mass in the superior aspect of the head of the pancreas (soft tissues in the superior aspect of the head of the pancreas are somewhat indistinct, potentially treatment related). Haziness in the peripancreatic fat, including soft tissue around portions of the celiac axis, common hepatic artery and proximal superior mesenteric artery, as well as the left renal vein, all of which appear narrowed, likely indicative of locally infiltrative tumor. This lesion is difficult to definitively identify and measure, but is estimated to measure approximately 4.4 x 3.0 cm (axial image 27 of series 2). Spleen: The appearance of the spleen is unremarkable. Adrenals/Urinary Tract: Bilateral kidneys and adrenal glands are normal in appearance. No hydroureteronephrosis. Urinary bladder is unremarkable in appearance. Stomach/Bowel: The appearance of the stomach is unremarkable. No pathologic dilatation of small bowel or colon. Portions of the colon appear thickened with some mucosal hyperenhancement (poorly evaluated given the adjacent ascites), best appreciated in the region of the cecum, ascending colon and hepatic flexure, suggestive of underlying colitis. Appendix is not confidently identified. Vascular/Lymphatic: Aortic atherosclerosis, without evidence of aneurysm or  dissection in the abdominal or pelvic vasculature. Vascular involvement from the pancreatic mass, discussed above. Poorly defined infiltrative soft tissue throughout the retroperitoneum adjacent to the pancreatic mass, with soft tissue completely encircling the upper abdominal aorta immediately below the level of the renal arteries (axial image 30 of series 2), presumably indicative of lymphatic spread of disease. Reproductive: Prostate gland and seminal vesicles are unremarkable in appearance. Other: Large volume of ascites. No pneumoperitoneum. Diffuse body wall edema. Musculoskeletal: There are no aggressive appearing lytic or blastic lesions noted in the visualized portions of the skeleton. IMPRESSION: 1. Today's study demonstrates what appears to be progression of disease with innumerable metastatic lesions throughout the visualize lung bases, probable malignant moderate right pleural effusion and small left pleural effusion, enlargement of the primary pancreatic mass now with vascular involvement, and what appears to be evolving retroperitoneal lymphadenopathy encasing the abdominal aorta. 2. In addition, there has been interval placement of a common bile duct stent, now with substantial intrahepatic biliary ductal dilatation, large amount of pneumatosis, and multiple new low-attenuation liver lesions, some of which have internal gas fluid levels, highly concerning for multiple intra-abdominal abscesses and probable cholangitis. 3. Profound mural thickening and mucosal hyperenhancement in portions of the colon (predominantly cecum, ascending colon and hepatic flexure), concerning for colitis. 4. Aortic atherosclerosis. 5. Additional incidental findings, as above. Electronically Signed   By: Vinnie Langton M.D.   On: 12/25/2019 14:50   DG Chest Port 1 View  Result Date: 12/12/2019 CLINICAL DATA:  39 year old male, status post thoracentesis EXAM: PORTABLE CHEST 1 VIEW COMPARISON:  Chest CT 12/11/2019,  chest x-ray 12/23/2018 FINDINGS: Cardiomediastinal silhouette unchanged in size and contour. Right IJ port catheter unchanged. Micro nodular pattern of opacity throughout the lungs, compatible with findings on the prior CT. Blunting of the right costophrenic angle persists, with no pneumothorax. No left-sided pleural fluid.  No new confluent airspace disease.  IMPRESSION: No complicating features status post right-sided thoracentesis, with persistent small fluid/atelectasis at the right lung base. Micro nodular pattern of opacity of the bilateral lungs compatible with findings on the recent prior CT chest. Unchanged right IJ port catheter. Electronically Signed   By: Corrie Mckusick D.O.   On: 12/16/2019 16:04   DG Chest Port 1 View  Result Date: 01/01/2020 CLINICAL DATA:  Pancreatic cancer, weakness EXAM: PORTABLE CHEST 1 VIEW COMPARISON:  08/29/2019 chest CT. FINDINGS: Right internal jugular Port-A-Cath terminates at the cavoatrial junction. Normal heart size. Normal mediastinal contour. No pneumothorax. New small right pleural effusion. No left pleural effusion. No pulmonary edema. Hazy patchy right lung base opacity. Indistinct tiny nodular opacities scattered in the lower lungs bilaterally. IMPRESSION: 1. New small right pleural effusion. 2. Hazy patchy right lung base opacity, favor atelectasis. 3. Indistinct tiny nodular opacities scattered in the lower lungs bilaterally, as seen on prior chest CT, cannot exclude metastatic disease. Electronically Signed   By: Ilona Sorrel M.D.   On: 12/28/2019 11:40   DG ERCP  Result Date: 12/15/2019 CLINICAL DATA:  Pancreas cancer, cholangitis, sepsis, stent occlusion EXAM: ERCP with common bile duct stent revision TECHNIQUE: Multiple spot images obtained with the fluoroscopic device and submitted for interpretation post-procedure. FLUOROSCOPY TIME:  Fluoroscopy Time:  10 minutes 37 seconds Radiation Exposure Index (if provided by the fluoroscopic device): Dense 110  mGy Number of Acquired Spot Images: Numerous spot fluoroscopic view COMPARISON:  12/25/2019 FINDINGS: Spot fluoroscopic views during ERCP confirm wire and catheter traversing the biliary metallic stent. Proximal contrast injection above the stent confirms biliary obstruction from stent occlusion. Endoscopic plastic biliary stent now traverses the previous metallic stent. 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 Successful plastic endoscopic stent placement traversing the proximal biliary tree through the CBD metallic stent. These images were submitted for radiologic interpretation only. Please see the procedural report for the amount of contrast and the fluoroscopy time utilized. Electronically Signed   By: Jerilynn Mages.  Shick M.D.   On: 12/16/2019 13:05   US THORACENTESIS ASP PLEURAL SPACE W/IMG GUIDE  Result Date: 12/18/2019 INDICATION: Patient with history of pancreatic cancer, failure to thrive, and right pleural effusion. Request made for diagnostic and therapeutic right thoracentesis EXAM: ULTRASOUND GUIDED DIAGNOSTIC AND THERAPEUTIC RIGHT THORACENTESIS MEDICATIONS: 8 mL 1% lidocaine COMPLICATIONS: None immediate. PROCEDURE: An ultrasound guided thoracentesis was thoroughly discussed with the patient and questions answered. The benefits, risks, alternatives and complications were also discussed. The patient understands and wishes to proceed with the procedure. Written consent was obtained. Ultrasound was performed to localize and mark an adequate pocket of fluid in the right chest. The area was then prepped and draped in the normal sterile fashion. 1% Lidocaine was used for local anesthesia. Under ultrasound guidance a 6 Fr Safe-T-Centesis catheter was introduced. Thoracentesis was performed. The catheter was removed and a dressing applied. FINDINGS: A total of approximately 850 mL of dark red fluid was removed. Samples were sent to the laboratory as requested by the clinical  team. IMPRESSION: Successful ultrasound guided right thoracentesis yielding 850 mL of pleural fluid. Read by: Earley Abide, PA-C Electronically Signed   By: Corrie Mckusick D.O.   On: 12/18/2019 16:00    ASSESSMENT AND PLAN: 1. Pancreas cancer  11/29/2018 CT abdomen/pelvis without contrast-appendix not well visualized, limited evaluation due to lack of enteric contrast and perineal fat  12/15/2018 CT abdomen/pelvis with contrast-possible 1.5 cm mass within the mid aspect of the pancreatic body with associated  atrophy of the upstream pancreatic tail and associated pancreatic ductal dilatation. Soft tissue fullness around the superior mesenteric artery and celiac axis. Tree-in-bud nodularity within the peripheral aspect of the lower lobes bilaterally.  12/28/2018 upper EUS-2.4 cm x 1.9 cm mass in the body of the pancreas causing main pancreatic duct obstruction and clearly involving the celiac trunk. Heterogeneous soft tissue mass in the neck measuring 3.6 cm. Fine-needle aspiration pancreas with malignant cells consistent with adenocarcinoma.  01/16/2019 Port-A-Cath placement  01/17/2019 PET scan-suspected enlargement of the pancreatic mass with abnormal hypermetabolic activity at the junction of the pancreatic body and tail measuring 3.4 x 2.9 x 2.4 cm, thought to be a high likelihood tumor extension around the vicinity of the celiac trunk and possibly the superior mesenteric artery. Potential mild left periaortic adenopathy maximum SUV of the indistinct left periaortic lymph nodes approximately 3.3 which is above blood pool levels. Scattered groundglass density nodules in the lungs favoring the upper lobes with tree-in-bud nodularity in the lung bases favoring the lower lobes. None of the lesions are hypermetabolic.  Gemcitabine/Abraxane 01/24/2019-08/22/2019  04/16/2019 CT chest/abdomen/pelvis-margins of pancreatic neoplasm difficult to identify. Similar appearance of soft tissue infiltration  within the retroperitoneum with progressive narrowing of the portal venous confluence by tumor. No specific findings of solid organ metastasis or nodal metastasis within the abdomen or pelvis. Unchanged appearance of multifocal subsolid nodules within the upper lung zones and bilateral lower lung zone tree-in-bud nodularity. Right lobe of thyroid gland nodule.  This  CT 08/29/2019-development of intra and extrahepatic biliary duct dilatation with abrupt cut off at the common bile duct, abnormal soft tissue in the retroperitoneum attenuates the portal vein and the splenic vein is occluded, fullness in the head of the pancreas appears more prominent without discrete margins, subtle 12 mm focus in the medial left liver   MRI liver 09/20/2019-heterogeneous hepatic hyperenhancement favored to represent perfusion anomalies in the setting of portal vein narrowing.  No convincing evidence of hepatic metastasis.  Locally advanced pancreatic carcinoma suboptimally evaluated.  Persistent mild intrahepatic biliary duct dilatation.  Subtle peripancreatic edema.  CT chest 12/09/2019-single small embolus over a right lower lobar subsegmental pulmonary artery.  Moderate sized right pleural effusion with associated right basilar atelectasis.  Interval progression of multiple pulmonary nodules likely metastatic disease.  Indeterminate low-density foci over the right lobe of the liver with air-fluid levels.  Pneumobilia and partially visualized biliary stent.  CT abdomen/pelvis 12/25/2019-moderate right and small left pleural effusions.  Some pleural enhancement and thickening in the lower right hemithorax suggestive of malignant pleural involvement.  Numerous pulmonary nodules scattered throughout the visualized lung bases most compatible with progressive metastatic disease. Interval placement of a common bile duct stent, now with substantial intrahepatic biliary ductal dilatation, large amount of pneumatosis and multiple new  low-attenuation liver lesions some of which have internal gas fluid levels highly concerning for multiple intra-abdominal abscesses and probable cholangitis.  Profound mural thickening and mucosal hyperenhancement in portions of the colon concerning for colitis. 2. Abdominal/back pain secondary to #1 3. Port-A-Cath placement 01/16/2019 4. PE on chest CT 01/03/2020.  Anticoagulation on hold due to severe thrombocytopenia. 5. Right pleural effusion on chest CT 12/12/2019-thoracentesis 12/28/2019 6. Admission with sepsis syndrome secondary to biliary obstruction secondary by #1-status post ERCP with placement of a new bile duct stent 12/26/2019  Mr. Leino appears unchanged.  WBC is increased this morning and platelets are trending downward again.  He remains afebrile and he has no active bleeding.  LFTs and T  bili rising this morning.  GI planning for repeat CT scan.  ERCP showed significant tissue ingrowth into the stent.  A new plastic biliary stent was placed.  He was found to have intrahepatic abscess and remains on IV antibiotics.    Status post thoracentesis on 12/20/2019 and awaiting cytology results.  Recommendations: 1.  We will follow-up on cytology from thoracentesis. The patient may be too deconditioned to receive any additional chemotherapy and may benefit from hospice if cytology shows metastatic disease.  He would be a candidate for hospice if cytology positive for metastatic disease. 2. Hold anticoagulation therapy and filter placement for now.  Can consider for Lovenox if platelets rise. 3.  Continue narcotic analgesics for pain 4.  Social work consult to discuss placement options with Mr. Pokorski and his family    LOS: 8 days   Mikey Bussing, AGPCNP-BC 12/31/19  Mr. Bugaj was interviewed and examined.  His mother was at the bedside.  He appears unchanged.  He is scheduled for a repeat CT to evaluate the liver abscesses.  I discussed the poor prognosis with Mr. Heffern  and his mother.  He appears to have metastatic pancreas cancer.  They understand no therapy will be curative.  He will not be a candidate for further systemic chemotherapy unless his performance status improves.  We will follow up on pathology from the 12/11/2019 pleural fluid.  If metastatic pancreas cancer is confirmed I will recommend hospice care.  CODE STATUS discussions can continue with the hospice team and myself as an outpatient.  He has a poor performance status.  He will most likely need skilled nursing facility placement at discharge.

## 2019-12-31 NOTE — Progress Notes (Addendum)
[facility] Progress Note  CC:  Metastatic pancreatic adenocarcinoma, cholangitis, hepatic abscesses   Subjective: He did not sleep well last night. His abdominal pain is currently controlled.No N/V. He is passing gas per the rectum. Mother reports he passed a small BM during the night.  Poor po in take reported by day shift RN.    Objective:  Vital signs in last 24 hours: Temp:  [97.1 F (36.2 C)-98 F (36.7 C)] 98 F (36.7 C) (09/27 0609) Pulse Rate:  [92-128] 125 (09/27 0609) Resp:  [17-19] 18 (09/27 0609) BP: (93-126)/(76-97) 93/76 (09/27 0609) SpO2:  [97 %-100 %] 97 % (09/27 0609) Last BM Date: 12/29/19   General:   Alert cachectic appearing male in NAD.  Eyes: Sclera non-icteric.  Heart: Tachycardic. No murmur.  Pulm: Breath sounds clear, diminished in the bases bilaterally.  Abdomen: Soft, nondistended. Nontender. Hypoactive bowel sounds x 4 quads. No HSM.  Extremities:  Without edema. Neurologic:  Alert and  oriented x4. Profound generalized weakness. Soft spoken. Answers questions appropriately.  Psych:  Alert and cooperative. Fatigued appearing.   Lab Results: Recent Labs    12/29/19 0409 12/30/19 0451 12/31/19 0237  WBC 7.7 8.0 18.8*  HGB 9.6* 9.7* 10.1*  HCT 28.8* 29.4* 30.2*  PLT 91* 102* 51*   BMET Recent Labs    12/29/19 0912 12/30/19 0451 12/31/19 0237  NA 138 136 136  K 3.9 3.9 3.7  CL 103 105 104  CO2 _0 GLUCOSE 77 79 89  BUN 25* 21* 21*  CREATININE 0.30* <0.30* 0.30*  CALCIUM 7.8* 7.5* 7.5*   LFT Recent Labs    12/29/19 0409 12/29/19 0912 12/31/19 0237  PROT 5.2*   < > 5.0*  ALBUMIN 2.0*   < > 1.8*  AST 39   < > 76*  ALT 51*   < > 61*  ALKPHOS 406*   < > 408*  BILITOT 2.5*   < > 3.3*  BILIDIR 0.7*  --   --   IBILI 1.8*  --   --    < > = values in this interval not displayed.   PT/INR No results for input(s): LABPROT, INR in the last 72 hours. Hepatitis Panel No results for input(s): HEPBSAG, HCVAB,  HEPAIGM, HEPBIGM in the last 72 hours.  DG Chest 2 View  Result Date: 12/30/2019 CLINICAL DATA:  Pancreatic cancer with right-sided pleural effusion. EXAM: CHEST - 2 VIEW COMPARISON:  December 27, 2019 FINDINGS: Injectable port in stable position. Cardiomediastinal silhouette is normal. Mediastinal contours appear intact. Stable right pleural effusion. Multiple nodular opacities, predominantly in the lung bases persist. Osseous structures are without acute abnormality. Soft tissues are grossly normal. IMPRESSION: Stable moderate right pleural effusion. Stable appearance of multiple nodular opacities throughout both lungs. Electronically Signed   By: Fidela Salisbury M.D.   On: 12/30/2019 10:56    Assessment / Plan:  26. 39 year old male with progressive metastatic pancreatic adenocarcinoma, cholangitis and hepatic abscesses (largest measuring 3.6 x 3.5cm). S/P ERCP 9/23 identified significant tumor ingrowth in the proximal uncovered CBD metal stent which was swept open with subsequent placement of a 9 cm x 10 French plastic stent originating in the hepatic abscess coursing through the right intrahepatic and terminating in the duodenum. WBC, T. Bili and  LFTS rising today concerning for progressive cholangitis. WBC 8.0 -> 18.8. T. Bili 2.0 -> 3.3. AST 37 -> 76. ALT 53 -> 61. Alk phos 376 -> 408.  Temp  64F. He is tachycardic with HR 125 b/m.  -Await repeat abd/pelvic CT results, radiology verified CTAP to be done today around 11:30am. May require hepatic abscess drain per IR. -Continue Cipro and Flagyl IV for now.  -? ID consult regarding tx for hepatic abscesses  -Pain management per the hospitalist  -IVF per the hospitalist   2. Thrombocytopenia. PLT 102 -> 51  3. Normocytic Anemia. Hg 10.1. Stable. No evidence of active GI bleeding.  4. Right sided pleural effusion s/p thoracentesis. Cytology pending.  5. Subsegmental PE, not on anticoagulation.  6. Severe malnutrition     LOS: 8 days     Noralyn Pick  12/31/2019, 8:36AM  ' ________________________________________________________________________  [facility] Heckler GI MD note:  I personally examined the patient, reviewed the data and agree with the assessment and plan described above.  He has increasing ascites, recurrent pleural effusions, untreated mesenteric clots, several abscesses in his liver that are not improving since ERCP last week.   I recommended hospice care. It would be nice if he can spend the rest of his days at home if that is possible.  WE asked IR to see in the meantime to consider aspirating, draining his liver abscesses however there are several and large volume ascites that obviously complicates percutaneous draining.   Owens Loffler, MD [facility] Pager [phone]

## 2019-12-31 NOTE — Progress Notes (Addendum)
Sacral dressing changed after much cueing. Noted new two new deep injury sites to left trochanter and coccyx area. Unable to measure areas as patient was unable to tolerate lying on his side. He has been refusing to be repositioned in bed. Provided pressure injury education to patient and mother who is at the bedside. Patient is cachectic.  Will asked for bed change to a low air loss mattress to prevent further skin injury.

## 2019-12-31 NOTE — Progress Notes (Signed)
OT Cancellation Note  Patient Details Name: Gabriel [name] MRN: [id number] DOB: [birth year]   Cancelled Treatment:    Reason Eval/Treat Not Completed: Other (comment)   Pt prepping for test. Will check on pt next day  Kari Baars, [place] [phone] Office- [phone], Edwena Felty D 12/31/2019, 2:13 PM

## 2020-01-01 LAB — COMPREHENSIVE METABOLIC PANEL
ALT: 83 U/L — ABNORMAL HIGH (ref 0–44)
AST: 156 U/L — ABNORMAL HIGH (ref 15–41)
Albumin: 1.7 g/dL — ABNORMAL LOW (ref 3.5–5.0)
Alkaline Phosphatase: 425 U/L — ABNORMAL HIGH (ref 38–126)
Anion gap: 4 — ABNORMAL LOW (ref 5–15)
BUN: 22 mg/dL — ABNORMAL HIGH (ref 6–20)
CO2: 26 mmol/L (ref 22–32)
Calcium: 7.4 mg/dL — ABNORMAL LOW (ref 8.9–10.3)
Chloride: 107 mmol/L (ref 98–111)
Creatinine, Ser: 0.34 mg/dL — ABNORMAL LOW (ref 0.61–1.24)
GFR calc Af Amer: >60 mL/min (ref >60)
GFR calc non Af Amer: >60 mL/min (ref >60)
Glucose, Bld: 83 mg/dL (ref 70–99)
Potassium: 3.9 mmol/L (ref 3.5–5.1)
Sodium: 137 mmol/L (ref 135–145)
Total Bilirubin: 2.4 mg/dL — ABNORMAL HIGH (ref 0.3–1.2)
Total Protein: 4.9 g/dL — ABNORMAL LOW (ref 6.5–8.1)

## 2020-01-01 LAB — CBC WITH DIFFERENTIAL/PLATELET
Abs Immature Granulocytes: 0.11 10*3/uL — ABNORMAL HIGH (ref 0.00–0.07)
Basophils Absolute: 0 10*3/uL (ref 0.0–0.1)
Basophils Relative: 0 %
Eosinophils Absolute: 0 10*3/uL (ref 0.0–0.5)
Eosinophils Relative: 0 %
HCT: 29.5 % — ABNORMAL LOW (ref 39.0–52.0)
Hemoglobin: 9.9 g/dL — ABNORMAL LOW (ref 13.0–17.0)
Immature Granulocytes: 1 %
Lymphocytes Relative: 4 %
Lymphs Abs: 0.6 10*3/uL — ABNORMAL LOW (ref 0.7–4.0)
MCH: 27.7 pg (ref 26.0–34.0)
MCHC: 33.6 g/dL (ref 30.0–36.0)
MCV: 82.4 fL (ref 80.0–100.0)
Monocytes Absolute: 0.3 10*3/uL (ref 0.1–1.0)
Monocytes Relative: 2 %
Neutro Abs: 15.4 10*3/uL — ABNORMAL HIGH (ref 1.7–7.7)
Neutrophils Relative %: 93 %
Platelets: 11 10*3/uL — CL (ref 150–400)
RBC: 3.58 MIL/uL — ABNORMAL LOW (ref 4.22–5.81)
RDW: 14.4 % (ref 11.5–15.5)
WBC: 16.4 10*3/uL — ABNORMAL HIGH (ref 4.0–10.5)
nRBC: 0 % (ref 0.0–0.2)

## 2020-01-01 LAB — TYPE AND SCREEN
ABO/RH(D): O POS
Antibody Screen: NEGATIVE

## 2020-01-01 LAB — MAGNESIUM: Magnesium: 1.7 mg/dL (ref 1.7–2.4)

## 2020-01-01 MED ORDER — BOOST / RESOURCE BREEZE PO LIQD CUSTOM
1.0000 | Freq: Two times a day (BID) | ORAL | Status: DC
  Administered 2020-01-01 – 2020-01-03 (×4): 1 via ORAL

## 2020-01-01 MED ORDER — SODIUM CHLORIDE 0.9% IV SOLUTION: Freq: Once | INTRAVENOUS | Status: DC

## 2020-01-01 MED ORDER — ACETAMINOPHEN 325 MG PO TABS: 650.0000 mg | ORAL_TABLET | Freq: Once | ORAL | Status: DC

## 2020-01-01 MED ORDER — FUROSEMIDE 10 MG/ML IJ SOLN: 20.0000 mg | Freq: Once | INTRAMUSCULAR | Status: DC

## 2020-01-01 MED ORDER — KATE FARMS STANDARD 1.4 PO LIQD
325.0000 mL | Freq: Every day | ORAL | Status: DC
  Administered 2020-01-02: 325 mL via ORAL
  Filled 2020-01-01 (×4): qty 325

## 2020-01-01 NOTE — Progress Notes (Signed)
Nutrition Follow-up  DOCUMENTATION CODES:   Underweight, Severe malnutrition in context of chronic illness  INTERVENTION:  - will d/c Prosource Plus. - will decrease Boost Breeze from TID to BID, each supplement provides 250 kcal and 9 grams of protein. - will order Dillard [place] 1.4 po once/day, each supplement provides 455 kcal and 20 grams protein.  - will continue to monitor GOC/POC.  * weigh patient today as he has not been weighed since 9/20.  NUTRITION DIAGNOSIS:   Severe Malnutrition related to chronic illness, cancer and cancer related treatments as evidenced by severe fat depletion, severe muscle depletion. -revised  GOAL:   Patient will meet greater than or equal to 90% of their needs -unmet  MONITOR:   PO intake, Supplement acceptance, Labs, Weight trends  ASSESSMENT:   39 year old male with medical history of metastatic pancreatic cancer on palliative chemotherapy and scheduled for radiation therapy. He presented to the ED with decreased appetite, generalized weakness leading to difficulty ambulating, and dyspnea x3 days. In the ED, patient was unable to provide a history today due to his altered mental status.  Diet at time of RD assessment on 9/21 was CLD. Diet was advanced to FLD on 9/22 and then to Regular on 9/24. He has been eating 0-50% since 9/22. He has accepted Boost Breeze 25-50% of the time offered and Prosource Plus 25% of the time offered.   Patient currently laying in bed with mom and wife at bedside. Introduced self and informed patient and family that RD is here to support them in any way that they need or to answer any nutrition-related questions that they may have. Patient and family deny any nutrition-related needs, questions, or concerns at this time. Encouraged them to let RN or MD know if anything arises and RD is happy to stop back to assist.   He remains Full Code at this time. Palliative Care is following and last met with him earlier this AM.  Recommendation by Palliative Care is for home with hospice and full focus on comfort measures and aggressive symptom management. Guarded prognosis noted.    Labs reviewed; BUN: 22 mg/dl, creatinine: 0.34 mg/dl, Ca: 7.4 mg/dl, Alk Phos elevated, LFTs elevated.  Medications reviewed; 20 mg IV lasix x1 dose 9/28, 5 ml mycostatin QID.  IVF; D5-NS @ 100 ml/hr.      NUTRITION - FOCUSED PHYSICAL EXAM:    Most Recent Value  Orbital Region Moderate depletion  Upper Arm Region Severe depletion  Thoracic and Lumbar Region Severe depletion  Buccal Region Severe depletion  Temple Region Severe depletion  Clavicle Bone Region Severe depletion  Clavicle and Acromion Bone Region Severe depletion  Scapular Bone Region Unable to assess  Dorsal Hand Moderate depletion  Patellar Region Unable to assess  Anterior Thigh Region Unable to assess  Posterior Calf Region Unable to assess  Edema (RD Assessment) Unable to assess  Hair Reviewed  Eyes Reviewed  Mouth Reviewed  Skin Reviewed  Nails Reviewed       Diet Order:   Diet Order            Diet regular Room service appropriate? Yes; Fluid consistency: Thin  Diet effective now                 EDUCATION NEEDS:   No education needs have been identified at this time  Skin:  Skin Assessment: Skin Integrity Issues: Skin Integrity Issues:: Stage II Stage II: sacrum  Last BM:  9/25  Height:   Ht  Readings from Last 1 Encounters:  12/24/19 _0  (1.727 m)    Weight:   Wt Readings from Last 1 Encounters:  12/24/19 39.6 kg    Ideal Body Weight:  70 kg  Estimated Nutritional Needs:   Kcal:  2100-2310 kcal (30-33 kcal/kg IBW)  Protein:  105-119 grams (1.5-1.7 grams/kg IBW)  Fluid:  >/= 2.3 L/day     Jarome Matin, MS, RD, LDN, CNSC Inpatient Clinical Dietitian RD pager # available in [facility]  After hours/weekend pager # available in [facility]

## 2020-01-01 NOTE — Progress Notes (Signed)
PROGRESS NOTE    Gabriel [name]  POE:[id number] DOB: [birth year] DOA: 12/11/2019 PCP: Kerin Perna, NP  Brief Narrative:  39 year old black male Diagnosed with stage Ib T2N0MX pancreatic cancer 910 2021.5 cm mass-EUS 9/24 2.5 cm mass biopsy = adenocarcinoma-not a candidate for surgery due to SMA involvement-underwent palliative gemcitabine and Abraxane- Previous ERCP with Dr. Ardis Hughs tight mid biliary structure 6 cm / 10 cm uncovered biliary stent was placed in June 2021 he has been scheduled for radiation therapy presented with decreased appetite generalized weakness and was altered and confused on admission 12/06/2019 Notable labs BUNs/creatinine 51/0.5 AST/ALT 62/61 alk phos 471 WBC 38 Platelets were 11,000 on admission CT chest small subsegmental PE moderate right-sided effusion CT abdomen pelvis multiple no attenuating lesions?  Hepatic abscesses 3.6 X3.5 Sepsis from biliary versus urinary source entertained GI consulted ERCP performed 9/23 in addition to thoracentesis on the same day as below  Still await CT abd pelvis as well as cytology from thoracentesis from 9/23  labs have worsened again 9/27 indicating further hepatic abscess which is not amenable to drain as below  Palliative was reconsulted patient and family are contemplating next steps  Assessment & Plan:   Principal Problem:   Sepsis secondary to UTI [facility]) Active Problems:   Malignant neoplasm of head of pancreas ([place])   Cancer-related pain   Failure to thrive in adult   Pulmonary embolism (HCC)   Pleural effusion   Pressure injury of skin   Thrombocytopenia ([place])   1. Sepsis on admission with hepatic abscesses +cholangitis?  Growing Klebsiella in urine-blood cultures not done on admission a. Transition 9/24 from meropenem to Cipro and Flagyl-stop date 01/06/2020 for 14 days at least-this will probably not change poor overall outcome b. White count has risen LFTs have risen platelets have dropped  indicating DIC versus abscesses c. Already had extensive ERCP and instrumentation with plastic stent placement 9/23 Dr. Ardis Hughs with significant purulence-long discussion with Dr. Laurence Ferrari and conferenced with Dr. Ardis Hughs with regards to next steps-not a good candidate for [facility] drain placement--- drain placement has been counseled d. He has significant ascites and would need a minimum of 3-4 drains placed which would probably become soggy with the ascitic fluid and make it difficult for this to ever heal e. Very poor prognosis 2. Severe thrombocytopenia probably secondary to combination of cancer and DIC a. Platelets have dropped precipitously indicating worsening function b. Transfuse if bleeding and under 10-transfusion was counseled today by oncology 3. Probable metastatic pancreatic cancer on palliative gemcitabine Abraxane SBRT a. No further plans for chemo-Dr. Malachy Mood has seen the patient extensively discussed with the family and has recommended hospice b. CT abd pelv rpt for 9/26 pending c. Family including patient and wife spent time 9/28 discussing options 4. SVT overnight 9/26 a. Long run of SVT overnight to go with tachycardia b. Strict control limited by hypotension but start Toprol-XL 12.5-had to be held because of mild bradycardia in the 60s 5. Small subsegmental pulmonary embolism a. Not really a candidate for anticoagulation at this time--some evidence points to also not anticoagulating small subsegmental emboli b. Oncology recommends holding off on IVC filter placement no anticoagulation currently c. Monitor and discuss as an outpatient 6. Moderate right pleural effusion a. See above discussion-thoracentesis performed 9/23 b. Rpt CXR non emergently 7. Sacral decubiti a. Secondary to poor mobility b. Mattress work-up ordered c. Will need hospital bed and turning quite frequently 8. Severe thrombocytopenia a. Platelet counts better 2/2 resolution of severe sepsis with  probable  DIC b. See above  DVT prophylaxis: SCD Code Status: Full code at this time Family Communication: Discussed with mother and wife in person from 42 to 2:50 PM I went over the philosophy of hospice in addition to enlisted from them understanding of disease progression in detail The mother seems to have an understanding that he will be cured miraculously-and while I am respecting of that we also need to plan with regards to the same-I let the family know that the disease is progressing relatively rapidly and that without options for surgical treatment in an immunocompromised and very debilitated.-His prognosis is very poor and short I think his wife and him are thinking through this slowly and deliberately but I have asked him to think through it and make some decisions about hospice by tomorrow-she did mention at the end of our discussion that she would want him to be at home with her for his last days if this is indeed the case We broached CODE STATUS discussions on 9/28 and I detailed what resuscitation effort would look like in terms of chest compressions as well as intubation and patient himself is undecided as he defers to his family-we elected to let them think through this without a firm decision today but plans for further decision-making in the morning in terms of goals CODE STATUS I will text and copy forward this to the care team including oncology and palliative care to continue these discussions tomorrow I have let the family know I am not here but hopefully between oncology and palliative care we can solidify goals of care Disposition:   Status is: Inpatient  Remains inpatient appropriate because:Persistent severe electrolyte disturbances, Altered mental status, Ongoing diagnostic testing needed not appropriate for outpatient work up and IV treatments appropriate due to intensity of illness or inability to take PO   Dispo: The patient is from: Home              Anticipated d/c is to:  Unclear unclear              Anticipated d/c date is: 2 days              Patient currently is not medically stable to d/c.  Disposition is unclear-patient has advanced disease and has been recommended hospice Therapy recommends skilled versus 24-hour care hospital bed and wheelchair     Consultants:   Dr. Benay Spice oncology  Dr. Ardis Hughs gastroenterology  Dr. Domingo Cocking palliative  Procedures: ERCP 9/23 Impression:               - Extensive tissue ingrowth in the previously                            placed metal biliary stent.                           - Near complete blockage of the proixmal aspect of                            the metal bilairy stent, probably from tissue                            ingrowth.                           -  Two of the suspected liver abscesses were evident                            on cholangiogram, with apparent direct                            communication with the right biliary system.                           - Significant purulence noted throughout the                            examination.                           The above was treated with biliary balloon sweeping                            and then eventual placement of a 9cm long 10Fr                            plastic biliary stent through the existing metal                            stent and into the right biliary system with the                            proximal aspect of the stent located in one of the                            presumed liver abscesses and the distal end located                            in the duodenum. It is not clear if this will help                            resolve all of the infection in his liver.   CT scan abdomen pelvis 9/26 this patient go for CT of the abdomen pelvis today or not if not can you confirm that it is scheduled  Antimicrobials: Meropenem  Subjective:  Had a good day today apparently sat up on the side of the bed according to  mother no chest pain one stool no fever pain seems relatively controlled Both wife and mother are in room   Objective: Vitals:   12/31/19 2219 01/01/20 0541 01/01/20 1044 01/01/20 1255  BP: (!) 117/96 (!) 112/92  (!) 116/95  Pulse: 73 72 62 65  Resp:  18  16  Temp: 97.8 F (36.6 C) 98.2 F (36.8 C)  (!) 97.3 F (36.3 C)  TempSrc:      SpO2: 100% 100%  100%  Weight:      Height:        Intake/Output Summary (Last 24 hours) at 01/01/2020 1449 Last data filed at 01/01/2020 0328 Gross per 24 hour  Intake 1506.37 ml  Output 475 ml  Net 1031.37 ml   [employer]  12/24/19 1440  Weight: 39.6 kg    Examination:  Awake coherent No distress alert S1-S2 no murmur Abdomen distended with some ascites no rebound no guarding  Data Reviewed: I have personally reviewed following labs and imaging studies  Sodium 139-->137 BUN/creatinine 26/0.3-9 g and 25/0.3--> 22/0.3 Alk phos 424-->376-->408-->425 AST/ALT 68/58-->53/56-->42/54-->37/53-->76/61-->156/83 Bilirubin 5.7-->3.0-->2.5-->2.0-->3.3-->2.4 White count 10.2-->8.0-->18.8-->16.4 Hemoglobin 10.6---9.7--> 9.9 Platelet 14-->43-->79-->91-->102-->51-->11  Radiology Studies: CT ABDOMEN PELVIS W CONTRAST  Result Date: 12/31/2019 CLINICAL DATA:  Adnexal past.  History of pancreatic cancer. EXAM: CT ABDOMEN AND PELVIS WITH CONTRAST TECHNIQUE: Multidetector CT imaging of the abdomen and pelvis was performed using the standard protocol following bolus administration of intravenous contrast. CONTRAST:  139m OMNIPAQUE IOHEXOL 300 MG/ML  SOLN COMPARISON:  December 25, 2019. FINDINGS: Lower chest: Moderate bilateral pleural effusions are noted with multiple nodule seen involving the visualized lung bases consistent with metastatic disease. Hepatobiliary: Continued presence of stent within common bile duct. There has been placement of a nother stent within the common bile duct that extends from cavitary abnormality in upper pole of right  kidney and through common bile duct into the duodenum. No gallstones are noted. Extensive pneumobilia is again noted. There remains multiple rounded low densities some of which with air-fluid levels, concerning for hepatic abscesses. The largest measures 4.3 cm inferiorly in the right hepatic lobe. Pancreas: There is again noted pancreatic head mass which is poorly defined. It appears to surround portions of the celiac and superior mesenteric arteries. It is difficult to determine the size given its ill-defined and infiltrative nature. Spleen: Normal in size without focal abnormality. Adrenals/Urinary Tract: Adrenal glands and kidneys appear normal. No hydronephrosis or renal obstruction is noted. Moderate urinary bladder distention is noted. Stomach/Bowel: Stomach is within normal limits. Appendix appears normal. No evidence of bowel wall thickening, distention, or inflammatory changes. Vascular/Lymphatic: Atherosclerosis of abdominal aorta is noted without aneurysm or dissection. As noted above, there appears to be extension of the pancreatic mass around the vasculature of the proximal abdominal aorta and celiac and superior mesenteric branches. No discrete adenopathy is noted. 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 Reproductive: Prostate is unremarkable. Other: Significantly increased anasarca is noted. Mildly increased ascites is noted as well. Musculoskeletal: No acute or significant osseous findings. IMPRESSION: 1. 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 Significantly increased ascites and anasarca is noted compared to prior exam. These results will be called to the ordering clinician or representative by the Radiologist Assistant, and communication documented in the PACS or zVision Dashboard. 2. Moderate bilateral pleural effusions are noted with multiple nodules seen involving the visualized lung bases  consistent with metastatic disease. 3. Continued presence of stent within common bile duct. There has been placement of another stent within the common bile duct that extends from cavitary abnormality in upper pole of right kidney and through common bile duct into the duodenum. Extensive pneumobilia is again noted. 4. Multiple rounded low densities are noted some of which with air-fluid levels, concerning for hepatic abscesses. The largest measures 4.3 cm inferiorly in the right hepatic lobe. 5. There is again noted pancreatic head mass which is poorly defined. It is difficult to determine the size given its ill-defined and infiltrative nature. It appears to surround portions of the celiac and superior mesenteric arteries. 6. Moderate urinary bladder distention is noted. 7. Aortic atherosclerosis. Aortic Atherosclerosis (ICD10-I70.0). Electronically Signed   By: JMarijo Conception  M.D.   On: 12/31/2019 15:58   DG CHEST PORT 1 VIEW  Result Date: 12/31/2019 CLINICAL DATA:  History of metastatic pancreatic cancer. History of cholangitis and hepatic abscesses. Pleural effusion. EXAM: PORTABLE CHEST 1 VIEW COMPARISON:  12/30/2019. FINDINGS: PowerPort catheter stable position. Heart size normal. Multiple nodular opacities are again noted throughout both lungs. Persistent bibasilar atelectasis and or infiltrates. Moderate right pleural effusion slightly increased from prior exam noted. No pneumothorax. Biliary stent right upper quadrant again noted. IMPRESSION: 1. Moderate right pleural effusion, slightly increased from prior exam. 2. Multiple nodular opacities again noted throughout both lungs. Persistent bibasilar atelectasis and or infiltrates. Electronically Signed   By: Marcello Moores  Register   On: 12/31/2019 10:48     Scheduled Meds: . sodium chloride   Intravenous Once  . sodium chloride   Intravenous Once  . acetaminophen  650 mg Oral Once  . Chlorhexidine Gluconate Cloth  6 each Topical Daily  . feeding  supplement  1 Container Oral BID BM  . feeding supplement ([employer] STANDARD 1.4)  325 mL Oral Daily  . furosemide  20 mg Intravenous Once  . indomethacin  100 mg Rectal Once  . metoprolol succinate  12.5 mg Oral Daily  . morphine  60 mg Oral Q12H  . multivitamin with minerals  1 tablet Oral Daily  . nystatin  5 mL Oral QID  . sodium chloride flush  3 mL Intravenous Q12H   Continuous Infusions: . ciprofloxacin Stopped (01/01/20 0328)  . dextrose 5 % and 0.9% NaCl 100 mL/hr at 01/01/20 0130  . metronidazole 500 mg (01/01/20 1248)     LOS: 9 days    Time spent: [place], MD [facility] To contact the attending provider between 7A-7P or the covering provider during after hours 7P-7A, please log into the web site [email or web address] and access using universal [place] password for that web site. If you do not have the password, please call the hospital operator.  01/01/2020, 2:49 PM

## 2020-01-01 NOTE — Progress Notes (Signed)
CRITICAL VALUE STICKER  CRITICAL VALUE: Platelet 11  DATE & TIME NOTIFIED: 01/01/20 0453  MD NOTIFIED: Jerilynn Mages. Denny   RESPONSE:  No response no new orders

## 2020-01-01 NOTE — Progress Notes (Addendum)
Daily Progress Note   Patient Name: Gabriel [name]       Date: 01/01/2020 DOB: [birth year]  Age: 39 y.o. MRN#: [id number] Attending Physician: Nita Sells, MD Primary Care Physician: Kerin Perna, NP Admit Date: 12/19/2019  Reason for Consultation/Follow-up: Establishing goals of care  Subjective:  awake alert, mother at bedside, no distress.   Length of Stay: 9  Current Medications: Scheduled Meds:  . (feeding supplement) PROSource Plus  30 mL Oral TID BM  . sodium chloride   Intravenous Once  . sodium chloride   Intravenous Once  . acetaminophen  650 mg Oral Once  . Chlorhexidine Gluconate Cloth  6 each Topical Daily  . feeding supplement  1 Container Oral TID BM  . furosemide  20 mg Intravenous Once  . indomethacin  100 mg Rectal Once  . metoprolol succinate  12.5 mg Oral Daily  . morphine  60 mg Oral Q12H  . multivitamin with minerals  1 tablet Oral Daily  . nystatin  5 mL Oral QID  . sodium chloride flush  3 mL Intravenous Q12H    Continuous Infusions: . ciprofloxacin Stopped (01/01/20 0328)  . dextrose 5 % and 0.9% NaCl 100 mL/hr at 01/01/20 0130  . metronidazole 500 mg (01/01/20 0328)    PRN Meds: morphine, morphine injection, sodium chloride flush  Physical Exam         Awake alert No distress Appears frail weak Has muscle wasting No edema Regular   Vital Signs: BP (!) 112/92 (BP Location: Left Arm)   Pulse 72   Temp 98.2 F (36.8 C)   Resp 18   Ht 5\' 8"  (1.727 m)   Wt 39.6 kg   SpO2 100%   BMI 13.27 kg/m  SpO2: SpO2: 100 % O2 Device: O2 Device: Room Air O2 Flow Rate: O2 Flow Rate (L/min): 8 L/min  Intake/output summary:   Intake/Output Summary (Last 24 hours) at 01/01/2020 1011 Last data filed at 01/01/2020 0328 Gross per 24  hour  Intake 2079.36 ml  Output 475 ml  Net 1604.36 ml   LBM: Last BM Date: 12/29/19 Baseline Weight: Weight: 39.6 kg Most recent weight: Weight: 39.6 kg       Palliative Assessment/Data:    Flowsheet Rows     Most Recent Value  Intake Tab  Referral Department Hospitalist  Unit at Time of Referral Med/Surg Unit  Palliative Care Primary Diagnosis Cancer  Date Notified 12/07/2019  Palliative Care Type New Palliative care  Reason for referral Clarify Goals of Care  Date of Admission 12/15/2019  Date first seen by Palliative Care 12/25/19  # of days Palliative referral response time 2 Day(s)  # of days IP prior to Palliative referral 0  Clinical Assessment  Palliative Performance Scale Score 50%  Psychosocial & Spiritual Assessment  Palliative Care Outcomes  Patient/Family meeting held? Yes  Who was at the meeting? Patient, fiancee, mother  Palliative Care Outcomes Clarified goals of care      Patient Active Problem List   Diagnosis Date Noted  . Thrombocytopenia ([place])   . Pressure injury of skin 12/26/2019  . Failure to thrive in adult 01/02/2020  . Sepsis secondary to UTI ([place]) 12/21/2019  . Pulmonary embolism ([place]) 12/13/2019  . Pleural effusion 01/02/2020  . Dilated bile duct   . Pancreatic mass   . Anemia due to antineoplastic chemotherapy 02/14/2019  . Family history of GI malignancy 02/14/2019  . Protein malnutrition ([place]) 01/24/2019  . Family history of breast cancer   . Family history of prostate cancer   . Cancer-related pain 01/10/2019  . Goals of care, counseling/discussion 01/10/2019  . Malignant neoplasm of head of pancreas ([place])   . Weight loss     Palliative Care Assessment & Plan   Patient Profile:  Gabriel [name] is a 39 year old male with PMHx metastatic pancreatic cancer on palliative chemotherapy with plan for radiation who was admitted with decreased appetite and weakness.  CT revealed subsegmental PE, moderate right effusion, liver abscesses,  and metastatic disease in lower lungs.  Palliative consulted for goals of care.    Assessment:  generalized weakness.  Generalized pain.  Functional decline.   Recommendations/Plan:   discussed with patient and his mother about current pain medication he is on scheduled MS Contin 60 mg PO Q 12 hours, additionally, he is also on MSIR 15 mg PO Q 4 hours PRN. Also has rescue IV Morphine PRN available. Has not been using too many opioids.   Discussed with patient and mother about [facility] consult and further information about hospice. They wish to not discuss with me today, they are awaiting arrival of wife to the hospital to finally discuss with med onc.   Recommend home with hospice and full focus on comfort measures and aggressive symptom management. No additional PMT specific recommendation at this time.    Code Status:    Code Status Orders  (From admission, onward)         Start     Ordered   12/14/2019 1729  Full code  Continuous        12/26/2019 1728        Code Status History    This patient has a current code status but no historical code status.   Advance Care Planning Activity       Prognosis:   guarded  Discharge Planning:  To Be Determined  Care plan was discussed with  Patient, mother.   Thank you for allowing the Palliative Medicine Team to assist in the care of this patient.   Time In: 9.30 Time Out: 9.55 Total Time 25 Prolonged Time Billed  no       Greater than 50%  of this time was spent counseling and coordinating care related to the above assessment and plan.  Loistine Chance, MD  Please contact Palliative  Medicine Team phone at [phone] for questions and concerns.

## 2020-01-01 NOTE — Progress Notes (Addendum)
HEMATOLOGY-ONCOLOGY PROGRESS NOTE  SUBJECTIVE: Resting quietly this morning.  Mother is at the bedside.  Breathing is stable.  Pain currently controlled.  Oncology History  Malignant neoplasm of head of pancreas [facility])   Initial Diagnosis   Pancreatic adenocarcinoma ([place])   12/15/2018 Imaging   CT abdomen/pelvis w/ contrast: IMPRESSION: 1. Suggestion of a 1.5 cm mass within the mid aspect of the pancreatic body with associated atrophy of the upstream pancreatic tail and associated pancreatic ductal dilatation. Recommend further evaluation with MRI. There is soft tissue fullness around the superior mesenteric artery and celiac axis. This may represent edema or potentially tumor involvement. 2. Tree-in-bud nodularity within the peripheral aspect of the lower lobes bilaterally which may be secondary to an infectious or inflammatory process. Recommend follow-up chest CT in 3 months to assess for interval change. 3. These results will be called to the ordering clinician or representative by the Radiologist Assistant, and communication documented in the PACS or zVision Dashboard.   12/29/2018 Procedure   EUS: -2.4 x 1.9cm mass in the body of the pancreas causing the main pancreatic duct obstruction and clearly involving the celiac trunk. Biopsied. -Heterogenous soft tissue mass in the neck, measuring 3.6cm maximally. This was not sampled, presumed to be a large thyroid gland.  -No peripancreatic adenopathy -CBD was normal, non-dilated.   12/29/2018 Pathology Results   CASE: [id number]   DIAGNOSIS:  - Malignant cells consistent with adenocarcinoma  - See comment.   01/17/2019 Imaging   PET: IMPRESSION: 1. Suspected enlargement of the pancreatic mass, with abnormal hypermetabolic activity at the junction of the pancreatic body and tail currently measuring 3.4 by 2.9 by 2.4 cm. Although anatomic localization is hampered by the patient's extreme paucity of adipose tissue (making  separation of adjacent structures difficult on noncontrast CT), there is thought to be a high likelihood tumor extending around the vicinity of the celiac trunk and possibly the superior mesenteric artery. In this case, MRI might provide better anatomic localization with respect to perivascular involvement. 2. Potential mild left periaortic adenopathy, maximum SUV of the indistinct left periaortic lymph nodes is approximately 3.3 which is above blood pool levels. 3. Scattered ground-glass density nodules in the lungs favoring the upper lobes, with tree-in-bud nodularity in the lung bases favoring the lower lobes. None of these lesions are hypermetabolic. While possibilities include atypical infectious process drug reaction, surveillance of the chest is recommended to exclude low-grade adenocarcinoma. 4.  Aortic Atherosclerosis (ICD10-I70.0).   01/24/2019 -  Chemotherapy   The patient had PACLitaxel-protein bound (ABRAXANE) chemo infusion 150 mg, 100 mg/m2 = 150 mg (100 % of original dose 100 mg/m2), Intravenous,  Once, 6 of 8 cycles Dose modification: 100 mg/m2 (original dose 100 mg/m2, Cycle 1, Reason: Provider Judgment) Administration: 150 mg (01/24/2019), 150 mg (02/14/2019), 150 mg (03/02/2019), 150 mg (03/16/2019), 150 mg (04/26/2019), 150 mg (07/04/2019), 150 mg (07/18/2019), 150 mg (05/09/2019), 150 mg (05/23/2019), 150 mg (08/08/2019), 150 mg (08/22/2019), 150 mg (06/22/2019) gemcitabine (GEMZAR) 1,102 mg in sodium chloride 0.9 % 250 mL chemo infusion, 800 mg/m2 = 1,102 mg (100 % of original dose 800 mg/m2), Intravenous,  Once, 6 of 8 cycles Dose modification: 800 mg/m2 (original dose 800 mg/m2, Cycle 1, Reason: Provider Judgment), 1,000 mg/m2 (original dose 800 mg/m2, Cycle 5, Reason: Provider Judgment) Administration: 1,102 mg (01/24/2019), 1,102 mg (02/14/2019), 1,102 mg (03/02/2019), 1,102 mg (03/16/2019), 1,102 mg (04/26/2019), 1,406 mg (07/04/2019), 1,406 mg (07/18/2019), 1,406 mg (05/09/2019),  1,406 mg (05/23/2019), 1,406 mg (08/08/2019), 1,406 mg (08/22/2019), 1,406  mg (06/22/2019)  for chemotherapy treatment.    02/28/2019 Cancer Staging   Staging form: Exocrine Pancreas, AJCC 8th Edition - Clinical: Stage IB (cT2, cN0, cM0) - Signed by Tish Men, MD on 02/28/2019   04/16/2019 Imaging   CT CAP: IMPRESSION: 1. Margins of pancreatic neoplasm are difficult to identify. Similar appearance of soft tissue infiltration within the retroperitoneum with progressive narrowing of the portal venous confluence by tumor. No specific findings of solid organ metastasis or nodal metastasis within the abdomen or pelvis. 2. Unchanged appearance of multifocal sub solid nodules within the upper lung zones and bilateral lower lung zone tree-in-bud nodularity. As mentioned previously findings are nonspecific and may be inflammatory or infectious in etiology. Metastatic disease is not excluded. 3. Aortic atherosclerosis. 4. Right lobe of thyroid gland nodule. Consider further evaluation with thyroid ultrasound. If patient is clinically hyperthyroid, consider nuclear medicine thyroid uptake and scan.    PHYSICAL EXAMINATION:  Vitals:   12/31/19 2219 01/01/20 0541  BP: (!) 117/96 (!) 112/92  Pulse: 73 72  Resp:  18  Temp: 97.8 F (36.6 C) 98.2 F (36.8 C)  SpO2: 100% 100%   Filed Weights   12/24/19 1440  Weight: 39.6 kg    Intake/Output from previous day: 09/27 0701 - 09/28 0700 In: 2599.4 [P.O.:180; I.V.:1719.4; IV Piggyback:700] Out: 475 [Urine:475]  GENERAL: Alert, cachectic, no distress; minimal verbal interaction HEENT: Mild scleral icterus.  No thrush. SKIN: Small scattered ecchymoses. LUNGS: Right breath sounds diminished at the base.  No respiratory distress. HEART: regular rate & rhythm  ABDOMEN: Mild generalized tenderness. Vascular: Pitting edema at the lower legs bilaterally. NEURO: Follows commands  LABORATORY DATA:  I have reviewed the data as listed CMP Latest  Ref Rng & Units 01/01/2020 12/31/2019 12/30/2019  Glucose 70 - 99 mg/dL 83 89 79  BUN 6 - 20 mg/dL 22(H) 21(H) 21(H)  Creatinine 0.61 - 1.24 mg/dL 0.34(L) 0.30(L) <0.30(L)  Sodium 135 - 145 mmol/L 137 136 136  Potassium 3.5 - 5.1 mmol/L 3.9 3.7 3.9  Chloride 98 - 111 mmol/L 107 104 105  CO2 22 - 32 mmol/L 26 26 29   Calcium 8.9 - 10.3 mg/dL 7.4(L) 7.5(L) 7.5(L)  Total Protein 6.5 - 8.1 g/dL 4.9(L) 5.0(L) 5.2(L)  Total Bilirubin 0.3 - 1.2 mg/dL 2.4(H) 3.3(H) 2.0(H)  Alkaline Phos 38 - 126 U/L 425(H) 408(H) 376(H)  AST 15 - 41 U/L 156(H) 76(H) 37  ALT 0 - 44 U/L 83(H) 61(H) 53(H)    Lab Results  Component Value Date   WBC 16.4 (H) 01/01/2020   HGB 9.9 (L) 01/01/2020   HCT 29.5 (L) 01/01/2020   MCV 82.4 01/01/2020   PLT 11 (LL) 01/01/2020   NEUTROABS 15.4 (H) 01/01/2020    DG Chest 2 View  Result Date: 12/30/2019 CLINICAL DATA:  Pancreatic cancer with right-sided pleural effusion. EXAM: CHEST - 2 VIEW COMPARISON:  December 27, 2019 FINDINGS: Injectable port in stable position. Cardiomediastinal silhouette is normal. Mediastinal contours appear intact. Stable right pleural effusion. Multiple nodular opacities, predominantly in the lung bases persist. Osseous structures are without acute abnormality. Soft tissues are grossly normal. IMPRESSION: Stable moderate right pleural effusion. Stable appearance of multiple nodular opacities throughout both lungs. Electronically Signed   By: Fidela Salisbury M.D.   On: 12/30/2019 10:56   CT Head Wo Contrast  Result Date: 01/03/2020 CLINICAL DATA:  Pancreatic cancer, chemotherapy, weakness, headache EXAM: CT HEAD WITHOUT CONTRAST TECHNIQUE: Contiguous axial images were obtained from the base of the skull through  the vertex without intravenous contrast. COMPARISON:  None. FINDINGS: Brain: No evidence of acute infarction, hemorrhage, hydrocephalus, extra-axial collection or mass lesion/mass effect. Vascular: No hyperdense vessel or unexpected  calcification. Skull: Normal. Negative for fracture or focal lesion. Sinuses/Orbits: No acute finding. Other: None. IMPRESSION: No acute intracranial pathology. No non-contrast CT evidence of intracranial metastatic disease or findings to explain headache. Contrast enhanced MRI may be used to more sensitively evaluate for intracranial metastatic disease if desired. Electronically Signed   By: Eddie Candle M.D.   On: 12/08/2019 11:30   CT Angio Chest PE W and/or Wo Contrast  Result Date: 12/16/2019 CLINICAL DATA:  Shortness of breath and chest pain. Suspect pulmonary embolism. History of pancreatic cancer on chemotherapy. EXAM: CT ANGIOGRAPHY CHEST WITH CONTRAST TECHNIQUE: Multidetector CT imaging of the chest was performed using the standard protocol during bolus administration of intravenous contrast. Multiplanar CT image reconstructions and MIPs were obtained to evaluate the vascular anatomy. CONTRAST:  160mL OMNIPAQUE IOHEXOL 350 MG/ML SOLN COMPARISON:  08/29/2019 FINDINGS: Cardiovascular: Right IJ central venous catheter is present with tip over the cavoatrial junction. Heart is normal size. Thoracic aorta is normal caliber. Pulmonary arterial system is well opacified and demonstrates a single small embolus over a right lower lobar subsegmental pulmonary artery. No left-sided pulmonary emboli. Mediastinum/Nodes: No evidence of mediastinal or hilar adenopathy. Remaining mediastinal structures are unremarkable. Lungs/Pleura: Lungs are adequately inflated demonstrate a moderate size right pleural effusion with associated atelectasis in the right base. There are numerous small bilateral pulmonary nodules with interval progression likely worsening metastatic disease. Airways are unremarkable. Upper Abdomen: Not well evaluated on this arterial phase CT scan. Suggestion of partial visualization of patient's known biliary stent. Moderate pneumobilia is present. Couple low-density foci over the right lobe of the  liver with air-fluid levels of uncertain clinical significance. Musculoskeletal: No focal abnormality. Review of the MIP images confirms the above findings. IMPRESSION: 1. Single small embolus over a right lower lobar subsegmental pulmonary artery. 2. Moderate size right pleural effusion with associated right basilar atelectasis. 3. Interval progression of multiple pulmonary nodules likely metastatic disease due to patient's known pancreatic cancer. 4. Couple indeterminate low-density foci over the right lobe of the liver with air-fluid levels of not well evaluated on this arterial phase contrast CT. Pneumobilia and partially visualized biliary stent. Recommend clinical correlation as further evaluation with venous phase contrast enhanced CT of the abdomen/pelvis would be helpful. Critical Value/emergent results were called by telephone at the time of interpretation on 12/22/2019 at 3:08 pm to provider [facility] RAY , who verbally acknowledged these results. Electronically Signed   By: Marin Olp M.D.   On: 12/22/2019 15:10   CT ABDOMEN PELVIS W CONTRAST  Result Date: 12/31/2019 CLINICAL DATA:  Adnexal past.  History of pancreatic cancer. EXAM: CT ABDOMEN AND PELVIS WITH CONTRAST TECHNIQUE: Multidetector CT imaging of the abdomen and pelvis was performed using the standard protocol following bolus administration of intravenous contrast. CONTRAST:  159mL OMNIPAQUE IOHEXOL 300 MG/ML  SOLN COMPARISON:  December 25, 2019. FINDINGS: Lower chest: Moderate bilateral pleural effusions are noted with multiple nodule seen involving the visualized lung bases consistent with metastatic disease. Hepatobiliary: Continued presence of stent within common bile duct. There has been placement of a nother stent within the common bile duct that extends from cavitary abnormality in upper pole of right kidney and through common bile duct into the duodenum. No gallstones are noted. Extensive pneumobilia is again noted. There remains  multiple rounded low densities some of  which with air-fluid levels, concerning for hepatic abscesses. The largest measures 4.3 cm inferiorly in the right hepatic lobe. Pancreas: There is again noted pancreatic head mass which is poorly defined. It appears to surround portions of the celiac and superior mesenteric arteries. It is difficult to determine the size given its ill-defined and infiltrative nature. Spleen: Normal in size without focal abnormality. Adrenals/Urinary Tract: Adrenal glands and kidneys appear normal. No hydronephrosis or renal obstruction is noted. Moderate urinary bladder distention is noted. Stomach/Bowel: Stomach is within normal limits. Appendix appears normal. No evidence of bowel wall thickening, distention, or inflammatory changes. Vascular/Lymphatic: Atherosclerosis of abdominal aorta is noted without aneurysm or dissection. As noted above, there appears to be extension of the pancreatic mass around the vasculature of the proximal abdominal aorta and celiac and superior mesenteric branches. No discrete adenopathy is noted. 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 Reproductive: Prostate is unremarkable. Other: Significantly increased anasarca is noted. Mildly increased ascites is noted as well. Musculoskeletal: No acute or significant osseous findings. IMPRESSION: 1. 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 Significantly increased ascites and anasarca is noted compared to prior exam. These results will be called to the ordering clinician or representative by the Radiologist Assistant, and communication documented in the PACS or zVision Dashboard. 2. Moderate bilateral pleural effusions are noted with multiple nodules seen involving the visualized lung bases consistent with metastatic disease. 3. Continued presence of stent within common bile duct. There has been placement of another stent within  the common bile duct that extends from cavitary abnormality in upper pole of right kidney and through common bile duct into the duodenum. Extensive pneumobilia is again noted. 4. Multiple rounded low densities are noted some of which with air-fluid levels, concerning for hepatic abscesses. The largest measures 4.3 cm inferiorly in the right hepatic lobe. 5. There is again noted pancreatic head mass which is poorly defined. It is difficult to determine the size given its ill-defined and infiltrative nature. It appears to surround portions of the celiac and superior mesenteric arteries. 6. Moderate urinary bladder distention is noted. 7. Aortic atherosclerosis. Aortic Atherosclerosis (ICD10-I70.0). Electronically Signed   By: Marijo Conception M.D.   On: 12/31/2019 15:58   CT ABDOMEN PELVIS W CONTRAST  Result Date: 12/25/2019 CLINICAL DATA:  39 year old male with history of abdominal pain. Suspected abscess. EXAM: CT ABDOMEN AND PELVIS WITH CONTRAST TECHNIQUE: Multidetector CT imaging of the abdomen and pelvis was performed using the standard protocol following bolus administration of intravenous contrast. CONTRAST:  19mL OMNIPAQUE IOHEXOL 300 MG/ML  SOLN COMPARISON:  CT the abdomen and pelvis 08/29/2019. FINDINGS: Lower chest: Moderate right and small left pleural effusions lying dependently. Some pleural enhancement and thickening in the lower right hemithorax suggestive of malignant pleural involvement. Numerous pulmonary nodules scattered throughout the visualize lung bases, including areas of confluent nodularity in the lower lobes of the lungs bilaterally, most compatible with progressive metastatic disease. Hepatobiliary: Compared to the prior study there are numerous low-attenuation lesions within the liver, highly suspicious for multifocal intrahepatic abscesses (less likely to represent centrally necrotic metastatic lesions). The largest of these has internal septations and measures up to 3.6 x 3.5 cm in  segment 5 (axial image 30 of series 2). One of these lesions in segment 7/8 of the liver has some non dependent gas, strongly favoring abscess. Extensive intrahepatic biliary ductal dilatation with a large volume  of pneumobilia. Gallbladder is unremarkable in appearance. Interval placement of common bile duct stent which appears appropriately located. Pancreas: Poorly defined pancreatic mass in the superior aspect of the head of the pancreas (soft tissues in the superior aspect of the head of the pancreas are somewhat indistinct, potentially treatment related). Haziness in the peripancreatic fat, including soft tissue around portions of the celiac axis, common hepatic artery and proximal superior mesenteric artery, as well as the left renal vein, all of which appear narrowed, likely indicative of locally infiltrative tumor. This lesion is difficult to definitively identify and measure, but is estimated to measure approximately 4.4 x 3.0 cm (axial image 27 of series 2). Spleen: The appearance of the spleen is unremarkable. Adrenals/Urinary Tract: Bilateral kidneys and adrenal glands are normal in appearance. No hydroureteronephrosis. Urinary bladder is unremarkable in appearance. Stomach/Bowel: The appearance of the stomach is unremarkable. No pathologic dilatation of small bowel or colon. Portions of the colon appear thickened with some mucosal hyperenhancement (poorly evaluated given the adjacent ascites), best appreciated in the region of the cecum, ascending colon and hepatic flexure, suggestive of underlying colitis. Appendix is not confidently identified. Vascular/Lymphatic: Aortic atherosclerosis, without evidence of aneurysm or dissection in the abdominal or pelvic vasculature. Vascular involvement from the pancreatic mass, discussed above. Poorly defined infiltrative soft tissue throughout the retroperitoneum adjacent to the pancreatic mass, with soft tissue completely encircling the upper abdominal aorta  immediately below the level of the renal arteries (axial image 30 of series 2), presumably indicative of lymphatic spread of disease. Reproductive: Prostate gland and seminal vesicles are unremarkable in appearance. Other: Large volume of ascites. No pneumoperitoneum. Diffuse body wall edema. Musculoskeletal: There are no aggressive appearing lytic or blastic lesions noted in the visualized portions of the skeleton. IMPRESSION: 1. Today's study demonstrates what appears to be progression of disease with innumerable metastatic lesions throughout the visualize lung bases, probable malignant moderate right pleural effusion and small left pleural effusion, enlargement of the primary pancreatic mass now with vascular involvement, and what appears to be evolving retroperitoneal lymphadenopathy encasing the abdominal aorta. 2. In addition, there has been interval placement of a common bile duct stent, now with substantial intrahepatic biliary ductal dilatation, large amount of pneumatosis, and multiple new low-attenuation liver lesions, some of which have internal gas fluid levels, highly concerning for multiple intra-abdominal abscesses and probable cholangitis. 3. Profound mural thickening and mucosal hyperenhancement in portions of the colon (predominantly cecum, ascending colon and hepatic flexure), concerning for colitis. 4. Aortic atherosclerosis. 5. Additional incidental findings, as above. Electronically Signed   By: Vinnie Langton M.D.   On: 12/25/2019 14:50   DG CHEST PORT 1 VIEW  Result Date: 12/31/2019 CLINICAL DATA:  History of metastatic pancreatic cancer. History of cholangitis and hepatic abscesses. Pleural effusion. EXAM: PORTABLE CHEST 1 VIEW COMPARISON:  12/30/2019. FINDINGS: PowerPort catheter stable position. Heart size normal. Multiple nodular opacities are again noted throughout both lungs. Persistent bibasilar atelectasis and or infiltrates. Moderate right pleural effusion slightly increased  from prior exam noted. No pneumothorax. Biliary stent right upper quadrant again noted. IMPRESSION: 1. Moderate right pleural effusion, slightly increased from prior exam. 2. Multiple nodular opacities again noted throughout both lungs. Persistent bibasilar atelectasis and or infiltrates. Electronically Signed   By: Marcello Moores  Register   On: 12/31/2019 10:48   DG Chest Port 1 View  Result Date: 12/16/2019 CLINICAL DATA:  39 year old male, status post thoracentesis EXAM: PORTABLE CHEST 1 VIEW COMPARISON:  Chest CT 12/22/2019, chest x-ray 12/23/2018 FINDINGS:  Cardiomediastinal silhouette unchanged in size and contour. Right IJ port catheter unchanged. Micro nodular pattern of opacity throughout the lungs, compatible with findings on the prior CT. Blunting of the right costophrenic angle persists, with no pneumothorax. No left-sided pleural fluid.  No new confluent airspace disease. IMPRESSION: No complicating features status post right-sided thoracentesis, with persistent small fluid/atelectasis at the right lung base. Micro nodular pattern of opacity of the bilateral lungs compatible with findings on the recent prior CT chest. Unchanged right IJ port catheter. Electronically Signed   By: Corrie Mckusick D.O.   On: 12/09/2019 16:04   DG Chest Port 1 View  Result Date: 01/01/2020 CLINICAL DATA:  Pancreatic cancer, weakness EXAM: PORTABLE CHEST 1 VIEW COMPARISON:  08/29/2019 chest CT. FINDINGS: Right internal jugular Port-A-Cath terminates at the cavoatrial junction. Normal heart size. Normal mediastinal contour. No pneumothorax. New small right pleural effusion. No left pleural effusion. No pulmonary edema. Hazy patchy right lung base opacity. Indistinct tiny nodular opacities scattered in the lower lungs bilaterally. IMPRESSION: 1. New small right pleural effusion. 2. Hazy patchy right lung base opacity, favor atelectasis. 3. Indistinct tiny nodular opacities scattered in the lower lungs bilaterally, as seen on  prior chest CT, cannot exclude metastatic disease. Electronically Signed   By: Ilona Sorrel M.D.   On: 12/16/2019 11:40   DG ERCP  Result Date: 12/23/2019 CLINICAL DATA:  Pancreas cancer, cholangitis, sepsis, stent occlusion EXAM: ERCP with common bile duct stent revision TECHNIQUE: Multiple spot images obtained with the fluoroscopic device and submitted for interpretation post-procedure. FLUOROSCOPY TIME:  Fluoroscopy Time:  10 minutes 37 seconds Radiation Exposure Index (if provided by the fluoroscopic device): Dense 110 mGy Number of Acquired Spot Images: Numerous spot fluoroscopic view COMPARISON:  12/25/2019 FINDINGS: Spot fluoroscopic views during ERCP confirm wire and catheter traversing the biliary metallic stent. Proximal contrast injection above the stent confirms biliary obstruction from stent occlusion. Endoscopic plastic biliary stent now traverses the previous metallic stent. 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 Successful plastic endoscopic stent placement traversing the proximal biliary tree through the CBD metallic stent. These images were submitted for radiologic interpretation only. Please see the procedural report for the amount of contrast and the fluoroscopy time utilized. Electronically Signed   By: Jerilynn Mages.  Shick M.D.   On: 12/16/2019 13:05   US THORACENTESIS ASP PLEURAL SPACE W/IMG GUIDE  Result Date: 12/06/2019 INDICATION: Patient with history of pancreatic cancer, failure to thrive, and right pleural effusion. Request made for diagnostic and therapeutic right thoracentesis EXAM: ULTRASOUND GUIDED DIAGNOSTIC AND THERAPEUTIC RIGHT THORACENTESIS MEDICATIONS: 8 mL 1% lidocaine COMPLICATIONS: None immediate. PROCEDURE: An ultrasound guided thoracentesis was thoroughly discussed with the patient and questions answered. The benefits, risks, alternatives and complications were also discussed. The patient understands and wishes to proceed with the  procedure. Written consent was obtained. Ultrasound was performed to localize and mark an adequate pocket of fluid in the right chest. The area was then prepped and draped in the normal sterile fashion. 1% Lidocaine was used for local anesthesia. Under ultrasound guidance a 6 Fr Safe-T-Centesis catheter was introduced. Thoracentesis was performed. The catheter was removed and a dressing applied. FINDINGS: A total of approximately 850 mL of dark red fluid was removed. Samples were sent to the laboratory as requested by the clinical team. IMPRESSION: Successful ultrasound guided right thoracentesis yielding 850 mL of pleural fluid. Read by: Earley Abide, PA-C Electronically Signed   By: Corrie Mckusick D.O.   On: 12/08/2019 16:00  ASSESSMENT AND PLAN: 1. Pancreas cancer  11/29/2018 CT abdomen/pelvis without contrast-appendix not well visualized, limited evaluation due to lack of enteric contrast and perineal fat  12/15/2018 CT abdomen/pelvis with contrast-possible 1.5 cm mass within the mid aspect of the pancreatic body with associated atrophy of the upstream pancreatic tail and associated pancreatic ductal dilatation. Soft tissue fullness around the superior mesenteric artery and celiac axis. Tree-in-bud nodularity within the peripheral aspect of the lower lobes bilaterally.  12/28/2018 upper EUS-2.4 cm x 1.9 cm mass in the body of the pancreas causing main pancreatic duct obstruction and clearly involving the celiac trunk. Heterogeneous soft tissue mass in the neck measuring 3.6 cm. Fine-needle aspiration pancreas with malignant cells consistent with adenocarcinoma.  01/16/2019 Port-A-Cath placement  01/17/2019 PET scan-suspected enlargement of the pancreatic mass with abnormal hypermetabolic activity at the junction of the pancreatic body and tail measuring 3.4 x 2.9 x 2.4 cm, thought to be a high likelihood tumor extension around the vicinity of the celiac trunk and possibly the superior  mesenteric artery. Potential mild left periaortic adenopathy maximum SUV of the indistinct left periaortic lymph nodes approximately 3.3 which is above blood pool levels. Scattered groundglass density nodules in the lungs favoring the upper lobes with tree-in-bud nodularity in the lung bases favoring the lower lobes. None of the lesions are hypermetabolic.  Gemcitabine/Abraxane 01/24/2019-08/22/2019  04/16/2019 CT chest/abdomen/pelvis-margins of pancreatic neoplasm difficult to identify. Similar appearance of soft tissue infiltration within the retroperitoneum with progressive narrowing of the portal venous confluence by tumor. No specific findings of solid organ metastasis or nodal metastasis within the abdomen or pelvis. Unchanged appearance of multifocal subsolid nodules within the upper lung zones and bilateral lower lung zone tree-in-bud nodularity. Right lobe of thyroid gland nodule.  This  CT 08/29/2019-development of intra and extrahepatic biliary duct dilatation with abrupt cut off at the common bile duct, abnormal soft tissue in the retroperitoneum attenuates the portal vein and the splenic vein is occluded, fullness in the head of the pancreas appears more prominent without discrete margins, subtle 12 mm focus in the medial left liver   MRI liver 09/20/2019-heterogeneous hepatic hyperenhancement favored to represent perfusion anomalies in the setting of portal vein narrowing.  No convincing evidence of hepatic metastasis.  Locally advanced pancreatic carcinoma suboptimally evaluated.  Persistent mild intrahepatic biliary duct dilatation.  Subtle peripancreatic edema.  CT chest 01/01/2020-single small embolus over a right lower lobar subsegmental pulmonary artery.  Moderate sized right pleural effusion with associated right basilar atelectasis.  Interval progression of multiple pulmonary nodules likely metastatic disease.  Indeterminate low-density foci over the right lobe of the liver with  air-fluid levels.  Pneumobilia and partially visualized biliary stent.  CT abdomen/pelvis 12/25/2019-moderate right and small left pleural effusions.  Some pleural enhancement and thickening in the lower right hemithorax suggestive of malignant pleural involvement.  Numerous pulmonary nodules scattered throughout the visualized lung bases most compatible with progressive metastatic disease. Interval placement of a common bile duct stent, now with substantial intrahepatic biliary ductal dilatation, large amount of pneumatosis and multiple new low-attenuation liver lesions some of which have internal gas fluid levels highly concerning for multiple intra-abdominal abscesses and probable cholangitis.  Profound mural thickening and mucosal hyperenhancement in portions of the colon concerning for colitis. 2. Abdominal/back pain secondary to #1 3. Port-A-Cath placement 01/16/2019 4. PE on chest CT 12/07/2019.  Anticoagulation on hold due to severe thrombocytopenia. 5. Right pleural effusion on chest CT 12/14/2019-thoracentesis 12/27/2019 6. Admission with sepsis syndrome secondary to biliary obstruction  secondary by #1-status post ERCP with placement of a new bile duct stent 12/15/2019  Mr. Vespa appears stable.  Cytology from thoracentesis returned this morning showing malignant cells consistent with metastatic adenocarcinoma.  These results have been discussed with the patient and his mother.  We discussed with the patient that he is not a candidate for chemotherapy given his poor nutritional status and poor functional status.  Recommend referral to hospice.  Patient is undecided and would like to talk with his wife more about this.  He is agreeable to placing transitional care consult to initiate referral to hospice.  CBC from this morning has been reviewed.  Platelet count has dropped to 11,000.  There is no active bleeding.  Thrombocytopenia likely due to underlying liver abscesses.  Recommend platelet  transfusion only if active bleeding or prior to any procedure.  I have discontinued the platelet transfusion that was ordered for today.  The patient is tentatively scheduled for drain placement today by IR.  We recommended against proceeding with this.  We can try to control his liver abscesses with oral antibiotics.  He will think about this.  Recommendations: 1.  Transition of care consult has been placed to initiate referral to hospice.  I will also reach out to the palliative care team for assistance with ongoing discussion regarding goals of care. 2.  Hold off on platelet transfusion unless active bleeding or prior to any procedure. 3.  Continue to hold anticoagulation and filter placement for now.  He can be considered for Lovenox if his platelets rise. 4.  Continue narcotic analgesics for pain. 5.  We recommend holding off on drain placement by IR today.    LOS: 9 days   Mikey Bussing, AGPCNP-BC 01/01/20  Mr. Boyte was interviewed and examined.  I saw him early this morning and again at approximately noon.  His mother and wife were present when I saw him this afternoon.  I discussed the positive pleural fluid cytology finding with him.  He has metastatic pancreas cancer.  The prognosis is poor, especially with his poor performance status.  He has developed recurrent severe thrombocytopenia.  This is most likely related to the hepatic abscesses with bone marrow suppression and/or DIC.  It would be difficult for him to undergo multiple drainage procedures.  He understands this would not treat the underlying cancer.  He is not a candidate for further chemotherapy.  I recommend hospice care.  We discussed CODE STATUS.  He is not ready to make a decision on CODE STATUS or hospice today.  He wishes to discuss disposition plans further with his wife and mother.  Disposition options include home with hospice, skilled nursing facility placement, and [facility].  He may be a [employer]  candidate if a decision is made to discontinue IV antibiotics and not administer additional transfusions.  Platelets can be given if a decision is made to proceed with drainage of the liver abscess.  Otherwise I recommend platelets only for bleeding.

## 2020-01-01 NOTE — Evaluation (Signed)
Occupational Therapy Evaluation Patient Details Name: Gabriel [name] MRN: [id number] DOB: April 08, 1980 Today's Date: 01/01/2020    History of Present Illness 39 yo male admitted with UTI, sepsis, weakness, PE. Recent dx of pancreatic cancer   Clinical Impression   Pt admitted with UTI. Pt currently with functional limitations due to the deficits listed below (see OT Problem List).  Pt will benefit from skilled OT to increase their safety and independence with ADL and functional mobility for ADL to facilitate discharge to venue listed below.   Pt needs significant A with ADL activity. Pt did sit EOB with OT but did not stand.  Noted family establishing goals of care.     Follow Up Recommendations  SNF;Home health OT;Other (comment);Supervision/Assistance - 24 hour (chart does mention hospice as well)          Precautions / Restrictions Precautions Precautions: Fall      Mobility Bed Mobility Overal bed mobility: Needs Assistance Bed Mobility: Supine to Sit     Supine to sit: HOB elevated;Max assist     General bed mobility comments: Pt left sitting EOB with HOB raised (leaning on bed holding onto bed rail)  with RN and mother present  Transfers                 General transfer comment: did not perform    Balance Overall balance assessment: Needs assistance Sitting-balance support: Bilateral upper extremity supported;Feet unsupported Sitting balance-Leahy Scale: Poor                                     ADL either performed or assessed with clinical judgement   ADL Overall ADL's : Needs assistance/impaired Eating/Feeding: Minimal assistance;Sitting;Moderate assistance   Grooming: Minimal assistance;Sitting;Moderate assistance   Upper Body Bathing: Moderate assistance;Sitting   Lower Body Bathing: +2 for physical assistance;+2 for safety/equipment;Sitting/lateral leans;Total assistance   Upper Body Dressing : Moderate assistance;Sitting    Lower Body Dressing: Sitting/lateral leans;+2 for physical assistance;+2 for safety/equipment;Total assistance                 General ADL Comments: Pt did sit EOB with OT for approx 10 min with BUE support.  Pt resistant to sitting EOB initially but then was pleased to be in a new position     Vision Patient Visual Report: No change from baseline              Pertinent Vitals/Pain Pain Assessment: Faces Pain Score: 3  Pain Location: generalized Pain Descriptors / Indicators: Discomfort Pain Intervention(s): Limited activity within patient's tolerance;Repositioned     Hand Dominance     Extremity/Trunk Assessment Upper Extremity Assessment Upper Extremity Assessment: Generalized weakness (weakness BUE . Encouraged BUE AROM and AAROM)           Communication Communication Communication: No difficulties   Cognition Arousal/Alertness: Awake/alert Behavior During Therapy: WFL for tasks assessed/performed Overall Cognitive Status: Impaired/Different from baseline                                 General Comments: increased time to respond to questions   General Comments               Home Living Family/patient expects to be discharged to:: Hospice/Palliative care Living Arrangements: Spouse/significant other Available Help at Discharge: Family;Available PRN/intermittently Type of Home: House  Home Layout: One level               Home Equipment: None          Prior Functioning/Environment Level of Independence: Needs assistance  Gait / Transfers Assistance Needed: was ambulatory up until some point prior to admission. ADL's / Homemaking Assistance Needed: unsure   Comments: history unclear at times        OT Problem List: Decreased strength;Impaired balance (sitting and/or standing);Decreased activity tolerance      OT Treatment/Interventions: Self-care/ADL training;Patient/family education;Therapeutic activities     OT Goals(Current goals can be found in the care plan section) Acute Rehab OT Goals Patient Stated Goal: to move around OT Goal Formulation: With patient Time For Goal Achievement: 01/09/20 Potential to Achieve Goals: Poor  OT Frequency: Min 2X/week   Barriers to D/C:               AM-PAC OT "6 Clicks" Daily Activity     Outcome Measure Help from another person eating meals?: A Little Help from another person taking care of personal grooming?: A Little Help from another person toileting, which includes using toliet, bedpan, or urinal?: Total Help from another person bathing (including washing, rinsing, drying)?: A Lot Help from another person to put on and taking off regular upper body clothing?: A Lot Help from another person to put on and taking off regular lower body clothing?: Total 6 Click Score: 12   End of Session Nurse Communication: Mobility status  Activity Tolerance: Patient limited by fatigue Patient left: in bed;with family/visitor present;with nursing/sitter in room  OT Visit Diagnosis: Unsteadiness on feet (R26.81);Muscle weakness (generalized) (M62.81);Other abnormalities of gait and mobility (R26.89);History of falling (Z91.81)                Time: 1100-1127 OT Time Calculation (min): 27 min Charges:  OT General Charges $OT Visit: 1 Visit OT Evaluation $OT Eval Moderate Complexity: 1 Mod OT Treatments $Self Care/Home Management : 8-22 mins  Kari Baars, OT Acute Rehabilitation Services [phone] Office- [phone], Edwena Felty D 01/01/2020, 12:52 PM

## 2020-01-02 DIAGNOSIS — L89309 Pressure ulcer of unspecified buttock, unspecified stage: Secondary | ICD-10-CM

## 2020-01-02 DIAGNOSIS — Z7189 Other specified counseling: Secondary | ICD-10-CM

## 2020-01-02 DIAGNOSIS — E43 Unspecified severe protein-calorie malnutrition: Secondary | ICD-10-CM

## 2020-01-02 LAB — CBC WITH DIFFERENTIAL/PLATELET
Abs Immature Granulocytes: 0.09 10*3/uL — ABNORMAL HIGH (ref 0.00–0.07)
Basophils Absolute: 0 10*3/uL (ref 0.0–0.1)
Basophils Relative: 0 %
Eosinophils Absolute: 0 10*3/uL (ref 0.0–0.5)
Eosinophils Relative: 0 %
HCT: 31.5 % — ABNORMAL LOW (ref 39.0–52.0)
Hemoglobin: 10.6 g/dL — ABNORMAL LOW (ref 13.0–17.0)
Immature Granulocytes: 1 %
Lymphocytes Relative: 5 %
Lymphs Abs: 0.8 10*3/uL (ref 0.7–4.0)
MCH: 27.2 pg (ref 26.0–34.0)
MCHC: 33.7 g/dL (ref 30.0–36.0)
MCV: 81 fL (ref 80.0–100.0)
Monocytes Absolute: 0.3 10*3/uL (ref 0.1–1.0)
Monocytes Relative: 2 %
Neutro Abs: 13.8 10*3/uL — ABNORMAL HIGH (ref 1.7–7.7)
Neutrophils Relative %: 92 %
Platelets: 13 10*3/uL — CL (ref 150–400)
RBC: 3.89 MIL/uL — ABNORMAL LOW (ref 4.22–5.81)
RDW: 14.5 % (ref 11.5–15.5)
WBC: 15 10*3/uL — ABNORMAL HIGH (ref 4.0–10.5)
nRBC: 0 % (ref 0.0–0.2)

## 2020-01-02 LAB — COMPREHENSIVE METABOLIC PANEL
ALT: 82 U/L — ABNORMAL HIGH (ref 0–44)
AST: 102 U/L — ABNORMAL HIGH (ref 15–41)
Albumin: 1.7 g/dL — ABNORMAL LOW (ref 3.5–5.0)
Alkaline Phosphatase: 433 U/L — ABNORMAL HIGH (ref 38–126)
Anion gap: 5 (ref 5–15)
BUN: 23 mg/dL — ABNORMAL HIGH (ref 6–20)
CO2: 26 mmol/L (ref 22–32)
Calcium: 7.5 mg/dL — ABNORMAL LOW (ref 8.9–10.3)
Chloride: 107 mmol/L (ref 98–111)
Creatinine, Ser: <0.3 mg/dL — ABNORMAL LOW (ref 0.61–1.24)
Glucose, Bld: 102 mg/dL — ABNORMAL HIGH (ref 70–99)
Potassium: 3.9 mmol/L (ref 3.5–5.1)
Sodium: 138 mmol/L (ref 135–145)
Total Bilirubin: 2.8 mg/dL — ABNORMAL HIGH (ref 0.3–1.2)
Total Protein: 5.2 g/dL — ABNORMAL LOW (ref 6.5–8.1)

## 2020-01-02 NOTE — Progress Notes (Signed)
CRITICAL VALUE STICKER  CRITICAL VALUE: Plt 13  MD NOTIFIED: M. Denny    TIME OF NOTIFICATION: 01/02/20 @ 0535  RESPONSE: No new orders

## 2020-01-02 NOTE — Progress Notes (Signed)
Daily Progress Note   Patient Name: Gabriel [name]       Date: 01/02/2020 DOB: May 04, 1980  Age: 39 y.o. MRN#: [id number] Attending Physician: Mercy Riding, MD Primary Care Physician: Kerin Perna, NP Admit Date: 12/30/2019  Reason for Consultation/Follow-up: Establishing goals of care  Subjective:  awake alert, mother at bedside, no distress. Appears weak with muscle wasting. Dr Cyndia Skeeters also [place] MD also in the room, we discussed briefly about disposition and overall goals of care, see below.   Length of Stay: 10  Current Medications: Scheduled Meds:  . sodium chloride   Intravenous Once  . sodium chloride   Intravenous Once  . Chlorhexidine Gluconate Cloth  6 each Topical Daily  . feeding supplement  1 Container Oral BID BM  . feeding supplement (KATE FARMS STANDARD 1.4)  325 mL Oral Daily  . indomethacin  100 mg Rectal Once  . metoprolol succinate  12.5 mg Oral Daily  . morphine  60 mg Oral Q12H  . multivitamin with minerals  1 tablet Oral Daily  . nystatin  5 mL Oral QID  . sodium chloride flush  3 mL Intravenous Q12H    Continuous Infusions: . ciprofloxacin Stopped (01/02/20 0300)  . dextrose 5 % and 0.9% NaCl 100 mL/hr at 01/02/20 0137  . metronidazole Stopped (01/02/20 0520)    PRN Meds: morphine, morphine injection, sodium chloride flush  Physical Exam         Awake alert No distress Appears frail weak Has muscle wasting No edema Regular   Vital Signs: BP 102/89 (BP Location: Left Arm)   Pulse 98   Temp 97.7 F (36.5 C) (Oral)   Resp 18   Ht 5\' 8"  (1.727 m)   Wt 39.6 kg   SpO2 100%   BMI 13.27 kg/m  SpO2: SpO2: 100 % O2 Device: O2 Device: Room Air O2 Flow Rate: O2 Flow Rate (L/min): 8 L/min  Intake/output summary:   Intake/Output Summary  (Last 24 hours) at 01/02/2020 0950 Last data filed at 01/02/2020 0520 Gross per 24 hour  Intake 3206.13 ml  Output --  Net 3206.13 ml   LBM: Last BM Date: 01/01/20 Baseline Weight: Weight: 39.6 kg Most recent weight: Weight: 39.6 kg       Palliative Assessment/Data:    Flowsheet Rows  Most Recent Value  Intake Tab  Referral Department Hospitalist  Unit at Time of Referral Med/Surg Unit  Palliative Care Primary Diagnosis Cancer  Date Notified 12/22/2019  Palliative Care Type New Palliative care  Reason for referral Clarify Goals of Care  Date of Admission 12/18/2019  Date first seen by Palliative Care 12/25/19  # of days Palliative referral response time 2 Day(s)  # of days IP prior to Palliative referral 0  Clinical Assessment  Palliative Performance Scale Score 50%  Psychosocial & Spiritual Assessment  Palliative Care Outcomes  Patient/Family meeting held? Yes  Who was at the meeting? Patient, fiancee, mother  Palliative Care Outcomes Clarified goals of care      Patient Active Problem List   Diagnosis Date Noted  . Protein-calorie malnutrition, severe 01/02/2020  . Thrombocytopenia ([place])   . Pressure injury of skin 12/26/2019  . Failure to thrive in adult 12/30/2019  . Sepsis secondary to UTI ([place]) 12/29/2019  . Pulmonary embolism ([place]) 12/11/2019  . Pleural effusion 12/14/2019  . Dilated bile duct   . Pancreatic mass   . Anemia due to antineoplastic chemotherapy 02/14/2019  . Family history of GI malignancy 02/14/2019  . Protein malnutrition ([place]) 01/24/2019  . Family history of breast cancer   . Family history of prostate cancer   . Cancer-related pain 01/10/2019  . Goals of care, counseling/discussion 01/10/2019  . Malignant neoplasm of head of pancreas ([place])   . Weight loss     Palliative Care Assessment & Plan   Patient Profile:  Gabriel [name] is a 39 year old male with PMHx metastatic pancreatic cancer on palliative chemotherapy with plan for  radiation who was admitted with decreased appetite and weakness.  CT revealed subsegmental PE, moderate right effusion, liver abscesses, and metastatic disease in lower lungs.  Palliative consulted for goals of care.    Assessment:  generalized weakness.  Generalized pain.  Functional decline.   Recommendations/Plan:   continue cheduled MS Contin 60 mg PO Q 12 hours, also on MSIR 15 mg PO Q 4 hours PRN. Also has rescue IV Morphine PRN available.   Discussed with patient and mother about [facility] consult and further information about hospice. Discussed with mother about having hospice liaison directly contact the patient's wife to consolidate the discharge plan, patient does want to go home towards the end of this hospitalization. Recommend home with hospice and full focus on comfort measures and aggressive symptom management.    Code Status:    Code Status Orders  (From admission, onward)         Start     Ordered   12/08/2019 1729  Full code  Continuous        12/22/2019 1728        Code Status History    This patient has a current code status but no historical code status.   Advance Care Planning Activity       Prognosis:   guarded  Discharge Planning:  Recommend home with hospice.   Care plan was discussed with  Patient, mother. TRH MD.   Thank you for allowing the Palliative Medicine Team to assist in the care of this patient.   Time In: 9.20 Time Out: 9.45 Total Time 25 Prolonged Time Billed  no       Greater than 50%  of this time was spent counseling and coordinating care related to the above assessment and plan.  Loistine Chance, MD  Please contact Palliative Medicine Team phone at [phone]  for questions and concerns.

## 2020-01-02 NOTE — Progress Notes (Addendum)
HEMATOLOGY-ONCOLOGY PROGRESS NOTE  SUBJECTIVE: Resting in bed.  Mother is present in the room.  He states he has not made a decision on hospice.  He wants to discuss this more with his wife but does state that he wants to go home.  He denies bleeding.  He has noted some swelling of the right upper arm.  He reports current pain medication is effective.  Oncology History  Malignant neoplasm of head of pancreas [facility])   Initial Diagnosis   Pancreatic adenocarcinoma ([place])   12/15/2018 Imaging   CT abdomen/pelvis w/ contrast: IMPRESSION: 1. Suggestion of a 1.5 cm mass within the mid aspect of the pancreatic body with associated atrophy of the upstream pancreatic tail and associated pancreatic ductal dilatation. Recommend further evaluation with MRI. There is soft tissue fullness around the superior mesenteric artery and celiac axis. This may represent edema or potentially tumor involvement. 2. Tree-in-bud nodularity within the peripheral aspect of the lower lobes bilaterally which may be secondary to an infectious or inflammatory process. Recommend follow-up chest CT in 3 months to assess for interval change. 3. These results will be called to the ordering clinician or representative by the Radiologist Assistant, and communication documented in the PACS or zVision Dashboard.   12/29/2018 Procedure   EUS: -2.4 x 1.9cm mass in the body of the pancreas causing the main pancreatic duct obstruction and clearly involving the celiac trunk. Biopsied. -Heterogenous soft tissue mass in the neck, measuring 3.6cm maximally. This was not sampled, presumed to be a large thyroid gland.  -No peripancreatic adenopathy -CBD was normal, non-dilated.   12/29/2018 Pathology Results   CASE: [id number]   DIAGNOSIS:  - Malignant cells consistent with adenocarcinoma  - See comment.   01/17/2019 Imaging   PET: IMPRESSION: 1. Suspected enlargement of the pancreatic mass, with abnormal hypermetabolic  activity at the junction of the pancreatic body and tail currently measuring 3.4 by 2.9 by 2.4 cm. Although anatomic localization is hampered by the patient's extreme paucity of adipose tissue (making separation of adjacent structures difficult on noncontrast CT), there is thought to be a high likelihood tumor extending around the vicinity of the celiac trunk and possibly the superior mesenteric artery. In this case, MRI might provide better anatomic localization with respect to perivascular involvement. 2. Potential mild left periaortic adenopathy, maximum SUV of the indistinct left periaortic lymph nodes is approximately 3.3 which is above blood pool levels. 3. Scattered ground-glass density nodules in the lungs favoring the upper lobes, with tree-in-bud nodularity in the lung bases favoring the lower lobes. None of these lesions are hypermetabolic. While possibilities include atypical infectious process drug reaction, surveillance of the chest is recommended to exclude low-grade adenocarcinoma. 4.  Aortic Atherosclerosis (ICD10-I70.0).   01/24/2019 -  Chemotherapy   The patient had PACLitaxel-protein bound (ABRAXANE) chemo infusion 150 mg, 100 mg/m2 = 150 mg (100 % of original dose 100 mg/m2), Intravenous,  Once, 6 of 8 cycles Dose modification: 100 mg/m2 (original dose 100 mg/m2, Cycle 1, Reason: Provider Judgment) Administration: 150 mg (01/24/2019), 150 mg (02/14/2019), 150 mg (03/02/2019), 150 mg (03/16/2019), 150 mg (04/26/2019), 150 mg (07/04/2019), 150 mg (07/18/2019), 150 mg (05/09/2019), 150 mg (05/23/2019), 150 mg (08/08/2019), 150 mg (08/22/2019), 150 mg (06/22/2019) gemcitabine (GEMZAR) 1,102 mg in sodium chloride 0.9 % 250 mL chemo infusion, 800 mg/m2 = 1,102 mg (100 % of original dose 800 mg/m2), Intravenous,  Once, 6 of 8 cycles Dose modification: 800 mg/m2 (original dose 800 mg/m2, Cycle 1, Reason: Provider Judgment), 1,000 mg/m2 (  original dose 800 mg/m2, Cycle 5, Reason: Provider  Judgment) Administration: 1,102 mg (01/24/2019), 1,102 mg (02/14/2019), 1,102 mg (03/02/2019), 1,102 mg (03/16/2019), 1,102 mg (04/26/2019), 1,406 mg (07/04/2019), 1,406 mg (07/18/2019), 1,406 mg (05/09/2019), 1,406 mg (05/23/2019), 1,406 mg (08/08/2019), 1,406 mg (08/22/2019), 1,406 mg (06/22/2019)  for chemotherapy treatment.    02/28/2019 Cancer Staging   Staging form: Exocrine Pancreas, AJCC 8th Edition - Clinical: Stage IB (cT2, cN0, cM0) - Signed by Tish Men, MD on 02/28/2019   04/16/2019 Imaging   CT CAP: IMPRESSION: 1. Margins of pancreatic neoplasm are difficult to identify. Similar appearance of soft tissue infiltration within the retroperitoneum with progressive narrowing of the portal venous confluence by tumor. No specific findings of solid organ metastasis or nodal metastasis within the abdomen or pelvis. 2. Unchanged appearance of multifocal sub solid nodules within the upper lung zones and bilateral lower lung zone tree-in-bud nodularity. As mentioned previously findings are nonspecific and may be inflammatory or infectious in etiology. Metastatic disease is not excluded. 3. Aortic atherosclerosis. 4. Right lobe of thyroid gland nodule. Consider further evaluation with thyroid ultrasound. If patient is clinically hyperthyroid, consider nuclear medicine thyroid uptake and scan.    PHYSICAL EXAMINATION:  Vitals:   01/01/20 2133 01/02/20 0508  BP: 108/89 102/89  Pulse: 89 98  Resp: 18 18  Temp: (!) 97.4 F (36.3 C) 97.7 F (36.5 C)  SpO2: 99% 100%   Filed Weights   12/24/19 1440  Weight: 87 lb 4.8 oz (39.6 kg)    Intake/Output from previous day: 09/28 0701 - 09/29 0700 In: 3206.1 [I.V.:1695.5; IV Piggyback:1510.6] Out: 900 [Urine:900]  GENERAL: Alert, cachectic, no distress; minimal verbal interaction HEENT: Mild scleral icterus.  Mild thrush.  No bleeding. SKIN: No petechiae at the lower legs. LUNGS: Distant breath sounds.  No respiratory distress. HEART:  regular rate & rhythm  ABDOMEN: Mild generalized tenderness. Vascular: Pitting edema at the lower legs bilaterally. NEURO: Follows commands Extremities: Right upper extremity edema beginning at the elbow and extending upward.  LABORATORY DATA:  I have reviewed the data as listed CMP Latest Ref Rng & Units 01/02/2020 01/01/2020 12/31/2019  Glucose 70 - 99 mg/dL 102(H) 83 89  BUN 6 - 20 mg/dL 23(H) 22(H) 21(H)  Creatinine 0.61 - 1.24 mg/dL <0.30(L) 0.34(L) 0.30(L)  Sodium 135 - 145 mmol/L 138 137 136  Potassium 3.5 - 5.1 mmol/L 3.9 3.9 3.7  Chloride 98 - 111 mmol/L 107 107 104  CO2 22 - 32 mmol/L 26 26 26   Calcium 8.9 - 10.3 mg/dL 7.5(L) 7.4(L) 7.5(L)  Total Protein 6.5 - 8.1 g/dL 5.2(L) 4.9(L) 5.0(L)  Total Bilirubin 0.3 - 1.2 mg/dL 2.8(H) 2.4(H) 3.3(H)  Alkaline Phos 38 - 126 U/L 433(H) 425(H) 408(H)  AST 15 - 41 U/L 102(H) 156(H) 76(H)  ALT 0 - 44 U/L 82(H) 83(H) 61(H)    Lab Results  Component Value Date   WBC 15.0 (H) 01/02/2020   HGB 10.6 (L) 01/02/2020   HCT 31.5 (L) 01/02/2020   MCV 81.0 01/02/2020   PLT 13 (LL) 01/02/2020   NEUTROABS 13.8 (H) 01/02/2020    DG Chest 2 View  Result Date: 12/30/2019 CLINICAL DATA:  Pancreatic cancer with right-sided pleural effusion. EXAM: CHEST - 2 VIEW COMPARISON:  December 27, 2019 FINDINGS: Injectable port in stable position. Cardiomediastinal silhouette is normal. Mediastinal contours appear intact. Stable right pleural effusion. Multiple nodular opacities, predominantly in the lung bases persist. Osseous structures are without acute abnormality. Soft tissues are grossly normal. IMPRESSION: Stable moderate right  pleural effusion. Stable appearance of multiple nodular opacities throughout both lungs. Electronically Signed   By: Fidela Salisbury M.D.   On: 12/30/2019 10:56   CT Head Wo Contrast  Result Date: 12/27/2019 CLINICAL DATA:  Pancreatic cancer, chemotherapy, weakness, headache EXAM: CT HEAD WITHOUT CONTRAST TECHNIQUE:  Contiguous axial images were obtained from the base of the skull through the vertex without intravenous contrast. COMPARISON:  None. FINDINGS: Brain: No evidence of acute infarction, hemorrhage, hydrocephalus, extra-axial collection or mass lesion/mass effect. Vascular: No hyperdense vessel or unexpected calcification. Skull: Normal. Negative for fracture or focal lesion. Sinuses/Orbits: No acute finding. Other: None. IMPRESSION: No acute intracranial pathology. No non-contrast CT evidence of intracranial metastatic disease or findings to explain headache. Contrast enhanced MRI may be used to more sensitively evaluate for intracranial metastatic disease if desired. Electronically Signed   By: Eddie Candle M.D.   On: 12/09/2019 11:30   CT Angio Chest PE W and/or Wo Contrast  Result Date: 12/16/2019 CLINICAL DATA:  Shortness of breath and chest pain. Suspect pulmonary embolism. History of pancreatic cancer on chemotherapy. EXAM: CT ANGIOGRAPHY CHEST WITH CONTRAST TECHNIQUE: Multidetector CT imaging of the chest was performed using the standard protocol during bolus administration of intravenous contrast. Multiplanar CT image reconstructions and MIPs were obtained to evaluate the vascular anatomy. CONTRAST:  143mL OMNIPAQUE IOHEXOL 350 MG/ML SOLN COMPARISON:  08/29/2019 FINDINGS: Cardiovascular: Right IJ central venous catheter is present with tip over the cavoatrial junction. Heart is normal size. Thoracic aorta is normal caliber. Pulmonary arterial system is well opacified and demonstrates a single small embolus over a right lower lobar subsegmental pulmonary artery. No left-sided pulmonary emboli. Mediastinum/Nodes: No evidence of mediastinal or hilar adenopathy. Remaining mediastinal structures are unremarkable. Lungs/Pleura: Lungs are adequately inflated demonstrate a moderate size right pleural effusion with associated atelectasis in the right base. There are numerous small bilateral pulmonary nodules with  interval progression likely worsening metastatic disease. Airways are unremarkable. Upper Abdomen: Not well evaluated on this arterial phase CT scan. Suggestion of partial visualization of patient's known biliary stent. Moderate pneumobilia is present. Couple low-density foci over the right lobe of the liver with air-fluid levels of uncertain clinical significance. Musculoskeletal: No focal abnormality. Review of the MIP images confirms the above findings. IMPRESSION: 1. Single small embolus over a right lower lobar subsegmental pulmonary artery. 2. Moderate size right pleural effusion with associated right basilar atelectasis. 3. Interval progression of multiple pulmonary nodules likely metastatic disease due to patient's known pancreatic cancer. 4. Couple indeterminate low-density foci over the right lobe of the liver with air-fluid levels of not well evaluated on this arterial phase contrast CT. Pneumobilia and partially visualized biliary stent. Recommend clinical correlation as further evaluation with venous phase contrast enhanced CT of the abdomen/pelvis would be helpful. Critical Value/emergent results were called by telephone at the time of interpretation on 12/27/2019 at 3:08 pm to provider [facility] RAY , who verbally acknowledged these results. Electronically Signed   By: Marin Olp M.D.   On: 01/02/2020 15:10   CT ABDOMEN PELVIS W CONTRAST  Result Date: 12/31/2019 CLINICAL DATA:  Adnexal past.  History of pancreatic cancer. EXAM: CT ABDOMEN AND PELVIS WITH CONTRAST TECHNIQUE: Multidetector CT imaging of the abdomen and pelvis was performed using the standard protocol following bolus administration of intravenous contrast. CONTRAST:  124mL OMNIPAQUE IOHEXOL 300 MG/ML  SOLN COMPARISON:  December 25, 2019. FINDINGS: Lower chest: Moderate bilateral pleural effusions are noted with multiple nodule seen involving the visualized lung bases consistent with  metastatic disease. Hepatobiliary: Continued  presence of stent within common bile duct. There has been placement of a nother stent within the common bile duct that extends from cavitary abnormality in upper pole of right kidney and through common bile duct into the duodenum. No gallstones are noted. Extensive pneumobilia is again noted. There remains multiple rounded low densities some of which with air-fluid levels, concerning for hepatic abscesses. The largest measures 4.3 cm inferiorly in the right hepatic lobe. Pancreas: There is again noted pancreatic head mass which is poorly defined. It appears to surround portions of the celiac and superior mesenteric arteries. It is difficult to determine the size given its ill-defined and infiltrative nature. Spleen: Normal in size without focal abnormality. Adrenals/Urinary Tract: Adrenal glands and kidneys appear normal. No hydronephrosis or renal obstruction is noted. Moderate urinary bladder distention is noted. Stomach/Bowel: Stomach is within normal limits. Appendix appears normal. No evidence of bowel wall thickening, distention, or inflammatory changes. Vascular/Lymphatic: Atherosclerosis of abdominal aorta is noted without aneurysm or dissection. As noted above, there appears to be extension of the pancreatic mass around the vasculature of the proximal abdominal aorta and celiac and superior mesenteric branches. No discrete adenopathy is noted. 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 Reproductive: Prostate is unremarkable. Other: Significantly increased anasarca is noted. Mildly increased ascites is noted as well. Musculoskeletal: No acute or significant osseous findings. IMPRESSION: 1. 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 Significantly increased ascites and anasarca is noted compared to prior exam. These results will be called to the ordering clinician or representative by the Radiologist Assistant, and  communication documented in the PACS or zVision Dashboard. 2. Moderate bilateral pleural effusions are noted with multiple nodules seen involving the visualized lung bases consistent with metastatic disease. 3. Continued presence of stent within common bile duct. There has been placement of another stent within the common bile duct that extends from cavitary abnormality in upper pole of right kidney and through common bile duct into the duodenum. Extensive pneumobilia is again noted. 4. Multiple rounded low densities are noted some of which with air-fluid levels, concerning for hepatic abscesses. The largest measures 4.3 cm inferiorly in the right hepatic lobe. 5. There is again noted pancreatic head mass which is poorly defined. It is difficult to determine the size given its ill-defined and infiltrative nature. It appears to surround portions of the celiac and superior mesenteric arteries. 6. Moderate urinary bladder distention is noted. 7. Aortic atherosclerosis. Aortic Atherosclerosis (ICD10-I70.0). Electronically Signed   By: Marijo Conception M.D.   On: 12/31/2019 15:58   CT ABDOMEN PELVIS W CONTRAST  Result Date: 12/25/2019 CLINICAL DATA:  39 year old male with history of abdominal pain. Suspected abscess. EXAM: CT ABDOMEN AND PELVIS WITH CONTRAST TECHNIQUE: Multidetector CT imaging of the abdomen and pelvis was performed using the standard protocol following bolus administration of intravenous contrast. CONTRAST:  39mL OMNIPAQUE IOHEXOL 300 MG/ML  SOLN COMPARISON:  CT the abdomen and pelvis 08/29/2019. FINDINGS: Lower chest: Moderate right and small left pleural effusions lying dependently. Some pleural enhancement and thickening in the lower right hemithorax suggestive of malignant pleural involvement. Numerous pulmonary nodules scattered throughout the visualize lung bases, including areas of confluent nodularity in the lower lobes of the lungs bilaterally, most compatible with progressive metastatic  disease. Hepatobiliary: Compared to the prior study there are numerous low-attenuation lesions within the liver, highly suspicious for multifocal  intrahepatic abscesses (less likely to represent centrally necrotic metastatic lesions). The largest of these has internal septations and measures up to 3.6 x 3.5 cm in segment 5 (axial image 30 of series 2). One of these lesions in segment 7/8 of the liver has some non dependent gas, strongly favoring abscess. Extensive intrahepatic biliary ductal dilatation with a large volume of pneumobilia. Gallbladder is unremarkable in appearance. Interval placement of common bile duct stent which appears appropriately located. Pancreas: Poorly defined pancreatic mass in the superior aspect of the head of the pancreas (soft tissues in the superior aspect of the head of the pancreas are somewhat indistinct, potentially treatment related). Haziness in the peripancreatic fat, including soft tissue around portions of the celiac axis, common hepatic artery and proximal superior mesenteric artery, as well as the left renal vein, all of which appear narrowed, likely indicative of locally infiltrative tumor. This lesion is difficult to definitively identify and measure, but is estimated to measure approximately 4.4 x 3.0 cm (axial image 27 of series 2). Spleen: The appearance of the spleen is unremarkable. Adrenals/Urinary Tract: Bilateral kidneys and adrenal glands are normal in appearance. No hydroureteronephrosis. Urinary bladder is unremarkable in appearance. Stomach/Bowel: The appearance of the stomach is unremarkable. No pathologic dilatation of small bowel or colon. Portions of the colon appear thickened with some mucosal hyperenhancement (poorly evaluated given the adjacent ascites), best appreciated in the region of the cecum, ascending colon and hepatic flexure, suggestive of underlying colitis. Appendix is not confidently identified. Vascular/Lymphatic: Aortic atherosclerosis,  without evidence of aneurysm or dissection in the abdominal or pelvic vasculature. Vascular involvement from the pancreatic mass, discussed above. Poorly defined infiltrative soft tissue throughout the retroperitoneum adjacent to the pancreatic mass, with soft tissue completely encircling the upper abdominal aorta immediately below the level of the renal arteries (axial image 30 of series 2), presumably indicative of lymphatic spread of disease. Reproductive: Prostate gland and seminal vesicles are unremarkable in appearance. Other: Large volume of ascites. No pneumoperitoneum. Diffuse body wall edema. Musculoskeletal: There are no aggressive appearing lytic or blastic lesions noted in the visualized portions of the skeleton. IMPRESSION: 1. Today's study demonstrates what appears to be progression of disease with innumerable metastatic lesions throughout the visualize lung bases, probable malignant moderate right pleural effusion and small left pleural effusion, enlargement of the primary pancreatic mass now with vascular involvement, and what appears to be evolving retroperitoneal lymphadenopathy encasing the abdominal aorta. 2. In addition, there has been interval placement of a common bile duct stent, now with substantial intrahepatic biliary ductal dilatation, large amount of pneumatosis, and multiple new low-attenuation liver lesions, some of which have internal gas fluid levels, highly concerning for multiple intra-abdominal abscesses and probable cholangitis. 3. Profound mural thickening and mucosal hyperenhancement in portions of the colon (predominantly cecum, ascending colon and hepatic flexure), concerning for colitis. 4. Aortic atherosclerosis. 5. Additional incidental findings, as above. Electronically Signed   By: Vinnie Langton M.D.   On: 12/25/2019 14:50   DG CHEST PORT 1 VIEW  Result Date: 12/31/2019 CLINICAL DATA:  History of metastatic pancreatic cancer. History of cholangitis and hepatic  abscesses. Pleural effusion. EXAM: PORTABLE CHEST 1 VIEW COMPARISON:  12/30/2019. FINDINGS: PowerPort catheter stable position. Heart size normal. Multiple nodular opacities are again noted throughout both lungs. Persistent bibasilar atelectasis and or infiltrates. Moderate right pleural effusion slightly increased from prior exam noted. No pneumothorax. Biliary stent right upper quadrant again noted. IMPRESSION: 1. Moderate right pleural effusion, slightly increased from prior exam.  2. Multiple nodular opacities again noted throughout both lungs. Persistent bibasilar atelectasis and or infiltrates. Electronically Signed   By: Marcello Moores  Register   On: 12/31/2019 10:48   DG Chest Port 1 View  Result Date: 12/23/2019 CLINICAL DATA:  39 year old male, status post thoracentesis EXAM: PORTABLE CHEST 1 VIEW COMPARISON:  Chest CT 12/22/2019, chest x-ray 12/23/2018 FINDINGS: Cardiomediastinal silhouette unchanged in size and contour. Right IJ port catheter unchanged. Micro nodular pattern of opacity throughout the lungs, compatible with findings on the prior CT. Blunting of the right costophrenic angle persists, with no pneumothorax. No left-sided pleural fluid.  No new confluent airspace disease. IMPRESSION: No complicating features status post right-sided thoracentesis, with persistent small fluid/atelectasis at the right lung base. Micro nodular pattern of opacity of the bilateral lungs compatible with findings on the recent prior CT chest. Unchanged right IJ port catheter. Electronically Signed   By: Corrie Mckusick D.O.   On: 01/03/2020 16:04   DG Chest Port 1 View  Result Date: 12/16/2019 CLINICAL DATA:  Pancreatic cancer, weakness EXAM: PORTABLE CHEST 1 VIEW COMPARISON:  08/29/2019 chest CT. FINDINGS: Right internal jugular Port-A-Cath terminates at the cavoatrial junction. Normal heart size. Normal mediastinal contour. No pneumothorax. New small right pleural effusion. No left pleural effusion. No pulmonary  edema. Hazy patchy right lung base opacity. Indistinct tiny nodular opacities scattered in the lower lungs bilaterally. IMPRESSION: 1. New small right pleural effusion. 2. Hazy patchy right lung base opacity, favor atelectasis. 3. Indistinct tiny nodular opacities scattered in the lower lungs bilaterally, as seen on prior chest CT, cannot exclude metastatic disease. Electronically Signed   By: Ilona Sorrel M.D.   On: 12/16/2019 11:40   DG ERCP  Result Date: 01/01/2020 CLINICAL DATA:  Pancreas cancer, cholangitis, sepsis, stent occlusion EXAM: ERCP with common bile duct stent revision TECHNIQUE: Multiple spot images obtained with the fluoroscopic device and submitted for interpretation post-procedure. FLUOROSCOPY TIME:  Fluoroscopy Time:  10 minutes 37 seconds Radiation Exposure Index (if provided by the fluoroscopic device): Dense 110 mGy Number of Acquired Spot Images: Numerous spot fluoroscopic view COMPARISON:  12/25/2019 FINDINGS: Spot fluoroscopic views during ERCP confirm wire and catheter traversing the biliary metallic stent. Proximal contrast injection above the stent confirms biliary obstruction from stent occlusion. Endoscopic plastic biliary stent now traverses the previous metallic stent. 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 Successful plastic endoscopic stent placement traversing the proximal biliary tree through the CBD metallic stent. These images were submitted for radiologic interpretation only. Please see the procedural report for the amount of contrast and the fluoroscopy time utilized. Electronically Signed   By: Jerilynn Mages.  Shick M.D.   On: 12/18/2019 13:05   US THORACENTESIS ASP PLEURAL SPACE W/IMG GUIDE  Result Date: 12/15/2019 INDICATION: Patient with history of pancreatic cancer, failure to thrive, and right pleural effusion. Request made for diagnostic and therapeutic right thoracentesis EXAM: ULTRASOUND GUIDED DIAGNOSTIC AND THERAPEUTIC RIGHT  THORACENTESIS MEDICATIONS: 8 mL 1% lidocaine COMPLICATIONS: None immediate. PROCEDURE: An ultrasound guided thoracentesis was thoroughly discussed with the patient and questions answered. The benefits, risks, alternatives and complications were also discussed. The patient understands and wishes to proceed with the procedure. Written consent was obtained. Ultrasound was performed to localize and mark an adequate pocket of fluid in the right chest. The area was then prepped and draped in the normal sterile fashion. 1% Lidocaine was used for local anesthesia. Under ultrasound guidance a 6 Fr Safe-T-Centesis catheter was introduced. Thoracentesis was performed. The catheter was removed  and a dressing applied. FINDINGS: A total of approximately 850 mL of dark red fluid was removed. Samples were sent to the laboratory as requested by the clinical team. IMPRESSION: Successful ultrasound guided right thoracentesis yielding 850 mL of pleural fluid. Read by: Earley Abide, PA-C Electronically Signed   By: Corrie Mckusick D.O.   On: 12/21/2019 16:00    ASSESSMENT AND PLAN: 1. Pancreas cancer  11/29/2018 CT abdomen/pelvis without contrast-appendix not well visualized, limited evaluation due to lack of enteric contrast and perineal fat  12/15/2018 CT abdomen/pelvis with contrast-possible 1.5 cm mass within the mid aspect of the pancreatic body with associated atrophy of the upstream pancreatic tail and associated pancreatic ductal dilatation. Soft tissue fullness around the superior mesenteric artery and celiac axis. Tree-in-bud nodularity within the peripheral aspect of the lower lobes bilaterally.  12/28/2018 upper EUS-2.4 cm x 1.9 cm mass in the body of the pancreas causing main pancreatic duct obstruction and clearly involving the celiac trunk. Heterogeneous soft tissue mass in the neck measuring 3.6 cm. Fine-needle aspiration pancreas with malignant cells consistent with adenocarcinoma.  01/16/2019 Port-A-Cath  placement  01/17/2019 PET scan-suspected enlargement of the pancreatic mass with abnormal hypermetabolic activity at the junction of the pancreatic body and tail measuring 3.4 x 2.9 x 2.4 cm, thought to be a high likelihood tumor extension around the vicinity of the celiac trunk and possibly the superior mesenteric artery. Potential mild left periaortic adenopathy maximum SUV of the indistinct left periaortic lymph nodes approximately 3.3 which is above blood pool levels. Scattered groundglass density nodules in the lungs favoring the upper lobes with tree-in-bud nodularity in the lung bases favoring the lower lobes. None of the lesions are hypermetabolic.  Gemcitabine/Abraxane 01/24/2019-08/22/2019  04/16/2019 CT chest/abdomen/pelvis-margins of pancreatic neoplasm difficult to identify. Similar appearance of soft tissue infiltration within the retroperitoneum with progressive narrowing of the portal venous confluence by tumor. No specific findings of solid organ metastasis or nodal metastasis within the abdomen or pelvis. Unchanged appearance of multifocal subsolid nodules within the upper lung zones and bilateral lower lung zone tree-in-bud nodularity. Right lobe of thyroid gland nodule.  This  CT 08/29/2019-development of intra and extrahepatic biliary duct dilatation with abrupt cut off at the common bile duct, abnormal soft tissue in the retroperitoneum attenuates the portal vein and the splenic vein is occluded, fullness in the head of the pancreas appears more prominent without discrete margins, subtle 12 mm focus in the medial left liver   MRI liver 09/20/2019-heterogeneous hepatic hyperenhancement favored to represent perfusion anomalies in the setting of portal vein narrowing.  No convincing evidence of hepatic metastasis.  Locally advanced pancreatic carcinoma suboptimally evaluated.  Persistent mild intrahepatic biliary duct dilatation.  Subtle peripancreatic edema.  CT chest  12/18/2019-single small embolus over a right lower lobar subsegmental pulmonary artery.  Moderate sized right pleural effusion with associated right basilar atelectasis.  Interval progression of multiple pulmonary nodules likely metastatic disease.  Indeterminate low-density foci over the right lobe of the liver with air-fluid levels.  Pneumobilia and partially visualized biliary stent.  CT abdomen/pelvis 12/25/2019-moderate right and small left pleural effusions.  Some pleural enhancement and thickening in the lower right hemithorax suggestive of malignant pleural involvement.  Numerous pulmonary nodules scattered throughout the visualized lung bases most compatible with progressive metastatic disease. Interval placement of a common bile duct stent, now with substantial intrahepatic biliary ductal dilatation, large amount of pneumatosis and multiple new low-attenuation liver lesions some of which have internal gas fluid levels highly concerning for  multiple intra-abdominal abscesses and probable cholangitis.  Profound mural thickening and mucosal hyperenhancement in portions of the colon concerning for colitis.  Malignant right pleural effusion 12/17/2019 2. Abdominal/back pain secondary to #1 3. Port-A-Cath placement 01/16/2019 4. PE on chest CT 01/01/2020.  Anticoagulation on hold due to severe thrombocytopenia. 5. Right pleural effusion on chest CT 12/08/2019-thoracentesis 12/13/2019, cytology with malignant cells consistent with metastatic adenocarcinoma 6. Admission with sepsis syndrome secondary to biliary obstruction secondary by #1-status post ERCP with placement of a new bile duct stent 12/31/2019 7. Multiple liver lesions concerning for abscesses on CT 12/25/2019 8. Urine culture 12/22/2019 + for [facility]  Mr. Biller appears unchanged.  We again discussed the recommendation for supportive/comfort care, hospice referral. He would like to discuss this further with his wife.   CBC from this morning  reviewed.  Platelet count stable at 13,000.  There is no active bleeding.      Recommendations: 1.  Transition of care consult has been placed to initiate referral to hospice.   2.  Hold off on platelet transfusion unless active bleeding or prior to any procedure. 3.  Continue to hold anticoagulation and filter placement for now.  He can be considered for Lovenox if his platelets rise. 4.  Continue narcotic analgesics for pain.  He reports current regimen is effective for pain. 5.  Continue discussions regarding CODE STATUS via the palliative care and hospice teams    LOS: 10 days   Ned Card, AGPCNP-BC 01/02/20  Mr. Sisemore was interviewed and examined.  His mother was at the bedside.  He appears unchanged.  He has persistent severe thrombocytopenia, likely related to infection.  I recommend home hospice care.  I would begin intensive management of the thrombocytopenia only if there is a plan to proceed with drainage of hepatic abscesses.  We recommend discontinuing blood draws if he is in agreement with hospice care.

## 2020-01-02 NOTE — Progress Notes (Signed)
PROGRESS NOTE  Gabriel [name] FAO:[id number] DOB: 1980-08-28   PCP: Kerin Perna, NP  Patient is from: Home  DOA: 12/27/2019 LOS: 53  Brief Narrative / Interim history: 39 year old male with history of pancreatic adenocarcinoma with SMA involvement s/p palliative chemotherapy with gemcitabine and Abraxane, biliary obstruction status post biliary stent in 09/2019 presenting with decreased appetite, generalized weakness and altered mental status, and admitted for sepsis in the setting of Klebsiella UTI and hepatic abscess, and moderate right-sided PE.  Per IR, patient is not a great candidate for PERC drain placement.  He was started on IV meropenem and transition to Cipro and Flagyl for a total of 14 days oncology recommended hospice.  PMT consulted and following.  See individual problem list below for more on hospital course.  Subjective: Seen and examined earlier this morning.  No major events overnight of this morning.  No complaints.  He denies pain but not quite forthcoming.  Patient's mother at bedside.  Per patient's mother, patient's wife is planning to take patient home but won't be here until later today to discuss plan.   Objective: Vitals:   01/01/20 1044 01/01/20 1255 01/01/20 2133 01/02/20 0508  BP:  (!) 116/95 108/89 102/89  Pulse: 62 65 89 98  Resp:  16 18 18   Temp:  (!) 97.3 F (36.3 C) (!) 97.4 F (36.3 C) 97.7 F (36.5 C)  TempSrc:   Oral Oral  SpO2:  100% 99% 100%  Weight:      Height:        Intake/Output Summary (Last 24 hours) at 01/02/2020 1433 Last data filed at 01/02/2020 1425 Gross per 24 hour  Intake 3587.98 ml  Output --  Net 3587.98 ml   Filed Weights   12/24/19 1440  Weight: 39.6 kg    Examination:  GENERAL: Chronically ill-appearing.  Nontoxic. HEENT: MMM.  Vision and hearing grossly intact.  NECK: Supple.  No apparent JVD.  RESP: On room air.  No IWOB.  Fair aeration bilaterally. CVS:  RRR. Heart sounds normal.  ABD/GI/GU: BS+.  Abd soft, NTND.  MSK/EXT:  Moves extremities.  Significant muscle mass and subcu fat loss.  Swelling in RLE from IV infiltration SKIN: no apparent skin lesion or wound NEURO: Awake, alert and oriented fairly.  No apparent focal neuro deficit. PSYCH: Calm.  Flat affect.  Procedures:  None  Microbiology summarized: COVID-19 PCR negative. Urine culture with 80,000 colonies of Klebsiella resistant to ampicillin  Assessment & Plan: Sepsis due to hepatic abscess/cholangitis and Klebsiella UTI  Biliary obstruction s/p biliary stent:  -ERCP on 9/23 with near complete blockage of previously placed metallic stent.  Plastic biliary stent placed.  -Per discussion between previous attending and IR, not a candidate for PERC drain placement -IV meropenem>> Cipro and Flagyl on 9/24>> I doubt this would change the outcome.  Metastatic pancreatic cancer s/p palliative chemo with gemcitabine and Abraxane.  Multiple nodules noted on CT abdomen and pelvis. -Oncology recommended hospice.  TOC consulted for home hospice -Pain control per oncology and palliative care  Acute small subsegmental PE Portal vein and splenic vein thrombosis -Not a candidate for anticoagulation due to severe thrombocytopenia -Oncology recommended holding off IVC filter placement  Ascites/anasarca Moderate bilateral pleural effusion -Likely due to severe malnutrition and malignancy  Moderate right pleural effusion-likely due to severe malnutrition and malignancy -Status post thoracocentesis on 9/23  Severe thrombocytopenia: Likely consumptive.  DIC?  No obvious signs of bleeding. -Holding off platelet transfusion unless active bleeding -Hold off anticoagulation  SVT the night of 9/26.  Started on Toprol-XL which was held due to bradycardia  GOC: Patient with the above comorbidities.  Poor prognosis likely days to weeks.  He is still full code which I believe would pose more harm than benefit.  Per previous attending note,  patient's wife likes to take patient home for his last few days.  -Palliative medicine following.  Severe malnutrition as evidenced by significant muscle mass and subcu fat loss Body mass index is 13.27 kg/m. Nutrition Problem: Severe Malnutrition Etiology: chronic illness, cancer and cancer related treatments Signs/Symptoms: severe fat depletion, severe muscle depletion Interventions: Boost Breeze, MVI, Other (Comment) [employer] 1.4 po)   Sacral decubitus ulcer: Stage II.  POA Pressure Injury 12/24/19 Sacrum Mid Stage 2 -  Partial thickness loss of dermis presenting as a shallow open injury with a red, pink wound bed without slough. (Active)  12/24/19 1430  Location: Sacrum  Location Orientation: Mid  Staging: Stage 2 -  Partial thickness loss of dermis presenting as a shallow open injury with a red, pink wound bed without slough.  Wound Description (Comments):   Present on Admission: Yes   DVT prophylaxis:  SCDs Start: 12/06/2019 1729  Code Status: Full code Family Communication: Discussed with patient's mother at bedside. Status is: Inpatient  Remains inpatient appropriate because:Unsafe d/c plan, IV treatments appropriate due to intensity of illness or inability to take PO and Inpatient level of care appropriate due to severity of illness   Dispo:  Patient From: Home  Planned Disposition: Home Hospice  Expected discharge date: 01/02/20  Medically stable for discharge: No        Consultants:  Oncology Palliative medicine Interventional radiology   Sch Meds:  Scheduled Meds:  sodium chloride   Intravenous Once   sodium chloride   Intravenous Once   Chlorhexidine Gluconate Cloth  6 each Topical Daily   feeding supplement  1 Container Oral BID BM   feeding supplement (KATE FARMS STANDARD 1.4)  325 mL Oral Daily   indomethacin  100 mg Rectal Once   metoprolol succinate  12.5 mg Oral Daily   morphine  60 mg Oral Q12H   multivitamin with minerals  1  tablet Oral Daily   nystatin  5 mL Oral QID   sodium chloride flush  3 mL Intravenous Q12H   Continuous Infusions:  ciprofloxacin 400 mg (01/02/20 1325)   dextrose 5 % and 0.9% NaCl 100 mL/hr at 01/02/20 0137   metronidazole 500 mg (01/02/20 1144)   PRN Meds:.morphine, morphine injection, sodium chloride flush  Antimicrobials: Anti-infectives (From admission, onward)   Start     Dose/Rate Route Frequency Ordered Stop   12/28/19 1400  ciprofloxacin (CIPRO) IVPB 400 mg        400 mg 200 mL/hr over 60 Minutes Intravenous Every 12 hours 12/28/19 0910     12/28/19 1200  metroNIDAZOLE (FLAGYL) IVPB 500 mg        500 mg 100 mL/hr over 60 Minutes Intravenous Every 8 hours 12/28/19 0910     12/22/2019 2200  meropenem (MERREM) 1 g in sodium chloride 0.9 % 100 mL IVPB  Status:  Discontinued        1 g 200 mL/hr over 30 Minutes Intravenous Every 8 hours 12/26/2019 1730 12/28/19 0908   01/02/2020 2200  meropenem (MERREM) 1 g in sodium chloride 0.9 % 100 mL IVPB  Status:  Discontinued        1 g 200 mL/hr over 30 Minutes Intravenous Every  8 hours 12/10/2019 1706 12/22/2019 1730   12/11/2019 1730  meropenem (MERREM) 1 g in sodium chloride 0.9 % 100 mL IVPB  Status:  Discontinued        1 g 200 mL/hr over 30 Minutes Intravenous  Once 12/17/2019 1728 12/08/2019 1731   12/22/2019 1230  aztreonam (AZACTAM) 2 g in sodium chloride 0.9 % 100 mL IVPB        2 g 200 mL/hr over 30 Minutes Intravenous  Once 12/29/2019 1229 12/15/2019 1527   12/10/2019 1230  metroNIDAZOLE (FLAGYL) IVPB 500 mg        500 mg 100 mL/hr over 60 Minutes Intravenous  Once 12/20/2019 1229 12/22/2019 1400   12/24/2019 1230  vancomycin (VANCOCIN) IVPB 1000 mg/200 mL premix        1,000 mg 200 mL/hr over 60 Minutes Intravenous  Once 12/11/2019 1229 12/25/2019 1300       I have personally reviewed the following labs and images: CBC: Recent Labs  Lab 12/21/2019 0345 12/28/19 0722 12/29/19 0409 12/30/19 0451 12/31/19 0237 01/01/20 0411 01/02/20 0422    WBC 8.3   < > 7.7 8.0 18.8* 16.4* 15.0*  NEUTROABS 7.4  --   --  7.0 18.6* 15.4* 13.8*  HGB 10.9*   < > 9.6* 9.7* 10.1* 9.9* 10.6*  HCT 32.4*   < > 28.8* 29.4* 30.2* 29.5* 31.5*  MCV 83.7   < > 83.0 84.0 83.7 82.4 81.0  PLT 43*   < > 91* 102* 51* 11* 13*   < > = values in this interval not displayed.   BMP &GFR Recent Labs  Lab 12/29/19 0912 12/30/19 0451 12/31/19 0237 01/01/20 0411 01/02/20 0422  NA 138 136 136 137 138  K 3.9 3.9 3.7 3.9 3.9  CL 103 105 104 107 107  CO2 30 29 26 26 26   GLUCOSE 77 79 89 83 102*  BUN 25* 21* 21* 22* 23*  CREATININE 0.30* <0.30* 0.30* 0.34* <0.30*  CALCIUM 7.8* 7.5* 7.5* 7.4* 7.5*  MG  --   --   --  1.7  --    CrCl cannot be calculated (This lab value cannot be used to calculate CrCl because it is not a number: <0.30). Liver & Pancreas: Recent Labs  Lab 12/29/19 0912 12/30/19 0451 12/31/19 0237 01/01/20 0411 01/02/20 0422  AST 42* 37 76* 156* 102*  ALT 54* 53* 61* 83* 82*  ALKPHOS 424* 376* 408* 425* 433*  BILITOT 2.5* 2.0* 3.3* 2.4* 2.8*  PROT 5.6* 5.2* 5.0* 4.9* 5.2*  ALBUMIN 2.0* 1.9* 1.8* 1.7* 1.7*   Recent Labs  Lab 12/28/19 0722  LIPASE 24   No results for input(s): AMMONIA in the last 168 hours. Diabetic: No results for input(s): HGBA1C in the last 72 hours. No results for input(s): GLUCAP in the last 168 hours. Cardiac Enzymes: No results for input(s): CKTOTAL, CKMB, CKMBINDEX, TROPONINI in the last 168 hours. No results for input(s): PROBNP in the last 8760 hours. Coagulation Profile: Recent Labs  Lab 12/06/2019 0345  INR 1.5*   Thyroid Function Tests: No results for input(s): TSH, T4TOTAL, FREET4, T3FREE, THYROIDAB in the last 72 hours. Lipid Profile: No results for input(s): CHOL, HDL, LDLCALC, TRIG, CHOLHDL, LDLDIRECT in the last 72 hours. Anemia Panel: No results for input(s): VITAMINB12, FOLATE, FERRITIN, TIBC, IRON, RETICCTPCT in the last 72 hours. Urine analysis:    Component Value Date/Time    COLORURINE AMBER (A) 12/15/2019 1103   APPEARANCEUR CLEAR 12/17/2019 1103   LABSPEC 1.017 12/20/2019  [place] 5.0 12/14/2019 1103   GLUCOSEU NEGATIVE 12/08/2019 1103   HGBUR MODERATE (A) 12/14/2019 [place] 12/07/2019 [place] 12/15/2019 1103   PROTEINUR NEGATIVE 12/19/2019 1103   NITRITE NEGATIVE 12/17/2019 1103   LEUKOCYTESUR SMALL (A) 12/25/2019 1103   Sepsis Labs: Invalid input(s): PROCALCITONIN, [place]  Microbiology: No results found for this or any previous visit (from the past 240 hour(s)).  Radiology Studies: No results found.    Jerome Otter T. [place]  If 7PM-7AM, please contact night-coverage [email or web address] 01/02/2020, 2:33 PM

## 2020-01-02 NOTE — Progress Notes (Signed)
PT Cancellation Note  Patient Details Name: Gabriel [name] MRN: [id number] DOB: 08-07-1980   Cancelled Treatment:    Reason Eval/Treat Not Completed: Will sign off. Plan is for home with hospice with focus on comfort care.    [facility]  Office: [phone] Pager: [phone]

## 2020-01-03 LAB — COMPREHENSIVE METABOLIC PANEL
ALT: 65 U/L — ABNORMAL HIGH (ref 0–44)
AST: 73 U/L — ABNORMAL HIGH (ref 15–41)
Albumin: 1.5 g/dL — ABNORMAL LOW (ref 3.5–5.0)
Alkaline Phosphatase: 414 U/L — ABNORMAL HIGH (ref 38–126)
Anion gap: 5 (ref 5–15)
BUN: 23 mg/dL — ABNORMAL HIGH (ref 6–20)
CO2: 24 mmol/L (ref 22–32)
Calcium: 7.1 mg/dL — ABNORMAL LOW (ref 8.9–10.3)
Chloride: 108 mmol/L (ref 98–111)
Creatinine, Ser: <0.3 mg/dL — ABNORMAL LOW (ref 0.61–1.24)
Glucose, Bld: 103 mg/dL — ABNORMAL HIGH (ref 70–99)
Potassium: 3.7 mmol/L (ref 3.5–5.1)
Sodium: 137 mmol/L (ref 135–145)
Total Bilirubin: 2.5 mg/dL — ABNORMAL HIGH (ref 0.3–1.2)
Total Protein: 4.4 g/dL — ABNORMAL LOW (ref 6.5–8.1)

## 2020-01-03 LAB — CBC WITH DIFFERENTIAL/PLATELET
Abs Immature Granulocytes: 0.09 10*3/uL — ABNORMAL HIGH (ref 0.00–0.07)
Basophils Absolute: 0 10*3/uL (ref 0.0–0.1)
Basophils Relative: 0 %
Eosinophils Absolute: 0 10*3/uL (ref 0.0–0.5)
Eosinophils Relative: 0 %
HCT: 26.8 % — ABNORMAL LOW (ref 39.0–52.0)
Hemoglobin: 8.9 g/dL — ABNORMAL LOW (ref 13.0–17.0)
Immature Granulocytes: 1 %
Lymphocytes Relative: 4 %
Lymphs Abs: 0.6 10*3/uL — ABNORMAL LOW (ref 0.7–4.0)
MCH: 27.1 pg (ref 26.0–34.0)
MCHC: 33.2 g/dL (ref 30.0–36.0)
MCV: 81.7 fL (ref 80.0–100.0)
Monocytes Absolute: 0.3 10*3/uL (ref 0.1–1.0)
Monocytes Relative: 2 %
Neutro Abs: 12.7 10*3/uL — ABNORMAL HIGH (ref 1.7–7.7)
Neutrophils Relative %: 93 %
Platelets: 14 10*3/uL — CL (ref 150–400)
RBC: 3.28 MIL/uL — ABNORMAL LOW (ref 4.22–5.81)
RDW: 14.4 % (ref 11.5–15.5)
WBC: 13.7 10*3/uL — ABNORMAL HIGH (ref 4.0–10.5)
nRBC: 0 % (ref 0.0–0.2)

## 2020-01-03 NOTE — Progress Notes (Signed)
Daily Progress Note   Patient Name: Gabriel [name]       Date: 01/03/2020 DOB: October 15, 1980  Age: 39 y.o. MRN#: [id number] Attending Physician: Mercy Riding, MD Primary Care Physician: Kerin Perna, NP Admit Date: 12/10/2019  Reason for Consultation/Follow-up: Establishing goals of care  Subjective:  not as awake alert this morning, mother at bedside, 48 year old daughter Gabriel [name] is also at the bedside. Continues to appear with ongoing functional decline, cancer related cachexia. Med history reviewed.    Length of Stay: 11  Current Medications: Scheduled Meds:  . sodium chloride   Intravenous Once  . sodium chloride   Intravenous Once  . Chlorhexidine Gluconate Cloth  6 each Topical Daily  . feeding supplement  1 Container Oral BID BM  . feeding supplement (KATE FARMS STANDARD 1.4)  325 mL Oral Daily  . indomethacin  100 mg Rectal Once  . metoprolol succinate  12.5 mg Oral Daily  . morphine  60 mg Oral Q12H  . multivitamin with minerals  1 tablet Oral Daily  . nystatin  5 mL Oral QID    Continuous Infusions: . ciprofloxacin Stopped (01/03/20 0320)  . dextrose 5 % and 0.9% NaCl 100 mL/hr at 01/03/20 0725  . metronidazole 500 mg (01/03/20 0320)    PRN Meds: morphine, morphine injection, sodium chloride flush  Physical Exam         Not as awake this morning.  No distress Appears frail weak Has muscle wasting No edema Regular   Vital Signs: BP 102/74 (BP Location: Left Arm)   Pulse 89   Temp 97.9 F (36.6 C) (Oral)   Resp 16   Ht 5\' 8"  (1.727 m)   Wt 39.6 kg   SpO2 100%   BMI 13.27 kg/m  SpO2: SpO2: 100 % O2 Device: O2 Device: Room Air O2 Flow Rate: O2 Flow Rate (L/min): 8 L/min  Intake/output summary:   Intake/Output Summary (Last 24 hours) at 01/03/2020  1050 Last data filed at 01/03/2020 0930 Gross per 24 hour  Intake 2337 ml  Output 1000 ml  Net 1337 ml   LBM: Last BM Date: 01/01/20 Baseline Weight: Weight: 39.6 kg Most recent weight: Weight: 39.6 kg       Palliative Assessment/Data:    Flowsheet Rows     Most Recent Value  Intake Tab  Referral Department Hospitalist  Unit at Time of Referral Med/Surg Unit  Palliative Care Primary Diagnosis Cancer  Date Notified 12/28/2019  Palliative Care Type New Palliative care  Reason for referral Clarify Goals of Care  Date of Admission 12/13/2019  Date first seen by Palliative Care 12/25/19  # of days Palliative referral response time 2 Day(s)  # of days IP prior to Palliative referral 0  Clinical Assessment  Palliative Performance Scale Score 50%  Psychosocial & Spiritual Assessment  Palliative Care Outcomes  Patient/Family meeting held? Yes  Who was at the meeting? Patient, fiancee, mother  Palliative Care Outcomes Clarified goals of care      Patient Active Problem List   Diagnosis Date Noted  . Protein-calorie malnutrition, severe 01/02/2020  . Thrombocytopenia ([place])   . Pressure injury of skin 12/26/2019  . Failure to thrive in adult 12/17/2019  . Sepsis secondary to UTI ([place]) 01/01/2020  . Pulmonary embolism ([place]) 12/26/2019  . Pleural effusion 12/08/2019  . Dilated bile duct   . Pancreatic mass   . Anemia due to antineoplastic chemotherapy 02/14/2019  . Family history of GI malignancy 02/14/2019  . Protein malnutrition ([place]) 01/24/2019  . Family history of breast cancer   . Family history of prostate cancer   . Cancer-related pain 01/10/2019  . Goals of care, counseling/discussion 01/10/2019  . Malignant neoplasm of head of pancreas ([place])   . Weight loss     Palliative Care Assessment & Plan   Patient Profile:  Mr. Stogdill is a 39 year old male with PMHx metastatic pancreatic cancer on palliative chemotherapy with plan for radiation who was admitted with  decreased appetite and weakness.  CT revealed subsegmental PE, moderate right effusion, liver abscesses, and metastatic disease in lower lungs.  Palliative consulted for goals of care.    Assessment:  generalized weakness.  Generalized pain.  Functional decline.   Recommendations/Plan:   continue cheduled MS Contin 60 mg PO Q 12 hours, also on MSIR 15 mg PO Q 4 hours PRN. Also has rescue IV Morphine PRN available.     Recommend home with hospice and full focus on comfort measures and aggressive symptom management.    No further PMT specific recommendations at this time.   Code Status:    Code Status Orders  (From admission, onward)         Start     Ordered   12/07/2019 1729  Full code  Continuous        12/13/2019 1728        Code Status History    This patient has a current code status but no historical code status.   Advance Care Planning Activity       Prognosis:   guarded  Discharge Planning:  Recommend home with hospice.   Care plan was discussed with  Patient, mother daughter.   Thank you for allowing the Palliative Medicine Team to assist in the care of this patient.   Time In: 10 Time Out: 10.25 Total Time 25 Prolonged Time Billed  no       Greater than 50%  of this time was spent counseling and coordinating care related to the above assessment and plan.  Loistine Chance, MD  Please contact Palliative Medicine Team phone at [phone] for questions and concerns.

## 2020-01-03 NOTE — Progress Notes (Signed)
PROGRESS NOTE  Gabriel [name] CNO:[id number] DOB: 20-Dec-1980   PCP: Kerin Perna, NP  Patient is from: Home  DOA: 12/14/2019 LOS: 72  Brief Narrative / Interim history: 39 year old male with history of pancreatic adenocarcinoma with SMA involvement s/p palliative chemotherapy with gemcitabine and Abraxane, biliary obstruction status post biliary stent in 09/2019 presenting with decreased appetite, generalized weakness and altered mental status, and admitted for sepsis in the setting of Klebsiella UTI and hepatic abscess, and moderate right-sided PE.  Per IR, patient is not a great candidate for PERC drain placement.  He was started on IV meropenem and transition to Cipro and Flagyl for a total of 14 days oncology recommended hospice.  TOC consulted for home hospice.  PMT consulted and following.    Subjective: Seen and examined earlier this morning.  No major events overnight of this morning.  No complaints.  He denies pain but looks withdrawn.  Patient's mother and daughter at bedside.  Per patient's mother, patient's wife is in the process of moving apartment.   Objective: Vitals:   01/02/20 1644 01/02/20 2217 01/03/20 0548 01/03/20 1329  BP: 99/67 108/80 102/74 94/71  Pulse: 99 (!) 102 89 85  Resp:  18 16 16   Temp: 98.6 F (37 C) 97.9 F (36.6 C) 97.9 F (36.6 C) (!) 97.5 F (36.4 C)  TempSrc: Oral Oral Oral Axillary  SpO2: 98% 100% 100% 100%  Weight:      Height:        Intake/Output Summary (Last 24 hours) at 01/03/2020 1351 Last data filed at 01/03/2020 1325 Gross per 24 hour  Intake 2337 ml  Output 1100 ml  Net 1237 ml   Filed Weights   12/24/19 1440  Weight: 39.6 kg    Examination:  GENERAL: Chronically ill-appearing.  Nontoxic. HEENT: MMM.  Vision and hearing grossly intact.  NECK: Supple.  No apparent JVD.  RESP: On room air.  No IWOB.  Fair aeration bilaterally. CVS:  RRR. Heart sounds normal.  ABD/GI/GU: BS+. Abd soft, NTND.  MSK/EXT: Significant  muscle mass and subcu fat loss.  Swelling in RLE from IV infiltration. SKIN: no apparent skin lesion or wound NEURO: Awake, alert and oriented appropriately.  No apparent focal neuro deficit. PSYCH: Calm.  Flat affect  Procedures:  None  Microbiology summarized: COVID-19 PCR negative. Urine culture with 80,000 colonies of Klebsiella resistant to ampicillin  Assessment & Plan: Sepsis due to hepatic abscess/cholangitis and Klebsiella UTI  Biliary obstruction s/p biliary stent:  -ERCP on 9/23 with near complete blockage of previously placed metallic stent.  Plastic biliary stent placed.  -Per discussion between previous attending and IR, not a candidate for PERC drain placement -IV meropenem>> Cipro and Flagyl on 9/24>> I doubt this would change the outcome.  Metastatic pancreatic cancer s/p palliative chemo with gemcitabine and Abraxane.  Multiple nodules noted on CT abdomen and pelvis. -Oncology recommended hospice.  TOC consulted for home hospice. However, patient and family has not decided yet -Pain control per oncology and palliative care  Acute small subsegmental PE Portal vein and splenic vein thrombosis -Not a candidate for anticoagulation due to severe thrombocytopenia -Oncology recommended holding off IVC filter placement  Ascites/anasarca Moderate bilateral pleural effusion -Likely due to severe malnutrition and malignancy  Moderate right pleural effusion-likely due to severe malnutrition and malignancy -Status post thoracocentesis on 9/23  Severe thrombocytopenia: Likely consumptive.  DIC?  No obvious signs of bleeding. -Holding off platelet transfusion unless active bleeding -Hold off anticoagulation  SVT the night  of 9/26.  Started on Toprol-XL which was held due to bradycardia  GOC: Patient with the above comorbidities.  Poor prognosis likely days to weeks.  He is still full code which I believe would pose more harm than benefit.  Per previous attending note,  patient's wife likes to take patient home for his last few days. However, she is in the process of moving apartment. Attempts to reach her over the phone were unsuccessful as she does not answer the phone. -Palliative medicine following.  Severe malnutrition as evidenced by significant muscle mass and subcu fat loss Body mass index is 13.27 kg/m. Nutrition Problem: Severe Malnutrition Etiology: chronic illness, cancer and cancer related treatments Signs/Symptoms: severe fat depletion, severe muscle depletion Interventions: Boost Breeze, MVI, Other (Comment) [employer] 1.4 po)   Sacral decubitus ulcer: Stage II.  POA Pressure Injury 12/24/19 Sacrum Mid Stage 2 -  Partial thickness loss of dermis presenting as a shallow open injury with a red, pink wound bed without slough. (Active)  12/24/19 1430  Location: Sacrum  Location Orientation: Mid  Staging: Stage 2 -  Partial thickness loss of dermis presenting as a shallow open injury with a red, pink wound bed without slough.  Wound Description (Comments):   Present on Admission: Yes   DVT prophylaxis:  SCDs Start: 12/18/2019 1729  Code Status: Full code Family Communication: Discussed with patient's mother and daughter at bedside.  Attempted to call patient's wife but no answer.  Status is: Inpatient  Remains inpatient appropriate because:Unsafe d/c plan, IV treatments appropriate due to intensity of illness or inability to take PO and Inpatient level of care appropriate due to severity of illness   Dispo:  Patient From: Home  Planned Disposition: Home Hospice  Expected discharge date: 01/04/20  Medically stable for discharge: No        Consultants:  Oncology Palliative medicine Interventional radiology   Sch Meds:  Scheduled Meds: . sodium chloride   Intravenous Once  . sodium chloride   Intravenous Once  . Chlorhexidine Gluconate Cloth  6 each Topical Daily  . feeding supplement  1 Container Oral BID BM  . feeding  supplement ([employer] STANDARD 1.4)  325 mL Oral Daily  . indomethacin  100 mg Rectal Once  . metoprolol succinate  12.5 mg Oral Daily  . morphine  60 mg Oral Q12H  . multivitamin with minerals  1 tablet Oral Daily  . nystatin  5 mL Oral QID   Continuous Infusions: . ciprofloxacin Stopped (01/03/20 0320)  . dextrose 5 % and 0.9% NaCl 100 mL/hr at 01/03/20 0725  . metronidazole 500 mg (01/03/20 1237)   PRN Meds:.morphine, morphine injection, sodium chloride flush  Antimicrobials: Anti-infectives (From admission, onward)   Start     Dose/Rate Route Frequency Ordered Stop   12/28/19 1400  ciprofloxacin (CIPRO) IVPB 400 mg        400 mg 200 mL/hr over 60 Minutes Intravenous Every 12 hours 12/28/19 0910     12/28/19 1200  metroNIDAZOLE (FLAGYL) IVPB 500 mg        500 mg 100 mL/hr over 60 Minutes Intravenous Every 8 hours 12/28/19 0910     12/13/2019 2200  meropenem (MERREM) 1 g in sodium chloride 0.9 % 100 mL IVPB  Status:  Discontinued        1 g 200 mL/hr over 30 Minutes Intravenous Every 8 hours 12/19/2019 1730 12/28/19 0908   12/26/2019 2200  meropenem (MERREM) 1 g in sodium chloride 0.9 % 100  mL IVPB  Status:  Discontinued        1 g 200 mL/hr over 30 Minutes Intravenous Every 8 hours 12/22/2019 1706 12/23/2019 1730   01/03/2020 1730  meropenem (MERREM) 1 g in sodium chloride 0.9 % 100 mL IVPB  Status:  Discontinued        1 g 200 mL/hr over 30 Minutes Intravenous  Once 12/30/2019 1728 12/26/2019 1731   12/10/2019 1230  aztreonam (AZACTAM) 2 g in sodium chloride 0.9 % 100 mL IVPB        2 g 200 mL/hr over 30 Minutes Intravenous  Once 12/11/2019 1229 01/02/2020 1527   12/15/2019 1230  metroNIDAZOLE (FLAGYL) IVPB 500 mg        500 mg 100 mL/hr over 60 Minutes Intravenous  Once 12/26/2019 1229 01/02/2020 1400   12/19/2019 1230  vancomycin (VANCOCIN) IVPB 1000 mg/200 mL premix        1,000 mg 200 mL/hr over 60 Minutes Intravenous  Once 12/17/2019 1229 12/30/2019 1300       I have personally reviewed the  following labs and images: CBC: Recent Labs  Lab 12/30/19 0451 12/31/19 0237 01/01/20 0411 01/02/20 0422 01/03/20 0400  WBC 8.0 18.8* 16.4* 15.0* 13.7*  NEUTROABS 7.0 18.6* 15.4* 13.8* 12.7*  HGB 9.7* 10.1* 9.9* 10.6* 8.9*  HCT 29.4* 30.2* 29.5* 31.5* 26.8*  MCV 84.0 83.7 82.4 81.0 81.7  PLT 102* 51* 11* 13* 14*   BMP &GFR Recent Labs  Lab 12/30/19 0451 12/31/19 0237 01/01/20 0411 01/02/20 0422 01/03/20 0400  NA 136 136 137 138 137  K 3.9 3.7 3.9 3.9 3.7  CL 105 104 107 107 108  CO2 29 26 26 26 24   GLUCOSE 79 89 83 102* 103*  BUN 21* 21* 22* 23* 23*  CREATININE <0.30* 0.30* 0.34* <0.30* <0.30*  CALCIUM 7.5* 7.5* 7.4* 7.5* 7.1*  MG  --   --  1.7  --   --    CrCl cannot be calculated (This lab value cannot be used to calculate CrCl because it is not a number: <0.30). Liver & Pancreas: Recent Labs  Lab 12/30/19 0451 12/31/19 0237 01/01/20 0411 01/02/20 0422 01/03/20 0400  AST 37 76* 156* 102* 73*  ALT 53* 61* 83* 82* 65*  ALKPHOS 376* 408* 425* 433* 414*  BILITOT 2.0* 3.3* 2.4* 2.8* 2.5*  PROT 5.2* 5.0* 4.9* 5.2* 4.4*  ALBUMIN 1.9* 1.8* 1.7* 1.7* 1.5*   Recent Labs  Lab 12/28/19 0722  LIPASE 24   No results for input(s): AMMONIA in the last 168 hours. Diabetic: No results for input(s): HGBA1C in the last 72 hours. No results for input(s): GLUCAP in the last 168 hours. Cardiac Enzymes: No results for input(s): CKTOTAL, CKMB, CKMBINDEX, TROPONINI in the last 168 hours. No results for input(s): PROBNP in the last 8760 hours. Coagulation Profile: No results for input(s): INR, PROTIME in the last 168 hours. Thyroid Function Tests: No results for input(s): TSH, T4TOTAL, FREET4, T3FREE, THYROIDAB in the last 72 hours. Lipid Profile: No results for input(s): CHOL, HDL, LDLCALC, TRIG, CHOLHDL, LDLDIRECT in the last 72 hours. Anemia Panel: No results for input(s): VITAMINB12, FOLATE, FERRITIN, TIBC, IRON, RETICCTPCT in the last 72 hours. Urine analysis:      Component Value Date/Time   COLORURINE AMBER (A) 12/25/2019 1103   APPEARANCEUR CLEAR 12/06/2019 1103   LABSPEC 1.017 12/14/2019 1103   PHURINE 5.0 12/15/2019 1103   GLUCOSEU NEGATIVE 12/31/2019 1103   HGBUR MODERATE (A) 12/13/2019 1103   BILIRUBINUR NEGATIVE  12/09/2019 [place] 01/03/2020 1103   PROTEINUR NEGATIVE 12/27/2019 1103   NITRITE NEGATIVE 12/22/2019 1103   LEUKOCYTESUR SMALL (A) 12/13/2019 1103   Sepsis Labs: Invalid input(s): PROCALCITONIN, [place]  Microbiology: No results found for this or any previous visit (from the past 240 hour(s)).  Radiology Studies: No results found.    Buelah Rennie T. [place]  If 7PM-7AM, please contact night-coverage [email or web address] 01/03/2020, 1:51 PM

## 2020-01-03 NOTE — Progress Notes (Signed)
I was able to get hold of patient's wife, Shirlean Mylar over the phone this afternoon. She says she is in the middle of changing apartments and moving stuff. She says she would like to have Mr. Blenheim home but likes to make sure she has enough support as she has to work. She says it is okay to contact hospice but likes to discuss this with Mr. Wierzbicki brother and other family members. She says she will definitely have an answer by tomorrow afternoon.

## 2020-01-03 NOTE — Progress Notes (Addendum)
HEMATOLOGY-ONCOLOGY PROGRESS NOTE  SUBJECTIVE: Resting quietly this morning.  Mother and daughter are at the bedside.  Mother states that they have still not made a final decision regarding hospice.  She indicates that the patient's wife and his brother will be coming to help make decisions.  The patient's brother will not be here until tomorrow.  Pain well controlled.  No bleeding.  Oncology History  Malignant neoplasm of head of pancreas [facility])   Initial Diagnosis   Pancreatic adenocarcinoma ([place])   12/15/2018 Imaging   CT abdomen/pelvis w/ contrast: IMPRESSION: 1. Suggestion of a 1.5 cm mass within the mid aspect of the pancreatic body with associated atrophy of the upstream pancreatic tail and associated pancreatic ductal dilatation. Recommend further evaluation with MRI. There is soft tissue fullness around the superior mesenteric artery and celiac axis. This may represent edema or potentially tumor involvement. 2. Tree-in-bud nodularity within the peripheral aspect of the lower lobes bilaterally which may be secondary to an infectious or inflammatory process. Recommend follow-up chest CT in 3 months to assess for interval change. 3. These results will be called to the ordering clinician or representative by the Radiologist Assistant, and communication documented in the PACS or zVision Dashboard.   12/29/2018 Procedure   EUS: -2.4 x 1.9cm mass in the body of the pancreas causing the main pancreatic duct obstruction and clearly involving the celiac trunk. Biopsied. -Heterogenous soft tissue mass in the neck, measuring 3.6cm maximally. This was not sampled, presumed to be a large thyroid gland.  -No peripancreatic adenopathy -CBD was normal, non-dilated.   12/29/2018 Pathology Results   CASE: [id number]   DIAGNOSIS:  - Malignant cells consistent with adenocarcinoma  - See comment.   01/17/2019 Imaging   PET: IMPRESSION: 1. Suspected enlargement of the pancreatic mass,  with abnormal hypermetabolic activity at the junction of the pancreatic body and tail currently measuring 3.4 by 2.9 by 2.4 cm. Although anatomic localization is hampered by the patient's extreme paucity of adipose tissue (making separation of adjacent structures difficult on noncontrast CT), there is thought to be a high likelihood tumor extending around the vicinity of the celiac trunk and possibly the superior mesenteric artery. In this case, MRI might provide better anatomic localization with respect to perivascular involvement. 2. Potential mild left periaortic adenopathy, maximum SUV of the indistinct left periaortic lymph nodes is approximately 3.3 which is above blood pool levels. 3. Scattered ground-glass density nodules in the lungs favoring the upper lobes, with tree-in-bud nodularity in the lung bases favoring the lower lobes. None of these lesions are hypermetabolic. While possibilities include atypical infectious process drug reaction, surveillance of the chest is recommended to exclude low-grade adenocarcinoma. 4.  Aortic Atherosclerosis (ICD10-I70.0).   01/24/2019 -  Chemotherapy   The patient had PACLitaxel-protein bound (ABRAXANE) chemo infusion 150 mg, 100 mg/m2 = 150 mg (100 % of original dose 100 mg/m2), Intravenous,  Once, 6 of 8 cycles Dose modification: 100 mg/m2 (original dose 100 mg/m2, Cycle 1, Reason: Provider Judgment) Administration: 150 mg (01/24/2019), 150 mg (02/14/2019), 150 mg (03/02/2019), 150 mg (03/16/2019), 150 mg (04/26/2019), 150 mg (07/04/2019), 150 mg (07/18/2019), 150 mg (05/09/2019), 150 mg (05/23/2019), 150 mg (08/08/2019), 150 mg (08/22/2019), 150 mg (06/22/2019) gemcitabine (GEMZAR) 1,102 mg in sodium chloride 0.9 % 250 mL chemo infusion, 800 mg/m2 = 1,102 mg (100 % of original dose 800 mg/m2), Intravenous,  Once, 6 of 8 cycles Dose modification: 800 mg/m2 (original dose 800 mg/m2, Cycle 1, Reason: Provider Judgment), 1,000 mg/m2 (original dose 800  mg/m2, Cycle 5, Reason: Provider Judgment) Administration: 1,102 mg (01/24/2019), 1,102 mg (02/14/2019), 1,102 mg (03/02/2019), 1,102 mg (03/16/2019), 1,102 mg (04/26/2019), 1,406 mg (07/04/2019), 1,406 mg (07/18/2019), 1,406 mg (05/09/2019), 1,406 mg (05/23/2019), 1,406 mg (08/08/2019), 1,406 mg (08/22/2019), 1,406 mg (06/22/2019)  for chemotherapy treatment.    02/28/2019 Cancer Staging   Staging form: Exocrine Pancreas, AJCC 8th Edition - Clinical: Stage IB (cT2, cN0, cM0) - Signed by Tish Men, MD on 02/28/2019   04/16/2019 Imaging   CT CAP: IMPRESSION: 1. Margins of pancreatic neoplasm are difficult to identify. Similar appearance of soft tissue infiltration within the retroperitoneum with progressive narrowing of the portal venous confluence by tumor. No specific findings of solid organ metastasis or nodal metastasis within the abdomen or pelvis. 2. Unchanged appearance of multifocal sub solid nodules within the upper lung zones and bilateral lower lung zone tree-in-bud nodularity. As mentioned previously findings are nonspecific and may be inflammatory or infectious in etiology. Metastatic disease is not excluded. 3. Aortic atherosclerosis. 4. Right lobe of thyroid gland nodule. Consider further evaluation with thyroid ultrasound. If patient is clinically hyperthyroid, consider nuclear medicine thyroid uptake and scan.    PHYSICAL EXAMINATION:  Vitals:   01/02/20 2217 01/03/20 0548  BP: 108/80 102/74  Pulse: (!) 102 89  Resp: 18 16  Temp: 97.9 F (36.6 C) 97.9 F (36.6 C)  SpO2: 100% 100%   Filed Weights   12/24/19 1440  Weight: 39.6 kg    Intake/Output from previous day: 09/29 0701 - 09/30 0700 In: 2337 [P.O.:180; I.V.:1599.8; IV Piggyback:557.2] Out: -   GENERAL: Alert, cachectic, no distress; minimal verbal interaction HEENT: Mild scleral icterus.  Mild thrush.  No bleeding. SKIN: No petechiae at the lower legs. LUNGS: Distant breath sounds.  No respiratory  distress. HEART: regular rate & rhythm  ABDOMEN: Mild generalized tenderness. Vascular: Pitting edema at the lower legs bilaterally. NEURO: Follows commands Extremities: Right upper extremity edema beginning at the elbow and extending upward.  LABORATORY DATA:  I have reviewed the data as listed CMP Latest Ref Rng & Units 01/03/2020 01/02/2020 01/01/2020  Glucose 70 - 99 mg/dL 103(H) 102(H) 83  BUN 6 - 20 mg/dL 23(H) 23(H) 22(H)  Creatinine 0.61 - 1.24 mg/dL <0.30(L) <0.30(L) 0.34(L)  Sodium 135 - 145 mmol/L 137 138 137  Potassium 3.5 - 5.1 mmol/L 3.7 3.9 3.9  Chloride 98 - 111 mmol/L 108 107 107  CO2 22 - 32 mmol/L 24 26 26   Calcium 8.9 - 10.3 mg/dL 7.1(L) 7.5(L) 7.4(L)  Total Protein 6.5 - 8.1 g/dL 4.4(L) 5.2(L) 4.9(L)  Total Bilirubin 0.3 - 1.2 mg/dL 2.5(H) 2.8(H) 2.4(H)  Alkaline Phos 38 - 126 U/L 414(H) 433(H) 425(H)  AST 15 - 41 U/L 73(H) 102(H) 156(H)  ALT 0 - 44 U/L 65(H) 82(H) 83(H)    Lab Results  Component Value Date   WBC 13.7 (H) 01/03/2020   HGB 8.9 (L) 01/03/2020   HCT 26.8 (L) 01/03/2020   MCV 81.7 01/03/2020   PLT 14 (LL) 01/03/2020   NEUTROABS 12.7 (H) 01/03/2020    DG Chest 2 View  Result Date: 12/30/2019 CLINICAL DATA:  Pancreatic cancer with right-sided pleural effusion. EXAM: CHEST - 2 VIEW COMPARISON:  December 27, 2019 FINDINGS: Injectable port in stable position. Cardiomediastinal silhouette is normal. Mediastinal contours appear intact. Stable right pleural effusion. Multiple nodular opacities, predominantly in the lung bases persist. Osseous structures are without acute abnormality. Soft tissues are grossly normal. IMPRESSION: Stable moderate right pleural effusion. Stable appearance of multiple nodular  opacities throughout both lungs. Electronically Signed   By: Fidela Salisbury M.D.   On: 12/30/2019 10:56   CT Head Wo Contrast  Result Date: 12/21/2019 CLINICAL DATA:  Pancreatic cancer, chemotherapy, weakness, headache EXAM: CT HEAD WITHOUT  CONTRAST TECHNIQUE: Contiguous axial images were obtained from the base of the skull through the vertex without intravenous contrast. COMPARISON:  None. FINDINGS: Brain: No evidence of acute infarction, hemorrhage, hydrocephalus, extra-axial collection or mass lesion/mass effect. Vascular: No hyperdense vessel or unexpected calcification. Skull: Normal. Negative for fracture or focal lesion. Sinuses/Orbits: No acute finding. Other: None. IMPRESSION: No acute intracranial pathology. No non-contrast CT evidence of intracranial metastatic disease or findings to explain headache. Contrast enhanced MRI may be used to more sensitively evaluate for intracranial metastatic disease if desired. Electronically Signed   By: Eddie Candle M.D.   On: 12/12/2019 11:30   CT Angio Chest PE W and/or Wo Contrast  Result Date: 12/26/2019 CLINICAL DATA:  Shortness of breath and chest pain. Suspect pulmonary embolism. History of pancreatic cancer on chemotherapy. EXAM: CT ANGIOGRAPHY CHEST WITH CONTRAST TECHNIQUE: Multidetector CT imaging of the chest was performed using the standard protocol during bolus administration of intravenous contrast. Multiplanar CT image reconstructions and MIPs were obtained to evaluate the vascular anatomy. CONTRAST:  17mL OMNIPAQUE IOHEXOL 350 MG/ML SOLN COMPARISON:  08/29/2019 FINDINGS: Cardiovascular: Right IJ central venous catheter is present with tip over the cavoatrial junction. Heart is normal size. Thoracic aorta is normal caliber. Pulmonary arterial system is well opacified and demonstrates a single small embolus over a right lower lobar subsegmental pulmonary artery. No left-sided pulmonary emboli. Mediastinum/Nodes: No evidence of mediastinal or hilar adenopathy. Remaining mediastinal structures are unremarkable. Lungs/Pleura: Lungs are adequately inflated demonstrate a moderate size right pleural effusion with associated atelectasis in the right base. There are numerous small bilateral  pulmonary nodules with interval progression likely worsening metastatic disease. Airways are unremarkable. Upper Abdomen: Not well evaluated on this arterial phase CT scan. Suggestion of partial visualization of patient's known biliary stent. Moderate pneumobilia is present. Couple low-density foci over the right lobe of the liver with air-fluid levels of uncertain clinical significance. Musculoskeletal: No focal abnormality. Review of the MIP images confirms the above findings. IMPRESSION: 1. Single small embolus over a right lower lobar subsegmental pulmonary artery. 2. Moderate size right pleural effusion with associated right basilar atelectasis. 3. Interval progression of multiple pulmonary nodules likely metastatic disease due to patient's known pancreatic cancer. 4. Couple indeterminate low-density foci over the right lobe of the liver with air-fluid levels of not well evaluated on this arterial phase contrast CT. Pneumobilia and partially visualized biliary stent. Recommend clinical correlation as further evaluation with venous phase contrast enhanced CT of the abdomen/pelvis would be helpful. Critical Value/emergent results were called by telephone at the time of interpretation on 01/01/2020 at 3:08 pm to provider [facility] , who verbally acknowledged these results. Electronically Signed   By: Marin Olp M.D.   On: 12/31/2019 15:10   CT ABDOMEN PELVIS W CONTRAST  Result Date: 12/31/2019 CLINICAL DATA:  Adnexal past.  History of pancreatic cancer. EXAM: CT ABDOMEN AND PELVIS WITH CONTRAST TECHNIQUE: Multidetector CT imaging of the abdomen and pelvis was performed using the standard protocol following bolus administration of intravenous contrast. CONTRAST:  149mL OMNIPAQUE IOHEXOL 300 MG/ML  SOLN COMPARISON:  December 25, 2019. FINDINGS: Lower chest: Moderate bilateral pleural effusions are noted with multiple nodule seen involving the visualized lung bases consistent with metastatic disease.  Hepatobiliary: Continued presence of  stent within common bile duct. There has been placement of a nother stent within the common bile duct that extends from cavitary abnormality in upper pole of right kidney and through common bile duct into the duodenum. No gallstones are noted. Extensive pneumobilia is again noted. There remains multiple rounded low densities some of which with air-fluid levels, concerning for hepatic abscesses. The largest measures 4.3 cm inferiorly in the right hepatic lobe. Pancreas: There is again noted pancreatic head mass which is poorly defined. It appears to surround portions of the celiac and superior mesenteric arteries. It is difficult to determine the size given its ill-defined and infiltrative nature. Spleen: Normal in size without focal abnormality. Adrenals/Urinary Tract: Adrenal glands and kidneys appear normal. No hydronephrosis or renal obstruction is noted. Moderate urinary bladder distention is noted. Stomach/Bowel: Stomach is within normal limits. Appendix appears normal. No evidence of bowel wall thickening, distention, or inflammatory changes. Vascular/Lymphatic: Atherosclerosis of abdominal aorta is noted without aneurysm or dissection. As noted above, there appears to be extension of the pancreatic mass around the vasculature of the proximal abdominal aorta and celiac and superior mesenteric branches. No discrete adenopathy is noted. 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 Reproductive: Prostate is unremarkable. Other: Significantly increased anasarca is noted. Mildly increased ascites is noted as well. Musculoskeletal: No acute or significant osseous findings. IMPRESSION: 1. 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 Significantly increased ascites and anasarca is noted compared to prior exam. These results will be called to the ordering clinician or representative by the  Radiologist Assistant, and communication documented in the PACS or zVision Dashboard. 2. Moderate bilateral pleural effusions are noted with multiple nodules seen involving the visualized lung bases consistent with metastatic disease. 3. Continued presence of stent within common bile duct. There has been placement of another stent within the common bile duct that extends from cavitary abnormality in upper pole of right kidney and through common bile duct into the duodenum. Extensive pneumobilia is again noted. 4. Multiple rounded low densities are noted some of which with air-fluid levels, concerning for hepatic abscesses. The largest measures 4.3 cm inferiorly in the right hepatic lobe. 5. There is again noted pancreatic head mass which is poorly defined. It is difficult to determine the size given its ill-defined and infiltrative nature. It appears to surround portions of the celiac and superior mesenteric arteries. 6. Moderate urinary bladder distention is noted. 7. Aortic atherosclerosis. Aortic Atherosclerosis (ICD10-I70.0). Electronically Signed   By: Marijo Conception M.D.   On: 12/31/2019 15:58   CT ABDOMEN PELVIS W CONTRAST  Result Date: 12/25/2019 CLINICAL DATA:  39 year old male with history of abdominal pain. Suspected abscess. EXAM: CT ABDOMEN AND PELVIS WITH CONTRAST TECHNIQUE: Multidetector CT imaging of the abdomen and pelvis was performed using the standard protocol following bolus administration of intravenous contrast. CONTRAST:  43mL OMNIPAQUE IOHEXOL 300 MG/ML  SOLN COMPARISON:  CT the abdomen and pelvis 08/29/2019. FINDINGS: Lower chest: Moderate right and small left pleural effusions lying dependently. Some pleural enhancement and thickening in the lower right hemithorax suggestive of malignant pleural involvement. Numerous pulmonary nodules scattered throughout the visualize lung bases, including areas of confluent nodularity in the lower lobes of the lungs bilaterally, most compatible with  progressive metastatic disease. Hepatobiliary: Compared to the prior study there are numerous low-attenuation lesions within the liver, highly suspicious for multifocal intrahepatic abscesses (less likely to represent centrally  necrotic metastatic lesions). The largest of these has internal septations and measures up to 3.6 x 3.5 cm in segment 5 (axial image 30 of series 2). One of these lesions in segment 7/8 of the liver has some non dependent gas, strongly favoring abscess. Extensive intrahepatic biliary ductal dilatation with a large volume of pneumobilia. Gallbladder is unremarkable in appearance. Interval placement of common bile duct stent which appears appropriately located. Pancreas: Poorly defined pancreatic mass in the superior aspect of the head of the pancreas (soft tissues in the superior aspect of the head of the pancreas are somewhat indistinct, potentially treatment related). Haziness in the peripancreatic fat, including soft tissue around portions of the celiac axis, common hepatic artery and proximal superior mesenteric artery, as well as the left renal vein, all of which appear narrowed, likely indicative of locally infiltrative tumor. This lesion is difficult to definitively identify and measure, but is estimated to measure approximately 4.4 x 3.0 cm (axial image 27 of series 2). Spleen: The appearance of the spleen is unremarkable. Adrenals/Urinary Tract: Bilateral kidneys and adrenal glands are normal in appearance. No hydroureteronephrosis. Urinary bladder is unremarkable in appearance. Stomach/Bowel: The appearance of the stomach is unremarkable. No pathologic dilatation of small bowel or colon. Portions of the colon appear thickened with some mucosal hyperenhancement (poorly evaluated given the adjacent ascites), best appreciated in the region of the cecum, ascending colon and hepatic flexure, suggestive of underlying colitis. Appendix is not confidently identified. Vascular/Lymphatic:  Aortic atherosclerosis, without evidence of aneurysm or dissection in the abdominal or pelvic vasculature. Vascular involvement from the pancreatic mass, discussed above. Poorly defined infiltrative soft tissue throughout the retroperitoneum adjacent to the pancreatic mass, with soft tissue completely encircling the upper abdominal aorta immediately below the level of the renal arteries (axial image 30 of series 2), presumably indicative of lymphatic spread of disease. Reproductive: Prostate gland and seminal vesicles are unremarkable in appearance. Other: Large volume of ascites. No pneumoperitoneum. Diffuse body wall edema. Musculoskeletal: There are no aggressive appearing lytic or blastic lesions noted in the visualized portions of the skeleton. IMPRESSION: 1. Today's study demonstrates what appears to be progression of disease with innumerable metastatic lesions throughout the visualize lung bases, probable malignant moderate right pleural effusion and small left pleural effusion, enlargement of the primary pancreatic mass now with vascular involvement, and what appears to be evolving retroperitoneal lymphadenopathy encasing the abdominal aorta. 2. In addition, there has been interval placement of a common bile duct stent, now with substantial intrahepatic biliary ductal dilatation, large amount of pneumatosis, and multiple new low-attenuation liver lesions, some of which have internal gas fluid levels, highly concerning for multiple intra-abdominal abscesses and probable cholangitis. 3. Profound mural thickening and mucosal hyperenhancement in portions of the colon (predominantly cecum, ascending colon and hepatic flexure), concerning for colitis. 4. Aortic atherosclerosis. 5. Additional incidental findings, as above. Electronically Signed   By: Vinnie Langton M.D.   On: 12/25/2019 14:50   DG CHEST PORT 1 VIEW  Result Date: 12/31/2019 CLINICAL DATA:  History of metastatic pancreatic cancer. History of  cholangitis and hepatic abscesses. Pleural effusion. EXAM: PORTABLE CHEST 1 VIEW COMPARISON:  12/30/2019. FINDINGS: PowerPort catheter stable position. Heart size normal. Multiple nodular opacities are again noted throughout both lungs. Persistent bibasilar atelectasis and or infiltrates. Moderate right pleural effusion slightly increased from prior exam noted. No pneumothorax. Biliary stent right upper quadrant again noted. IMPRESSION: 1. Moderate right pleural effusion, slightly increased from prior exam. 2. Multiple nodular opacities again noted throughout  both lungs. Persistent bibasilar atelectasis and or infiltrates. Electronically Signed   By: Marcello Moores  Register   On: 12/31/2019 10:48   DG Chest Port 1 View  Result Date: 12/30/2019 CLINICAL DATA:  39 year old male, status post thoracentesis EXAM: PORTABLE CHEST 1 VIEW COMPARISON:  Chest CT 12/09/2019, chest x-ray 12/23/2018 FINDINGS: Cardiomediastinal silhouette unchanged in size and contour. Right IJ port catheter unchanged. Micro nodular pattern of opacity throughout the lungs, compatible with findings on the prior CT. Blunting of the right costophrenic angle persists, with no pneumothorax. No left-sided pleural fluid.  No new confluent airspace disease. IMPRESSION: No complicating features status post right-sided thoracentesis, with persistent small fluid/atelectasis at the right lung base. Micro nodular pattern of opacity of the bilateral lungs compatible with findings on the recent prior CT chest. Unchanged right IJ port catheter. Electronically Signed   By: Corrie Mckusick D.O.   On: 12/20/2019 16:04   DG Chest Port 1 View  Result Date: 12/25/2019 CLINICAL DATA:  Pancreatic cancer, weakness EXAM: PORTABLE CHEST 1 VIEW COMPARISON:  08/29/2019 chest CT. FINDINGS: Right internal jugular Port-A-Cath terminates at the cavoatrial junction. Normal heart size. Normal mediastinal contour. No pneumothorax. New small right pleural effusion. No left pleural  effusion. No pulmonary edema. Hazy patchy right lung base opacity. Indistinct tiny nodular opacities scattered in the lower lungs bilaterally. IMPRESSION: 1. New small right pleural effusion. 2. Hazy patchy right lung base opacity, favor atelectasis. 3. Indistinct tiny nodular opacities scattered in the lower lungs bilaterally, as seen on prior chest CT, cannot exclude metastatic disease. Electronically Signed   By: Ilona Sorrel M.D.   On: 12/09/2019 11:40   DG ERCP  Result Date: 12/31/2019 CLINICAL DATA:  Pancreas cancer, cholangitis, sepsis, stent occlusion EXAM: ERCP with common bile duct stent revision TECHNIQUE: Multiple spot images obtained with the fluoroscopic device and submitted for interpretation post-procedure. FLUOROSCOPY TIME:  Fluoroscopy Time:  10 minutes 37 seconds Radiation Exposure Index (if provided by the fluoroscopic device): Dense 110 mGy Number of Acquired Spot Images: Numerous spot fluoroscopic view COMPARISON:  12/25/2019 FINDINGS: Spot fluoroscopic views during ERCP confirm wire and catheter traversing the biliary metallic stent. Proximal contrast injection above the stent confirms biliary obstruction from stent occlusion. Endoscopic plastic biliary stent now traverses the previous metallic stent. 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 Successful plastic endoscopic stent placement traversing the proximal biliary tree through the CBD metallic stent. These images were submitted for radiologic interpretation only. Please see the procedural report for the amount of contrast and the fluoroscopy time utilized. Electronically Signed   By: Jerilynn Mages.  Shick M.D.   On: 12/10/2019 13:05   US THORACENTESIS ASP PLEURAL SPACE W/IMG GUIDE  Result Date: 12/15/2019 INDICATION: Patient with history of pancreatic cancer, failure to thrive, and right pleural effusion. Request made for diagnostic and therapeutic right thoracentesis EXAM: ULTRASOUND GUIDED DIAGNOSTIC  AND THERAPEUTIC RIGHT THORACENTESIS MEDICATIONS: 8 mL 1% lidocaine COMPLICATIONS: None immediate. PROCEDURE: An ultrasound guided thoracentesis was thoroughly discussed with the patient and questions answered. The benefits, risks, alternatives and complications were also discussed. The patient understands and wishes to proceed with the procedure. Written consent was obtained. Ultrasound was performed to localize and mark an adequate pocket of fluid in the right chest. The area was then prepped and draped in the normal sterile fashion. 1% Lidocaine was used for local anesthesia. Under ultrasound guidance a 6 Fr Safe-T-Centesis catheter was introduced. Thoracentesis was performed. The catheter was removed and a dressing applied. FINDINGS: A total  of approximately 850 mL of dark red fluid was removed. Samples were sent to the laboratory as requested by the clinical team. IMPRESSION: Successful ultrasound guided right thoracentesis yielding 850 mL of pleural fluid. Read by: Earley Abide, PA-C Electronically Signed   By: Corrie Mckusick D.O.   On: 12/20/2019 16:00    ASSESSMENT AND PLAN: 1. Pancreas cancer  11/29/2018 CT abdomen/pelvis without contrast-appendix not well visualized, limited evaluation due to lack of enteric contrast and perineal fat  12/15/2018 CT abdomen/pelvis with contrast-possible 1.5 cm mass within the mid aspect of the pancreatic body with associated atrophy of the upstream pancreatic tail and associated pancreatic ductal dilatation. Soft tissue fullness around the superior mesenteric artery and celiac axis. Tree-in-bud nodularity within the peripheral aspect of the lower lobes bilaterally.  12/28/2018 upper EUS-2.4 cm x 1.9 cm mass in the body of the pancreas causing main pancreatic duct obstruction and clearly involving the celiac trunk. Heterogeneous soft tissue mass in the neck measuring 3.6 cm. Fine-needle aspiration pancreas with malignant cells consistent with  adenocarcinoma.  01/16/2019 Port-A-Cath placement  01/17/2019 PET scan-suspected enlargement of the pancreatic mass with abnormal hypermetabolic activity at the junction of the pancreatic body and tail measuring 3.4 x 2.9 x 2.4 cm, thought to be a high likelihood tumor extension around the vicinity of the celiac trunk and possibly the superior mesenteric artery. Potential mild left periaortic adenopathy maximum SUV of the indistinct left periaortic lymph nodes approximately 3.3 which is above blood pool levels. Scattered groundglass density nodules in the lungs favoring the upper lobes with tree-in-bud nodularity in the lung bases favoring the lower lobes. None of the lesions are hypermetabolic.  Gemcitabine/Abraxane 01/24/2019-08/22/2019  04/16/2019 CT chest/abdomen/pelvis-margins of pancreatic neoplasm difficult to identify. Similar appearance of soft tissue infiltration within the retroperitoneum with progressive narrowing of the portal venous confluence by tumor. No specific findings of solid organ metastasis or nodal metastasis within the abdomen or pelvis. Unchanged appearance of multifocal subsolid nodules within the upper lung zones and bilateral lower lung zone tree-in-bud nodularity. Right lobe of thyroid gland nodule.  This  CT 08/29/2019-development of intra and extrahepatic biliary duct dilatation with abrupt cut off at the common bile duct, abnormal soft tissue in the retroperitoneum attenuates the portal vein and the splenic vein is occluded, fullness in the head of the pancreas appears more prominent without discrete margins, subtle 12 mm focus in the medial left liver   MRI liver 09/20/2019-heterogeneous hepatic hyperenhancement favored to represent perfusion anomalies in the setting of portal vein narrowing.  No convincing evidence of hepatic metastasis.  Locally advanced pancreatic carcinoma suboptimally evaluated.  Persistent mild intrahepatic biliary duct dilatation.  Subtle  peripancreatic edema.  CT chest 12/28/2019-single small embolus over a right lower lobar subsegmental pulmonary artery.  Moderate sized right pleural effusion with associated right basilar atelectasis.  Interval progression of multiple pulmonary nodules likely metastatic disease.  Indeterminate low-density foci over the right lobe of the liver with air-fluid levels.  Pneumobilia and partially visualized biliary stent.  CT abdomen/pelvis 12/25/2019-moderate right and small left pleural effusions.  Some pleural enhancement and thickening in the lower right hemithorax suggestive of malignant pleural involvement.  Numerous pulmonary nodules scattered throughout the visualized lung bases most compatible with progressive metastatic disease. Interval placement of a common bile duct stent, now with substantial intrahepatic biliary ductal dilatation, large amount of pneumatosis and multiple new low-attenuation liver lesions some of which have internal gas fluid levels highly concerning for multiple intra-abdominal abscesses and probable cholangitis.  Profound mural thickening and mucosal hyperenhancement in portions of the colon concerning for colitis.  Malignant right pleural effusion 12/06/2019 2. Abdominal/back pain secondary to #1 3. Port-A-Cath placement 01/16/2019 4. PE on chest CT 12/28/2019.  Anticoagulation on hold due to severe thrombocytopenia. 5. Right pleural effusion on chest CT 12/31/2019-thoracentesis 12/29/2019, cytology with malignant cells consistent with metastatic adenocarcinoma 6. Admission with sepsis syndrome secondary to biliary obstruction secondary by #1-status post ERCP with placement of a new bile duct stent 12/20/2019 7. Multiple liver lesions concerning for abscesses on CT 12/25/2019 8. Urine culture 12/27/2019 [facility]  Gabriel [name] appears unchanged.  We again discussed the recommendation for supportive/comfort care, hospice referral.  He plans to discuss this further with his  wife and his brother who is coming from out of town.  CBC from this morning reviewed.  Platelet count stable at 14,000.  There is no active bleeding.  Recommend discontinuation of blood draws if he is in agreement with hospice care.   Recommendations: 1.  Transition of care consult has been placed to initiate referral to hospice.  Family still discussing this option. 2.  Hold off on platelet transfusion unless active bleeding or prior to any procedure. 3.  Continue to hold anticoagulation and filter placement for now.   4.  Continue narcotic analgesics for pain.  He reports current regimen is effective for pain. 5.  Continue discussions regarding CODE STATUS via the palliative care and hospice teams  Please call as needed.   LOS: 11 days   Mikey Bussing, AGPCNP-BC 01/03/20  Gabriel [name] appears unchanged.  His pain appears adequately controlled with MS Contin and MSIR.  I recommend home hospice care as soon as this can be arranged.  I will plan to serve as the primary provider with the hospice team.

## 2020-01-04 DIAGNOSIS — Z515 Encounter for palliative care: Secondary | ICD-10-CM

## 2020-01-04 DIAGNOSIS — J9601 Acute respiratory failure with hypoxia: Secondary | ICD-10-CM

## 2020-01-04 DIAGNOSIS — R601 Generalized edema: Secondary | ICD-10-CM

## 2020-01-04 LAB — CBC WITH DIFFERENTIAL/PLATELET
Abs Immature Granulocytes: 0.1 10*3/uL — ABNORMAL HIGH (ref 0.00–0.07)
Basophils Absolute: 0 10*3/uL (ref 0.0–0.1)
Basophils Relative: 0 %
Eosinophils Absolute: 0 10*3/uL (ref 0.0–0.5)
Eosinophils Relative: 0 %
HCT: 30.9 % — ABNORMAL LOW (ref 39.0–52.0)
Hemoglobin: 10.5 g/dL — ABNORMAL LOW (ref 13.0–17.0)
Immature Granulocytes: 1 %
Lymphocytes Relative: 3 %
Lymphs Abs: 0.5 10*3/uL — ABNORMAL LOW (ref 0.7–4.0)
MCH: 27.6 pg (ref 26.0–34.0)
MCHC: 34 g/dL (ref 30.0–36.0)
MCV: 81.3 fL (ref 80.0–100.0)
Monocytes Absolute: 0.3 10*3/uL (ref 0.1–1.0)
Monocytes Relative: 2 %
Neutro Abs: 13.1 10*3/uL — ABNORMAL HIGH (ref 1.7–7.7)
Neutrophils Relative %: 94 %
Platelets: 22 10*3/uL — CL (ref 150–400)
RBC: 3.8 MIL/uL — ABNORMAL LOW (ref 4.22–5.81)
RDW: 14.5 % (ref 11.5–15.5)
WBC: 13.9 10*3/uL — ABNORMAL HIGH (ref 4.0–10.5)
nRBC: 0 % (ref 0.0–0.2)

## 2020-01-04 LAB — COMPREHENSIVE METABOLIC PANEL
ALT: 76 U/L — ABNORMAL HIGH (ref 0–44)
AST: 169 U/L — ABNORMAL HIGH (ref 15–41)
Albumin: 1.6 g/dL — ABNORMAL LOW (ref 3.5–5.0)
Alkaline Phosphatase: 490 U/L — ABNORMAL HIGH (ref 38–126)
Anion gap: 5 (ref 5–15)
BUN: 27 mg/dL — ABNORMAL HIGH (ref 6–20)
CO2: 25 mmol/L (ref 22–32)
Calcium: 7.3 mg/dL — ABNORMAL LOW (ref 8.9–10.3)
Chloride: 110 mmol/L (ref 98–111)
Creatinine, Ser: <0.3 mg/dL — ABNORMAL LOW (ref 0.61–1.24)
Glucose, Bld: 26 mg/dL — CL (ref 70–99)
Potassium: 3.5 mmol/L (ref 3.5–5.1)
Sodium: 140 mmol/L (ref 135–145)
Total Bilirubin: 3.7 mg/dL — ABNORMAL HIGH (ref 0.3–1.2)
Total Protein: 4.7 g/dL — ABNORMAL LOW (ref 6.5–8.1)

## 2020-01-04 LAB — GLUCOSE, CAPILLARY
Glucose-Capillary: 82 mg/dL (ref 70–99)
Glucose-Capillary: 44 mg/dL — CL (ref 70–99)
Glucose-Capillary: 139 mg/dL — ABNORMAL HIGH (ref 70–99)
Glucose-Capillary: 78 mg/dL (ref 70–99)

## 2020-01-04 MED ORDER — SALINE SPRAY 0.65 % NA SOLN
1.0000 | NASAL | Status: DC | PRN
  Administered 2020-01-04: 1 via NASAL
  Filled 2020-01-04: qty 44

## 2020-01-04 MED ORDER — SODIUM CHLORIDE 0.9 % IV BOLUS
250.0000 mL | Freq: Once | INTRAVENOUS | Status: AC
  Administered 2020-01-04: 250 mL via INTRAVENOUS

## 2020-01-04 MED ORDER — DEXTROSE 50 % IV SOLN
INTRAVENOUS | Status: AC
  Administered 2020-01-04: 50 mL
  Filled 2020-01-04: qty 50

## 2020-01-04 NOTE — Progress Notes (Signed)
MD notified POC CBG: 82 after critical value glucose of 26 received. Pt asymptomatic. Offered OJ, pt refusing. Pt has D5 NS running at 100 ml/hr currently. New orders for Q6 CBG.

## 2020-01-04 NOTE — Progress Notes (Signed)
OT Cancellation Note  Patient Details Name: Gabriel [name] MRN: [id number] DOB: 01/27/1981   Cancelled Treatment:    Reason Eval/Treat Not Completed: Other (comment) Per chart review POC to focus on comfort with likely home hospice D/C. Will discontinue acute OT services at this time.  Delbert Phenix OT OT pager: [place] 01/04/2020, 6:45 AM

## 2020-01-04 NOTE — Progress Notes (Signed)
CRITICAL VALUE ALERT  Critical Value:  44  Date & Time Notied:  01/04/20 1225   Provider Notified: Cyndia Skeeters  Orders Received/Actions taken: Hypoglycemic protocol followed. CBG better 139. Will continue to monitor.

## 2020-01-04 NOTE — Progress Notes (Signed)
Daily Progress Note   Patient Name: Gabriel [name]       Date: 01/04/2020 DOB: December 06, 1980  Age: 39 y.o. MRN#: [id number] Attending Physician: Mercy Riding, MD Primary Care Physician: Kerin Perna, NP Admit Date: 12/12/2019  Reason for Consultation/Follow-up: Establishing goals of care  Subjective:  not   awake alert this morning, wife at bedside, now with increased O2 requirements.    Length of Stay: 12  Current Medications: Scheduled Meds:  . sodium chloride   Intravenous Once  . sodium chloride   Intravenous Once  . Chlorhexidine Gluconate Cloth  6 each Topical Daily  . feeding supplement  1 Container Oral BID BM  . feeding supplement (KATE FARMS STANDARD 1.4)  325 mL Oral Daily  . indomethacin  100 mg Rectal Once  . metoprolol succinate  12.5 mg Oral Daily  . morphine  60 mg Oral Q12H  . multivitamin with minerals  1 tablet Oral Daily  . nystatin  5 mL Oral QID    Continuous Infusions: . ciprofloxacin 400 mg (01/04/20 0146)  . dextrose 5 % and 0.9% NaCl 100 mL/hr at 01/03/20 2043  . metronidazole 500 mg (01/04/20 0529)    PRN Meds: morphine, morphine injection, sodium chloride, sodium chloride flush  Physical Exam         Not alert not awake this morning.  No distress Appears frail weak Has muscle wasting No edema Regular   Vital Signs: BP (!) 82/69 (BP Location: Left Arm)   Pulse 82   Temp (!) 97.3 F (36.3 C) (Axillary)   Resp 16   Ht 5\' 8"  (1.727 m)   Wt 39.6 kg   SpO2 100%   BMI 13.27 kg/m  SpO2: SpO2: 100 % O2 Device: O2 Device: NRB O2 Flow Rate: O2 Flow Rate (L/min): 8 L/min  Intake/output summary:   Intake/Output Summary (Last 24 hours) at 01/04/2020 1156 Last data filed at 01/04/2020 0529 Gross per 24 hour  Intake 120 ml  Output 975 ml   Net -855 ml   LBM: Last BM Date: 01/04/20 Baseline Weight: Weight: 39.6 kg Most recent weight: Weight: 39.6 kg       Palliative Assessment/Data:    Flowsheet Rows     Most Recent Value  Intake Tab  Referral Department Hospitalist  Unit at Time of Referral  Med/Surg Unit  Palliative Care Primary Diagnosis Cancer  Date Notified 12/22/2019  Palliative Care Type New Palliative care  Reason for referral Clarify Goals of Care  Date of Admission 01/01/2020  Date first seen by Palliative Care 12/25/19  # of days Palliative referral response time 2 Day(s)  # of days IP prior to Palliative referral 0  Clinical Assessment  Palliative Performance Scale Score 50%  Psychosocial & Spiritual Assessment  Palliative Care Outcomes  Patient/Family meeting held? Yes  Who was at the meeting? Patient, fiancee, mother  Palliative Care Outcomes Clarified goals of care      Patient Active Problem List   Diagnosis Date Noted  . Protein-calorie malnutrition, severe 01/02/2020  . Thrombocytopenia ([place])   . Pressure injury of skin 12/26/2019  . Failure to thrive in adult 12/20/2019  . Sepsis secondary to UTI ([place]) 12/18/2019  . Pulmonary embolism ([place]) 12/12/2019  . Pleural effusion 12/16/2019  . Dilated bile duct   . Pancreatic mass   . Anemia due to antineoplastic chemotherapy 02/14/2019  . Family history of GI malignancy 02/14/2019  . Protein malnutrition ([place]) 01/24/2019  . Family history of breast cancer   . Family history of prostate cancer   . Cancer-related pain 01/10/2019  . Goals of care, counseling/discussion 01/10/2019  . Malignant neoplasm of head of pancreas ([place])   . Weight loss     Palliative Care Assessment & Plan   Patient Profile:  Gabriel [name] is a 39 year old male with PMHx metastatic pancreatic cancer on palliative chemotherapy with plan for radiation who was admitted with decreased appetite and weakness.  CT revealed subsegmental PE, moderate right effusion, liver  abscesses, and metastatic disease in lower lungs.  Palliative consulted for goals of care.    Assessment:  generalized weakness.  Generalized pain.  Functional decline.  Acute hypoxic resp failure  Recommendations/Plan:   discussed with wife that the patient appears to be actively dying, with markedly limited prognosis of hours to some very limited number of days. Hence, would recommend DNR DNI and comfort care.  Wife states that the patient's brother is coming from [place] later this evening, patient also has a 1 year old daughter who lives in [place] with her mother. he has 2 daughters. Discussed frankly but compassionately with wife that we now anticipate a hospital death and would not recommend home with  Hospice. She is tearful and appropriately grieving, how ever wishes to continue with current mode of care, awaiting arrival of additional family.    Code Status:    Code Status Orders  (From admission, onward)         Start     Ordered   12/22/2019 1729  Full code  Continuous        12/30/2019 1728        Code Status History    This patient has a current code status but no historical code status.   Advance Care Planning Activity       Prognosis:   guarded  Discharge Planning: Anticipated hospital death.  Care plan was discussed with wife    Thank you for allowing the Palliative Medicine Team to assist in the care of this patient.   Time In: 10 Time Out: 10.25 Total Time 25 Prolonged Time Billed  no       Greater than 50%  of this time was spent counseling and coordinating care related to the above assessment and plan.  Loistine Chance, MD  Please contact Palliative Medicine  Team phone at [phone] for questions and concerns.

## 2020-01-04 NOTE — Progress Notes (Signed)
PROGRESS NOTE  Gabriel [name] ELF:[id number] DOB: 06/06/1980   PCP: Kerin Perna, NP  Patient is from: Home  DOA: 12/18/2019 LOS: 44  Brief Narrative / Interim history: 39 year old male with history of pancreatic adenocarcinoma with SMA involvement s/p palliative chemotherapy with gemcitabine and Abraxane, biliary obstruction status post biliary stent in 09/2019 presenting with decreased appetite, generalized weakness and altered mental status, and admitted for sepsis in the setting of Klebsiella UTI and hepatic abscess, and moderate right-sided PE.  Per IR, patient is not a great candidate for PERC drain placement.  He was started on IV meropenem and transition to Cipro and Flagyl for a total of 14 days oncology recommended hospice.  TOC consulted for home hospice.  PMT consulted and following.    Subjective: No major events overnight.  He desaturated to 76% on 3 L this morning.  Recovered to 100% on NRB.  He appears weak but denies pain.  Poor p.o. intake.  Patient's mother and wife at bedside.  Extensive discussion about goal of care with patient and family at bedside as below.  Expecting patient's brother to arrive from [place] this evening.   Objective: Vitals:   01/03/20 0548 01/03/20 1329 01/04/20 0500 01/04/20 1000  BP: 102/74 94/71 100/69 (!) 82/69  Pulse: 89 85 80 82  Resp: 16 16 18 16   Temp: 97.9 F (36.6 C) (!) 97.5 F (36.4 C) 97.9 F (36.6 C) (!) 97.3 F (36.3 C)  TempSrc: Oral Axillary Axillary Axillary  SpO2: 100% 100% 100% 100%  Weight:      Height:        Intake/Output Summary (Last 24 hours) at 01/04/2020 1102 Last data filed at 01/04/2020 0529 Gross per 24 hour  Intake 120 ml  Output 975 ml  Net -855 ml   Filed Weights   12/24/19 1440  Weight: 39.6 kg    Examination:  GENERAL: Chronically ill-appearing.  Appears tired. HEENT: MMM.  Vision and hearing grossly intact.  Significant temporal wasting NECK: Supple.  No apparent JVD.  RESP: 100%  on NRB.  No IWOB.  Fair aeration bilaterally. CVS:  RRR. Heart sounds normal.  ABD/GI/GU: BS+. Abd soft, NTND.  MSK/EXT: Significant muscle mass and subcu fat loss.  Dependent edema SKIN: Stage II sacral decubitus NEURO: Awake and oriented appropriately.  No apparent focal neuro deficit. PSYCH: Calm. Normal affect.   Procedures:  9/23-ERCP showed near complete blockage of previously placed metallic stent.  Plastic biliary stent placed.   Microbiology summarized: COVID-19 PCR negative. Urine culture with 80,000 colonies of Klebsiella resistant to ampicillin  Assessment & Plan: Acute respiratory failure with hypoxia-desaturated to 76% on 3 L requiring NRB to maintain appropriate saturation.  Does not appear to have increased work of breathing.  He has known moderate bilateral pleural effusion even after thoracocentesis.  He also have acute PE although small and subsegmental.  He is not on anticoagulation due to severe thrombocytopenia.  He is also on significant dose of opiates which could cause respiratory depression.  He wishes to remain full code until his brother arrives from [place] this evening. -Continue supplemental oxygen.  Wean as able  Sepsis due to hepatic abscess/cholangitis and Klebsiella UTI  Biliary obstruction s/p biliary stent:  -ERCP and stent placement as above -Per discussion between previous attending and IR, not a candidate for PERC drain placement -IV meropenem>> Cipro and Flagyl on 9/24>> I doubt this would change the outcome.  Metastatic pancreatic cancer s/p palliative chemo with gemcitabine and Abraxane.  Multiple nodules noted on CT abdomen and pelvis. -Oncology recommended hospice.  TOC consulted for home hospice. Patient and family undecided -Pain control per oncology and palliative care  Elevated liver enzymes/alkaline phosphatase-likely due to the above. -Continue monitoring  Acute small subsegmental PE Portal vein and splenic vein thrombosis -Not a  candidate for anticoagulation due to severe thrombocytopenia -Oncology recommended holding off IVC filter placement  Ascites/anasarca/moderate bilateral pleural effusion -Likely due to severe malnutrition and malignancy  Severe thrombocytopenia: Likely consumptive. No obvious signs of bleeding.  Improved -Holding off platelet transfusion unless active bleeding -Hold off anticoagulation  SVT the night of 9/26.  Started on Toprol-XL which was held due to bradycardia  GOC: Extensive discussion with patient, patient's wife and patient's mother at bedside.  Patient with advanced metastatic pancreatic cancer and intrahepatic abscess. Per oncology, his cancer is terminal and recommended home hospice.  Per IR and surgery, not a candidate for surgical intervention even PERC drain placement.  Also with PE but not a candidate for anticoagulation due to severe thrombocytopenia.  Patient is getting weaker.  Now with significant respiratory failure requiring NRB.  He is a still full code.  I explained risk and benefit of resuscitation and intubation.  I believe these aggressive measures would pose more harm than benefit.  Patient and family voiced understanding this but would like to keep the CODE STATUS the same until his brother arrives, which will be this evening about 5:30 PM.  His brother is flying here from [place]  Severe malnutrition as evidenced by significant muscle mass and subcu fat loss, and anasarca Body mass index is 13.27 kg/m. Nutrition Problem: Severe Malnutrition Etiology: chronic illness, cancer and cancer related treatments Signs/Symptoms: severe fat depletion, severe muscle depletion Interventions: Boost Breeze, MVI, Other (Comment) [employer] 1.4 po)   Sacral decubitus ulcer: Stage II.  POA Pressure Injury 12/24/19 Sacrum Mid Stage 2 -  Partial thickness loss of dermis presenting as a shallow open injury with a red, pink wound bed without slough. (Active)  12/24/19 1430  Location:  Sacrum  Location Orientation: Mid  Staging: Stage 2 -  Partial thickness loss of dermis presenting as a shallow open injury with a red, pink wound bed without slough.  Wound Description (Comments):   Present on Admission: Yes   DVT prophylaxis:  SCDs Start: 12/06/2019 1729  Code Status: Full code Family Communication: Discussed with patient's mother and daughter at bedside.  Attempted to call patient's wife but no answer.  Status is: Inpatient  Remains inpatient appropriate because:Unsafe d/c plan, IV treatments appropriate due to intensity of illness or inability to take PO and Inpatient level of care appropriate due to severity of illness   Dispo:  Patient From: Home  Planned Disposition: Home Hospice  Expected discharge date: 01/04/20  Medically stable for discharge: No        Consultants:  Oncology Palliative medicine Interventional radiology   Sch Meds:  Scheduled Meds: . sodium chloride   Intravenous Once  . sodium chloride   Intravenous Once  . Chlorhexidine Gluconate Cloth  6 each Topical Daily  . feeding supplement  1 Container Oral BID BM  . feeding supplement (KATE FARMS STANDARD 1.4)  325 mL Oral Daily  . indomethacin  100 mg Rectal Once  . metoprolol succinate  12.5 mg Oral Daily  . morphine  60 mg Oral Q12H  . multivitamin with minerals  1 tablet Oral Daily  . nystatin  5 mL Oral QID   Continuous Infusions: .  ciprofloxacin 400 mg (01/04/20 0146)  . dextrose 5 % and 0.9% NaCl 100 mL/hr at 01/03/20 2043  . metronidazole 500 mg (01/04/20 0529)   PRN Meds:.morphine, morphine injection, sodium chloride, sodium chloride flush  Antimicrobials: Anti-infectives (From admission, onward)   Start     Dose/Rate Route Frequency Ordered Stop   12/28/19 1400  ciprofloxacin (CIPRO) IVPB 400 mg        400 mg 200 mL/hr over 60 Minutes Intravenous Every 12 hours 12/28/19 0910     12/28/19 1200  metroNIDAZOLE (FLAGYL) IVPB 500 mg        500 mg 100 mL/hr over 60  Minutes Intravenous Every 8 hours 12/28/19 0910     12/20/2019 2200  meropenem (MERREM) 1 g in sodium chloride 0.9 % 100 mL IVPB  Status:  Discontinued        1 g 200 mL/hr over 30 Minutes Intravenous Every 8 hours 12/25/2019 1730 12/28/19 0908   12/08/2019 2200  meropenem (MERREM) 1 g in sodium chloride 0.9 % 100 mL IVPB  Status:  Discontinued        1 g 200 mL/hr over 30 Minutes Intravenous Every 8 hours 12/25/2019 1706 12/08/2019 1730   12/12/2019 1730  meropenem (MERREM) 1 g in sodium chloride 0.9 % 100 mL IVPB  Status:  Discontinued        1 g 200 mL/hr over 30 Minutes Intravenous  Once 12/24/2019 1728 12/12/2019 1731   12/10/2019 1230  aztreonam (AZACTAM) 2 g in sodium chloride 0.9 % 100 mL IVPB        2 g 200 mL/hr over 30 Minutes Intravenous  Once 12/17/2019 1229 01/03/2020 1527   12/16/2019 1230  metroNIDAZOLE (FLAGYL) IVPB 500 mg        500 mg 100 mL/hr over 60 Minutes Intravenous  Once 12/13/2019 1229 12/24/2019 1400   12/16/2019 1230  vancomycin (VANCOCIN) IVPB 1000 mg/200 mL premix        1,000 mg 200 mL/hr over 60 Minutes Intravenous  Once 12/17/2019 1229 12/19/2019 1300       I have personally reviewed the following labs and images: CBC: Recent Labs  Lab 12/31/19 0237 01/01/20 0411 01/02/20 0422 01/03/20 0400 01/04/20 0411  WBC 18.8* 16.4* 15.0* 13.7* 13.9*  NEUTROABS 18.6* 15.4* 13.8* 12.7* 13.1*  HGB 10.1* 9.9* 10.6* 8.9* 10.5*  HCT 30.2* 29.5* 31.5* 26.8* 30.9*  MCV 83.7 82.4 81.0 81.7 81.3  PLT 51* 11* 13* 14* 22*   BMP &GFR Recent Labs  Lab 12/31/19 0237 01/01/20 0411 01/02/20 0422 01/03/20 0400 01/04/20 0411  NA 136 137 138 137 140  K 3.7 3.9 3.9 3.7 3.5  CL 104 107 107 108 110  CO2 26 26 26 24 25   GLUCOSE 89 83 102* 103* 26*  BUN 21* 22* 23* 23* 27*  CREATININE 0.30* 0.34* <0.30* <0.30* <0.30*  CALCIUM 7.5* 7.4* 7.5* 7.1* 7.3*  MG  --  1.7  --   --   --    CrCl cannot be calculated (This lab value cannot be used to calculate CrCl because it is not a number: <0.30). Liver  & Pancreas: Recent Labs  Lab 12/31/19 0237 01/01/20 0411 01/02/20 0422 01/03/20 0400 01/04/20 0411  AST 76* 156* 102* 73* 169*  ALT 61* 83* 82* 65* 76*  ALKPHOS 408* 425* 433* 414* 490*  BILITOT 3.3* 2.4* 2.8* 2.5* 3.7*  PROT 5.0* 4.9* 5.2* 4.4* 4.7*  ALBUMIN 1.8* 1.7* 1.7* 1.5* 1.6*   No results for input(s): LIPASE, AMYLASE  in the last 168 hours. No results for input(s): AMMONIA in the last 168 hours. Diabetic: No results for input(s): HGBA1C in the last 72 hours. Recent Labs  Lab 01/04/20 0501  GLUCAP 82   Cardiac Enzymes: No results for input(s): CKTOTAL, CKMB, CKMBINDEX, TROPONINI in the last 168 hours. No results for input(s): PROBNP in the last 8760 hours. Coagulation Profile: No results for input(s): INR, PROTIME in the last 168 hours. Thyroid Function Tests: No results for input(s): TSH, T4TOTAL, FREET4, T3FREE, THYROIDAB in the last 72 hours. Lipid Profile: No results for input(s): CHOL, HDL, LDLCALC, TRIG, CHOLHDL, LDLDIRECT in the last 72 hours. Anemia Panel: No results for input(s): VITAMINB12, FOLATE, FERRITIN, TIBC, IRON, RETICCTPCT in the last 72 hours. Urine analysis:    Component Value Date/Time   COLORURINE AMBER (A) 12/12/2019 1103   APPEARANCEUR CLEAR 12/14/2019 1103   LABSPEC 1.017 12/16/2019 1103   PHURINE 5.0 12/19/2019 1103   GLUCOSEU NEGATIVE 12/28/2019 1103   HGBUR MODERATE (A) 12/22/2019 1103   BILIRUBINUR NEGATIVE 12/09/2019 [place] 12/30/2019 1103   PROTEINUR NEGATIVE 12/31/2019 1103   NITRITE NEGATIVE 12/09/2019 1103   LEUKOCYTESUR SMALL (A) 12/19/2019 1103   Sepsis Labs: Invalid input(s): PROCALCITONIN, [place]  Microbiology: No results found for this or any previous visit (from the past 240 hour(s)).  Radiology Studies: No results found.    Reghan Thul T. [place]  If 7PM-7AM, please contact night-coverage [email or web address] 01/04/2020, 11:02 AM

## 2020-01-04 NOTE — Progress Notes (Signed)
MD at bedside with pt and family. Discussed with family pt condition. Pt yellow MEWS. Pt code status will remain FULL at this time. PT BP 75/58, HR 75. New orders for 250 ml NS bolus.

## 2020-01-04 NOTE — Plan of Care (Signed)
Called to bedside by RN that pt's family wants to discuss code status. Wife Shirlean M[employer]ar [employer]) and brother Alic Hilburn ) asked how long it will take before pt dies. Told them could be any moment from now to few days and that should pt cardiac arrest, he will not survive resuscitation measures. Also discussed pt's poor hemodynamics (BP 70s and complaint of  difficulty breathing) as objective signs of pt declining. Wife and pt's brother asked me to give them sometime to think and will let me know.  About an hour later, they asked me to change him to DNR to make pt comfortable because they did not want him to suffer. Code status changed to DNR per family's request.

## 2020-01-04 NOTE — Progress Notes (Signed)
Checked pt BP prior to giving scheduled MS Contin. BP 76/65, HR 60, SpO2:94 via 4L [place], RR: 22. Pt alert with family at bedside. Held med and notified MD. Awaiting new orders.

## 2020-01-04 NOTE — Progress Notes (Signed)
CRITICAL VALUE ALERT  Critical Value:  Glucose: 26  Date & Time Notied:  01/04/20 0452  Provider Notified: Sharlet Salina @ [phone]  Orders Received/Actions taken: awaiting new orders

## 2020-01-04 NOTE — Progress Notes (Signed)
Patient reassessed this am, VS 97.3, 82, 16,  76 % on 3 lt [place]. Attempt made to increase [place] O2 unsuccessful. Nonrebreather applied, O2 sat increased to 100 %. Will continue to monitor. Provider updated.

## 2020-01-04 DEATH — deceased

## 2020-01-05 DIAGNOSIS — J9621 Acute and chronic respiratory failure with hypoxia: Secondary | ICD-10-CM

## 2020-01-05 MED ORDER — GLYCOPYRROLATE 0.2 MG/ML IJ SOLN
0.4000 mg | INTRAMUSCULAR | Status: DC | PRN
  Filled 2020-01-05: qty 2

## 2020-01-05 MED ORDER — LORAZEPAM 2 MG/ML IJ SOLN: 2.0000 mg | INTRAMUSCULAR | Status: DC | PRN

## 2020-01-09 ENCOUNTER — Encounter (INDEPENDENT_AMBULATORY_CARE_PROVIDER_SITE_OTHER): Payer: Self-pay | Admitting: Primary Care

## 2020-01-18 ENCOUNTER — Ambulatory Visit: Payer: Medicaid Other

## 2020-01-18 ENCOUNTER — Ambulatory Visit: Payer: Medicaid Other | Admitting: Radiation Oncology

## 2020-01-21 ENCOUNTER — Other Ambulatory Visit (HOSPITAL_COMMUNITY): Payer: Medicaid Other

## 2020-01-24 ENCOUNTER — Ambulatory Visit (HOSPITAL_COMMUNITY): Admit: 2020-01-24 | Payer: Medicaid Other | Admitting: Gastroenterology

## 2020-01-24 SURGERY — UPPER ENDOSCOPIC ULTRASOUND (EUS) RADIAL
Anesthesia: Monitor Anesthesia Care

## 2020-01-29 ENCOUNTER — Ambulatory Visit: Payer: Medicaid Other

## 2020-01-29 ENCOUNTER — Ambulatory Visit: Payer: Medicaid Other | Admitting: Radiation Oncology

## 2020-02-04 NOTE — Progress Notes (Addendum)
Wife refused CGB. States "I don't want him stuck and poked anymore."

## 2020-02-04 NOTE — Progress Notes (Signed)
MD notified pt expired. TOD 0117. No heart sounds upon auscultation, no palpable carotid pulse and pupils non-reactive to light.

## 2020-02-04 NOTE — Progress Notes (Signed)
BP 76/39 HR 48 after 290ml bolus. MD made aware.

## 2020-02-04 NOTE — Progress Notes (Signed)
MD notified pt BP: 54/42 HR:57 SpO2 88%. Pt RED MEWS.

## 2020-02-04 NOTE — Progress Notes (Signed)
Pt now comfort measures. Family at bedside.

## 2020-02-04 NOTE — Death Summary Note (Signed)
DEATH SUMMARY   Patient Details  Name: Gabriel [name] MRN: [id number] DOB: 10-04-1980  Admission/Discharge Information   Admit Date:  12-28-2019  Date of Death: Date of Death: January 10, 2020 (Simultaneous filing. User may not have seen previous data.)  Time of Death: Time of Death: 0117 (Simultaneous filing. User may not have seen previous data.)  Length of Stay: 13  Referring Physician: Kerin Perna, NP   Reason(s) for Hospitalization  Decreased appetite, generalized weakness and altered mental status,  Diagnoses  Preliminary cause of death: Acute hypoxemic respiratory failure ([facility]) Secondary Diagnoses (including complications and co-morbidities):  Principal Problem:   Sepsis secondary to UTI [facility]) Active Problems:   Malignant neoplasm of head of pancreas ([place])   Cancer-related pain   Failure to thrive in adult   Pulmonary embolism ([facility])   Pleural effusion   Pressure injury of skin   Thrombocytopenia ([place])   Protein-calorie malnutrition, severe   Dying care   Acute on chronic respiratory failure with hypoxia [facility])   [facility] Course (including significant findings, care, treatment, and services provided and events leading to death)  Gabriel [name] is a 39 y.o. year old male with history of pancreatic adenocarcinoma with SMA involvement s/p palliative chemotherapy with gemcitabine and Abraxane, biliary obstruction status post biliary stent in 09/2019 presenting with decreased appetite, generalized weakness and altered mental status, and admitted for sepsis in the setting of Klebsiella UTI and hepatic abscess, and moderate right-sided PE.  Per IR, patient is not a great candidate for PERC drain placement.  He was started on IV meropenem and transitioned to Cipro and Flagyl for a total of 14 days oncology recommended hospice given grim prognosis.   Palliative medicine consulted.  Eventually, patient deconditioned.  He was transitioned to full comfort care few hours before  he died with family is at bedside.   Acute respiratory failure with hypoxia-desaturated to 76% on 3 L requiring NRB to maintain appropriate saturation.  Does not appear to have increased work of breathing.  He has known moderate bilateral pleural effusion even after thoracocentesis.  He also have acute PE although small and subsegmental.  He is not on anticoagulation due to severe thrombocytopenia.  He is also on significant dose of opiates which could cause respiratory depression.  He wishes to remain full code until his brother arrives from [place] this evening. -Continue supplemental oxygen.  Wean as able  Sepsis due to hepatic abscess/cholangitis and Klebsiella UTI  Biliary obstruction s/p biliary stent:  -ERCP and stent placement as above -Per discussion between previous attending and IR, not a candidate for PERC drain placement -IV meropenem>> Cipro and Flagyl on 9/24>> I doubt this would change the outcome.  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   Pulmonary nodules-noted on CT abdomen and pelvis. -Oncology recommended hospice.  TOC consulted for home hospice. Patient and family undecided -Pain control per oncology and palliative care  Elevated liver enzymes/alkaline phosphatase-likely due to the above. 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 No obvious signs of bleeding.  Improved -Holding off platelet transfusion unless active bleeding -Hold off anticoagulation  SVT the night of 9/26.  Started on Toprol-XL which was held due to bradycardia  Severe malnutrition: As  evidenced by significant muscle mass and subcu fat loss, anasarca and low BMI Body mass index is 13.27 kg/m.  End-of-life care/DNR/DNI  Pertinent Labs and Studies  Significant Diagnostic Studies DG Chest 2 View  Result Date: 12/30/2019 CLINICAL DATA:  Pancreatic cancer with right-sided pleural effusion. EXAM: CHEST - 2 VIEW COMPARISON:  December 27, 2019 FINDINGS: Injectable port in stable position. Cardiomediastinal silhouette is normal. Mediastinal contours appear intact. Stable right pleural effusion. Multiple nodular opacities, predominantly in the lung bases persist. Osseous structures are without acute abnormality. Soft tissues are grossly normal. IMPRESSION: Stable moderate right pleural effusion. Stable appearance of multiple nodular opacities throughout both lungs. Electronically Signed   By: Fidela Salisbury M.D.   On: 12/30/2019 10:56   CT Head Wo Contrast  Result Date: 12/14/2019 CLINICAL DATA:  Pancreatic cancer, chemotherapy, weakness, headache EXAM: CT HEAD WITHOUT CONTRAST TECHNIQUE: Contiguous axial images were obtained from the base of the skull through the vertex without intravenous contrast. COMPARISON:  None. FINDINGS: Brain: No evidence of acute infarction, hemorrhage, hydrocephalus, extra-axial collection or mass lesion/mass effect. Vascular: No hyperdense vessel or unexpected calcification. Skull: Normal. Negative for fracture or focal lesion. Sinuses/Orbits: No acute finding. Other: None. IMPRESSION: No acute intracranial pathology. No non-contrast CT evidence of intracranial metastatic disease or findings to explain headache. Contrast enhanced MRI may be used to more sensitively evaluate for intracranial metastatic disease if desired. Electronically Signed   By: Eddie Candle M.D.   On: 12/31/2019 11:30   CT Angio Chest PE W and/or Wo Contrast  Result Date: 12/29/2019 CLINICAL DATA:  Shortness of breath and chest pain. Suspect pulmonary embolism. History of pancreatic  cancer on chemotherapy. EXAM: CT ANGIOGRAPHY CHEST WITH CONTRAST TECHNIQUE: Multidetector CT imaging of the chest was performed using the standard protocol during bolus administration of intravenous contrast. Multiplanar CT image reconstructions and MIPs were obtained to evaluate the vascular anatomy. CONTRAST:  141mL OMNIPAQUE IOHEXOL 350 MG/ML SOLN COMPARISON:  08/29/2019 FINDINGS: Cardiovascular: Right IJ central venous catheter is present with tip over the cavoatrial junction. Heart is normal size. Thoracic aorta is normal caliber. Pulmonary arterial system is well opacified and demonstrates a single small embolus over a right lower lobar subsegmental pulmonary artery. No left-sided pulmonary emboli. Mediastinum/Nodes: No evidence of mediastinal or hilar adenopathy. Remaining mediastinal structures are unremarkable. Lungs/Pleura: Lungs are adequately inflated demonstrate a moderate size right pleural effusion with associated atelectasis in the right base. There are numerous small bilateral pulmonary nodules with interval progression likely worsening metastatic disease. Airways are unremarkable. Upper Abdomen: Not well evaluated on this arterial phase CT scan. Suggestion of partial visualization of patient's known biliary stent. Moderate pneumobilia is present. Couple low-density foci over the right lobe of the liver with air-fluid levels of uncertain clinical significance. Musculoskeletal: No focal abnormality. Review of the MIP images confirms the above findings. IMPRESSION: 1. Single small embolus over a right lower lobar subsegmental pulmonary artery. 2. Moderate size right pleural effusion with associated right basilar atelectasis. 3. Interval progression of multiple pulmonary nodules likely metastatic disease due to patient's known pancreatic cancer. 4. Couple indeterminate low-density foci over the right lobe of the liver with air-fluid levels of not well evaluated on this arterial phase contrast CT.  Pneumobilia and partially visualized biliary stent. Recommend clinical correlation as further evaluation with venous phase contrast enhanced CT of the abdomen/pelvis would be helpful. Critical Value/emergent results were called by telephone at the time of interpretation on 12/22/2019 at 3:08 pm to provider [facility] RAY , who verbally  acknowledged these results. Electronically Signed   By: Marin Olp M.D.   On: 12/28/2019 15:10   CT ABDOMEN PELVIS W CONTRAST  Result Date: 12/31/2019 CLINICAL DATA:  Adnexal past.  History of pancreatic cancer. EXAM: CT ABDOMEN AND PELVIS WITH CONTRAST TECHNIQUE: Multidetector CT imaging of the abdomen and pelvis was performed using the standard protocol following bolus administration of intravenous contrast. CONTRAST:  189mL OMNIPAQUE IOHEXOL 300 MG/ML  SOLN COMPARISON:  December 25, 2019. FINDINGS: Lower chest: Moderate bilateral pleural effusions are noted with multiple nodule seen involving the visualized lung bases consistent with metastatic disease. Hepatobiliary: Continued presence of stent within common bile duct. There has been placement of a nother stent within the common bile duct that extends from cavitary abnormality in upper pole of right kidney and through common bile duct into the duodenum. No gallstones are noted. Extensive pneumobilia is again noted. There remains multiple rounded low densities some of which with air-fluid levels, concerning for hepatic abscesses. The largest measures 4.3 cm inferiorly in the right hepatic lobe. Pancreas: There is again noted pancreatic head mass which is poorly defined. It appears to surround portions of the celiac and superior mesenteric arteries. It is difficult to determine the size given its ill-defined and infiltrative nature. Spleen: Normal in size without focal abnormality. Adrenals/Urinary Tract: Adrenal glands and kidneys appear normal. No hydronephrosis or renal obstruction is noted. Moderate urinary bladder  distention is noted. Stomach/Bowel: Stomach is within normal limits. Appendix appears normal. No evidence of bowel wall thickening, distention, or inflammatory changes. Vascular/Lymphatic: Atherosclerosis of abdominal aorta is noted without aneurysm or dissection. As noted above, there appears to be extension of the pancreatic mass around the vasculature of the proximal abdominal aorta and celiac and superior mesenteric branches. No discrete adenopathy is noted. 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 Reproductive: Prostate is unremarkable. Other: Significantly increased anasarca is noted. Mildly increased ascites is noted as well. Musculoskeletal: No acute or significant osseous findings. IMPRESSION: 1. 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 Significantly increased ascites and anasarca is noted compared to prior exam. These results will be called to the ordering clinician or representative by the Radiologist Assistant, and communication documented in the PACS or zVision Dashboard. 2. Moderate bilateral pleural effusions are noted with multiple nodules seen involving the visualized lung bases consistent with metastatic disease. 3. Continued presence of stent within common bile duct. There has been placement of another stent within the common bile duct that extends from cavitary abnormality in upper pole of right kidney and through common bile duct into the duodenum. Extensive pneumobilia is again noted. 4. Multiple rounded low densities are noted some of which with air-fluid levels, concerning for hepatic abscesses. The largest measures 4.3 cm inferiorly in the right hepatic lobe. 5. There is again noted pancreatic head mass which is poorly defined. It is difficult to determine the size given its ill-defined and infiltrative nature. It appears to surround portions of the celiac and superior mesenteric arteries. 6.  Moderate urinary bladder distention is noted. 7. Aortic atherosclerosis. Aortic Atherosclerosis (ICD10-I70.0). Electronically Signed   By: Marijo Conception M.D.   On: 12/31/2019 15:58   CT ABDOMEN PELVIS W CONTRAST  Result Date: 12/25/2019 CLINICAL DATA:  39 year old male with history of abdominal pain. Suspected abscess. EXAM: CT ABDOMEN AND PELVIS WITH CONTRAST TECHNIQUE: Multidetector CT imaging of the abdomen and pelvis was performed using  the standard protocol following bolus administration of intravenous contrast. CONTRAST:  66mL OMNIPAQUE IOHEXOL 300 MG/ML  SOLN COMPARISON:  CT the abdomen and pelvis 08/29/2019. FINDINGS: Lower chest: Moderate right and small left pleural effusions lying dependently. Some pleural enhancement and thickening in the lower right hemithorax suggestive of malignant pleural involvement. Numerous pulmonary nodules scattered throughout the visualize lung bases, including areas of confluent nodularity in the lower lobes of the lungs bilaterally, most compatible with progressive metastatic disease. Hepatobiliary: Compared to the prior study there are numerous low-attenuation lesions within the liver, highly suspicious for multifocal intrahepatic abscesses (less likely to represent centrally necrotic metastatic lesions). The largest of these has internal septations and measures up to 3.6 x 3.5 cm in segment 5 (axial image 30 of series 2). One of these lesions in segment 7/8 of the liver has some non dependent gas, strongly favoring abscess. Extensive intrahepatic biliary ductal dilatation with a large volume of pneumobilia. Gallbladder is unremarkable in appearance. Interval placement of common bile duct stent which appears appropriately located. Pancreas: Poorly defined pancreatic mass in the superior aspect of the head of the pancreas (soft tissues in the superior aspect of the head of the pancreas are somewhat indistinct, potentially treatment related). Haziness in the  peripancreatic fat, including soft tissue around portions of the celiac axis, common hepatic artery and proximal superior mesenteric artery, as well as the left renal vein, all of which appear narrowed, likely indicative of locally infiltrative tumor. This lesion is difficult to definitively identify and measure, but is estimated to measure approximately 4.4 x 3.0 cm (axial image 27 of series 2). Spleen: The appearance of the spleen is unremarkable. Adrenals/Urinary Tract: Bilateral kidneys and adrenal glands are normal in appearance. No hydroureteronephrosis. Urinary bladder is unremarkable in appearance. Stomach/Bowel: The appearance of the stomach is unremarkable. No pathologic dilatation of small bowel or colon. Portions of the colon appear thickened with some mucosal hyperenhancement (poorly evaluated given the adjacent ascites), best appreciated in the region of the cecum, ascending colon and hepatic flexure, suggestive of underlying colitis. Appendix is not confidently identified. Vascular/Lymphatic: Aortic atherosclerosis, without evidence of aneurysm or dissection in the abdominal or pelvic vasculature. Vascular involvement from the pancreatic mass, discussed above. Poorly defined infiltrative soft tissue throughout the retroperitoneum adjacent to the pancreatic mass, with soft tissue completely encircling the upper abdominal aorta immediately below the level of the renal arteries (axial image 30 of series 2), presumably indicative of lymphatic spread of disease. Reproductive: Prostate gland and seminal vesicles are unremarkable in appearance. Other: Large volume of ascites. No pneumoperitoneum. Diffuse body wall edema. Musculoskeletal: There are no aggressive appearing lytic or blastic lesions noted in the visualized portions of the skeleton. IMPRESSION: 1. Today's study demonstrates what appears to be progression of disease with innumerable metastatic lesions throughout the visualize lung bases, probable  malignant moderate right pleural effusion and small left pleural effusion, enlargement of the primary pancreatic mass now with vascular involvement, and what appears to be evolving retroperitoneal lymphadenopathy encasing the abdominal aorta. 2. In addition, there has been interval placement of a common bile duct stent, now with substantial intrahepatic biliary ductal dilatation, large amount of pneumatosis, and multiple new low-attenuation liver lesions, some of which have internal gas fluid levels, highly concerning for multiple intra-abdominal abscesses and probable cholangitis. 3. Profound mural thickening and mucosal hyperenhancement in portions of the colon (predominantly cecum, ascending colon and hepatic flexure), concerning for colitis. 4. Aortic atherosclerosis. 5. Additional incidental findings, as above. Electronically Signed  By: Vinnie Langton M.D.   On: 12/25/2019 14:50   DG CHEST PORT 1 VIEW  Result Date: 12/31/2019 CLINICAL DATA:  History of metastatic pancreatic cancer. History of cholangitis and hepatic abscesses. Pleural effusion. EXAM: PORTABLE CHEST 1 VIEW COMPARISON:  12/30/2019. FINDINGS: PowerPort catheter stable position. Heart size normal. Multiple nodular opacities are again noted throughout both lungs. Persistent bibasilar atelectasis and or infiltrates. Moderate right pleural effusion slightly increased from prior exam noted. No pneumothorax. Biliary stent right upper quadrant again noted. IMPRESSION: 1. Moderate right pleural effusion, slightly increased from prior exam. 2. Multiple nodular opacities again noted throughout both lungs. Persistent bibasilar atelectasis and or infiltrates. Electronically Signed   By: Marcello Moores  Register   On: 12/31/2019 10:48   DG Chest Port 1 View  Result Date: 12/24/2019 CLINICAL DATA:  39 year old male, status post thoracentesis EXAM: PORTABLE CHEST 1 VIEW COMPARISON:  Chest CT 01/02/2020, chest x-ray 12/23/2018 FINDINGS: Cardiomediastinal  silhouette unchanged in size and contour. Right IJ port catheter unchanged. Micro nodular pattern of opacity throughout the lungs, compatible with findings on the prior CT. Blunting of the right costophrenic angle persists, with no pneumothorax. No left-sided pleural fluid.  No new confluent airspace disease. IMPRESSION: No complicating features status post right-sided thoracentesis, with persistent small fluid/atelectasis at the right lung base. Micro nodular pattern of opacity of the bilateral lungs compatible with findings on the recent prior CT chest. Unchanged right IJ port catheter. Electronically Signed   By: Corrie Mckusick D.O.   On: 12/21/2019 16:04   DG Chest Port 1 View  Result Date: 12/10/2019 CLINICAL DATA:  Pancreatic cancer, weakness EXAM: PORTABLE CHEST 1 VIEW COMPARISON:  08/29/2019 chest CT. FINDINGS: Right internal jugular Port-A-Cath terminates at the cavoatrial junction. Normal heart size. Normal mediastinal contour. No pneumothorax. New small right pleural effusion. No left pleural effusion. No pulmonary edema. Hazy patchy right lung base opacity. Indistinct tiny nodular opacities scattered in the lower lungs bilaterally. IMPRESSION: 1. New small right pleural effusion. 2. Hazy patchy right lung base opacity, favor atelectasis. 3. Indistinct tiny nodular opacities scattered in the lower lungs bilaterally, as seen on prior chest CT, cannot exclude metastatic disease. Electronically Signed   By: Ilona Sorrel M.D.   On: 12/29/2019 11:40   DG ERCP  Result Date: 12/23/2019 CLINICAL DATA:  Pancreas cancer, cholangitis, sepsis, stent occlusion EXAM: ERCP with common bile duct stent revision TECHNIQUE: Multiple spot images obtained with the fluoroscopic device and submitted for interpretation post-procedure. FLUOROSCOPY TIME:  Fluoroscopy Time:  10 minutes 37 seconds Radiation Exposure Index (if provided by the fluoroscopic device): Dense 110 mGy Number of Acquired Spot Images: Numerous spot  fluoroscopic view COMPARISON:  12/25/2019 FINDINGS: Spot fluoroscopic views during ERCP confirm wire and catheter traversing the biliary metallic stent. Proximal contrast injection above the stent confirms biliary obstruction from stent occlusion. Endoscopic plastic biliary stent now traverses the previous metallic stent. 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 Successful plastic endoscopic stent placement traversing the proximal biliary tree through the CBD metallic stent. These images were submitted for radiologic interpretation only. Please see the procedural report for the amount of contrast and the fluoroscopy time utilized. Electronically Signed   By: Jerilynn Mages.  Shick M.D.   On: 12/26/2019 13:05   US THORACENTESIS ASP PLEURAL SPACE W/IMG GUIDE  Result Date: 12/16/2019 INDICATION: Patient with history of pancreatic cancer, failure to thrive, and right pleural effusion. Request made for diagnostic and therapeutic right thoracentesis EXAM: ULTRASOUND GUIDED DIAGNOSTIC AND THERAPEUTIC RIGHT  THORACENTESIS MEDICATIONS: 8 mL 1% lidocaine COMPLICATIONS: None immediate. PROCEDURE: An ultrasound guided thoracentesis was thoroughly discussed with the patient and questions answered. The benefits, risks, alternatives and complications were also discussed. The patient understands and wishes to proceed with the procedure. Written consent was obtained. Ultrasound was performed to localize and mark an adequate pocket of fluid in the right chest. The area was then prepped and draped in the normal sterile fashion. 1% Lidocaine was used for local anesthesia. Under ultrasound guidance a 6 Fr Safe-T-Centesis catheter was introduced. Thoracentesis was performed. The catheter was removed and a dressing applied. FINDINGS: A total of approximately 850 mL of dark red fluid was removed. Samples were sent to the laboratory as requested by the clinical team. IMPRESSION: Successful ultrasound guided  right thoracentesis yielding 850 mL of pleural fluid. Read by: Earley Abide, PA-C Electronically Signed   By: Corrie Mckusick D.O.   On: 12/08/2019 16:00    Microbiology No results found for this or any previous visit (from the past 240 hour(s)).  Lab Basic Metabolic Panel: Recent Labs  Lab 12/31/19 0237 01/01/20 0411 01/02/20 0422 01/03/20 0400 01/04/20 0411  NA 136 137 138 137 140  K 3.7 3.9 3.9 3.7 3.5  CL 104 107 107 108 110  CO2 26 26 26 24 25   GLUCOSE 89 83 102* 103* 26*  BUN 21* 22* 23* 23* 27*  CREATININE 0.30* 0.34* <0.30* <0.30* <0.30*  CALCIUM 7.5* 7.4* 7.5* 7.1* 7.3*  MG  --  1.7  --   --   --    Liver Function Tests: Recent Labs  Lab 12/31/19 0237 01/01/20 0411 01/02/20 0422 01/03/20 0400 01/04/20 0411  AST 76* 156* 102* 73* 169*  ALT 61* 83* 82* 65* 76*  ALKPHOS 408* 425* 433* 414* 490*  BILITOT 3.3* 2.4* 2.8* 2.5* 3.7*  PROT 5.0* 4.9* 5.2* 4.4* 4.7*  ALBUMIN 1.8* 1.7* 1.7* 1.5* 1.6*   No results for input(s): LIPASE, AMYLASE in the last 168 hours. No results for input(s): AMMONIA in the last 168 hours. CBC: Recent Labs  Lab 12/31/19 0237 01/01/20 0411 01/02/20 0422 01/03/20 0400 01/04/20 0411  WBC 18.8* 16.4* 15.0* 13.7* 13.9*  NEUTROABS 18.6* 15.4* 13.8* 12.7* 13.1*  HGB 10.1* 9.9* 10.6* 8.9* 10.5*  HCT 30.2* 29.5* 31.5* 26.8* 30.9*  MCV 83.7 82.4 81.0 81.7 81.3  PLT 51* 11* 13* 14* 22*   Cardiac Enzymes: No results for input(s): CKTOTAL, CKMB, CKMBINDEX, TROPONINI in the last 168 hours. Sepsis Labs: Recent Labs  Lab 01/01/20 0411 01/02/20 0422 01/03/20 0400 01/04/20 0411  WBC 16.4* 15.0* 13.7* 13.9*    Procedures/Operations  Right-sided thoracocentesis   Brandn Mcgath T Adore Kithcart 01/21/2020, 6:07 PM

## 2020-02-04 DEATH — deceased

## 2020-09-15 IMAGING — MR MR ABDOMEN WO/W CM
20 series · 48 of 48 positions shown · IV contrast (with contrast)
Comparison: 08/29/2019 and 04/16/2019.

CLINICAL DATA: Known pancreatic adenocarcinoma. Hyperenhancing
lesion in the medial segment left liver lobe.

EXAM:
MRI ABDOMEN WITHOUT AND WITH CONTRAST
TECHNIQUE: Multiplanar multisequence MR imaging of the abdomen was performed
both before and after the administration of intravenous contrast.
CONTRAST:  5mL GADAVIST GADOBUTROL 1 MMOL/ML IV SOLN

[Series 2: haste_cor_mbh · coronal · 6.0mm · 1.56mm/px · 1 of 29 slices shown]
[im 1/29]
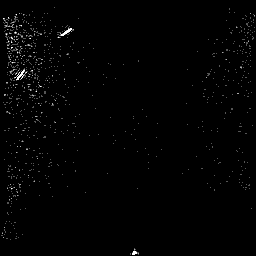

[Series 3: ax_trufi_mbh · axial · 6.0mm · 0.81mm/px · 1 of 38 slices shown]
[im 1/38]
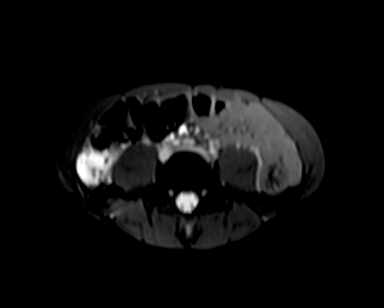

[Series 4: T2 fat-sat · axial · 6.0mm · 1.16mm/px · 1 of 36 slices shown]
[im 1/36]
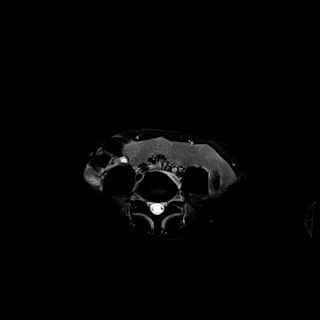

[Series 6: ax_diff_fb_tracew_dfc_mix · axial · 6.0mm · 1.36mm/px · z∈[-236,+2]mm · 2 of 68 slices shown]
[im 1/68]
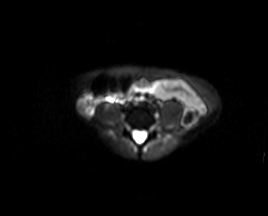
[im 68/68]
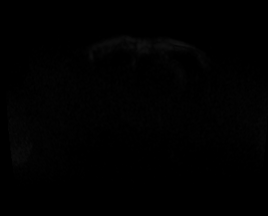

[Series 7: ax_diff_fb_adc_dfc_mix · axial · 6.0mm · 1.36mm/px · 1 of 34 slices shown]
[im 1/34]
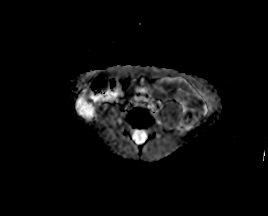

[Series 8: t1_vibe_e-dixon_tra_bh_pre_opp · axial · 3.0mm · 1.25mm/px · z∈[-224,-11]mm · 3 of 72 slices shown]
[im 1/72]
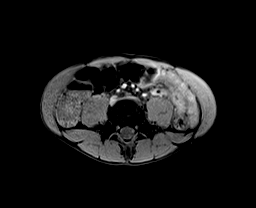
[im 36/72]
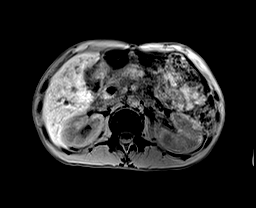
[im 72/72]
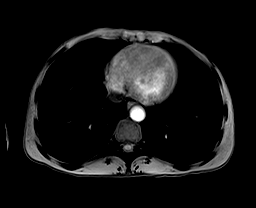

[Series 9: t1_vibe_e-dixon_tra_bh_pre_in · axial · 3.0mm · 1.25mm/px · z∈[-224,-11]mm · 3 of 72 slices shown]
[im 1/72]
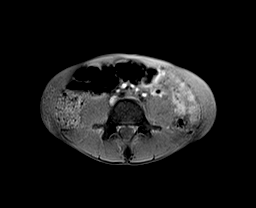
[im 36/72]
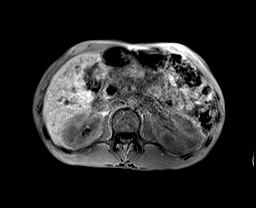
[im 72/72]
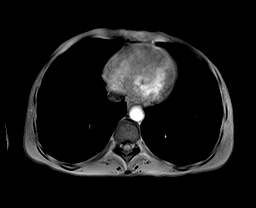

[Series 22: t1_vibe_e-dixon_tra_bh_pre_in_reg · axial · 3.0mm · 1.25mm/px · z∈[-224,-11]mm · 3 of 72 slices shown]
[im 1/72]
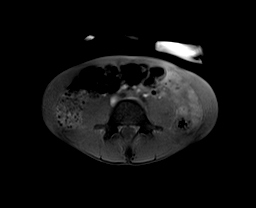
[im 36/72]
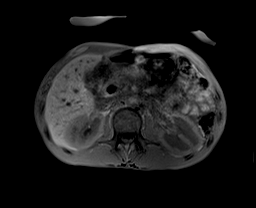
[im 72/72]
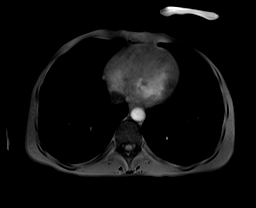

[Series 23: t1_vibe_e-dixon_tra_bh_pre_opp_reg · axial · 3.0mm · 1.25mm/px · z∈[-224,-11]mm · 3 of 72 slices shown]
[im 1/72]
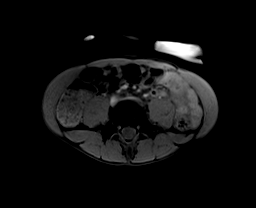
[im 36/72]
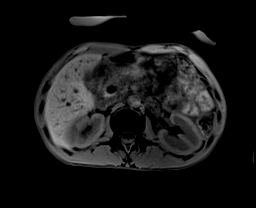
[im 72/72]
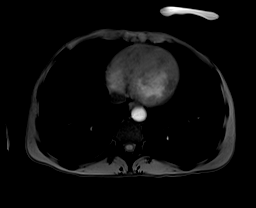

[Series 25: t1_vibe_e-dixon_tra_bh_pre_w_reg · axial · 3.0mm · 1.25mm/px · z∈[-224,-11]mm · 3 of 72 slices shown]
[im 1/72]
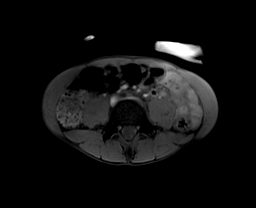
[im 36/72]
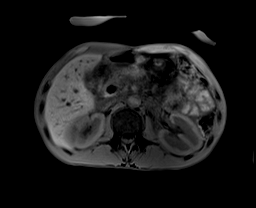
[im 72/72]
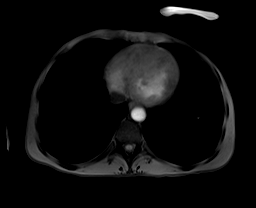

[Series 26: t1_vibe_dixon_tra_bh_arterial_w_reg · axial · 3.0mm · 1.25mm/px · z∈[-224,-11]mm · 3 of 72 slices shown]
[im 1/72]
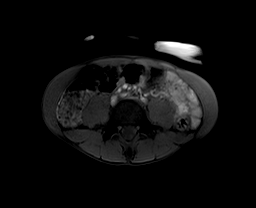
[im 36/72]
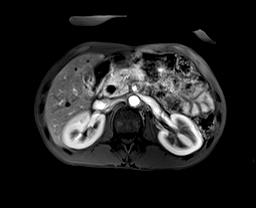
[im 72/72]
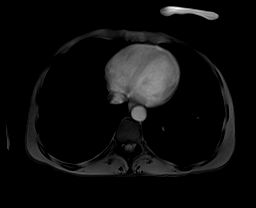

[Series 27: t1_vibe_dixon_tra_bh_arterial_w_sub · axial · 3.0mm · 1.25mm/px · z∈[-224,-11]mm · 3 of 72 slices shown]
[im 1/72]
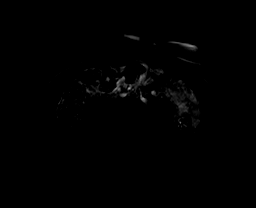
[im 36/72]
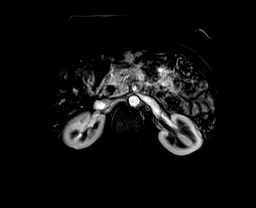
[im 72/72]
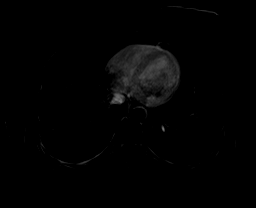

[Series 28: t1_vibe_dixon_tra_bh_venous_w_reg · axial · 3.0mm · 1.25mm/px · z∈[-224,-11]mm · 3 of 72 slices shown]
[im 1/72]
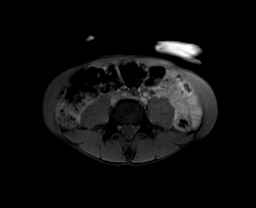
[im 36/72]
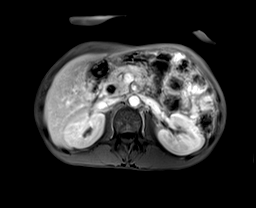
[im 72/72]
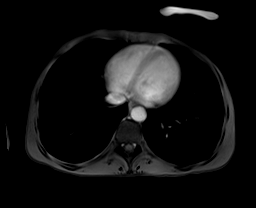

[Series 29: t1_vibe_dixon_tra_bh_venous_w_r_sub · axial · 3.0mm · 1.25mm/px · z∈[-224,-11]mm · 3 of 72 slices shown]
[im 1/72]
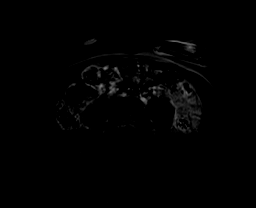
[im 36/72]
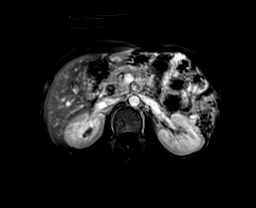
[im 72/72]
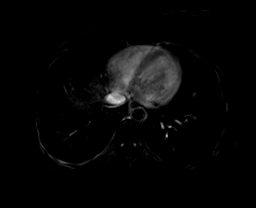

[Series 30: T2 · axial · 6.0mm · 1.29mm/px · 1 of 35 slices shown]
[im 1/35]
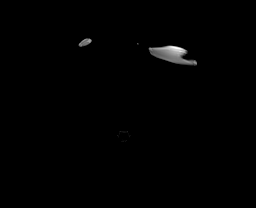

[Series 31: t1_vibe_dixon_tra_bh_delayed_w_reg · axial · 3.0mm · 1.25mm/px · z∈[-224,-11]mm · 3 of 72 slices shown]
[im 1/72]
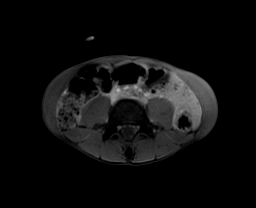
[im 36/72]
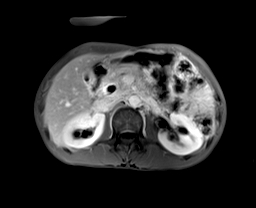
[im 72/72]
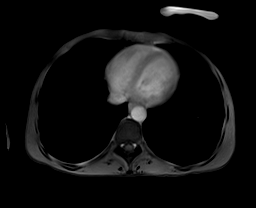

[Series 32: t1_vibe_dixon_tra_bh_delayed_w__sub · axial · 3.0mm · 1.25mm/px · z∈[-224,-11]mm · 3 of 72 slices shown]
[im 1/72]
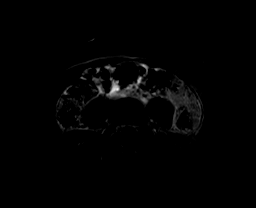
[im 36/72]
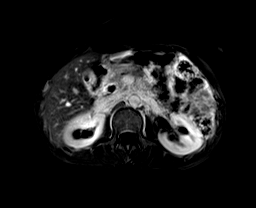
[im 72/72]
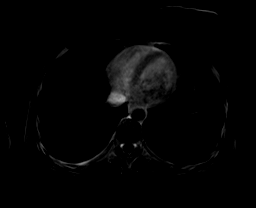

[Series 34: t1_vibe_dixon_cor_bh_post_w · coronal · 3.0mm · 1.76mm/px · 2 of 64 slices shown]
[im 1/64]
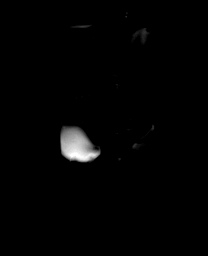
[im 64/64]
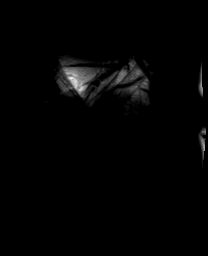

[Series 35: t1_vibe_dixon_tra_bh_3 min_w_reg · axial · 3.0mm · 1.25mm/px · z∈[-224,-11]mm · 3 of 72 slices shown]
[im 1/72]
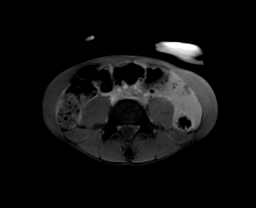
[im 36/72]
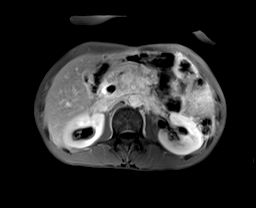
[im 72/72]
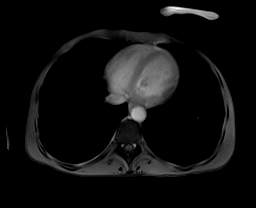

[Series 36: t1_vibe_dixon_tra_bh_3 min_w_re_sub · axial · 3.0mm · 1.25mm/px · z∈[-224,-11]mm · 3 of 72 slices shown]
[im 1/72]
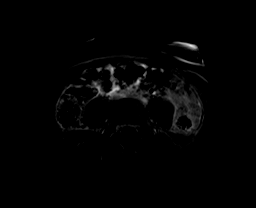
[im 36/72]
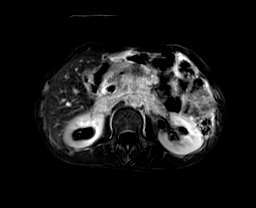
[im 72/72]
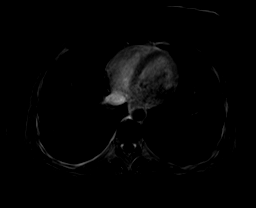

[48 of 48 positions shown; findings below may reference images not displayed]

FINDINGS: Mild multifactorial degradation, including motion, paucity of
abdominal fat.

Lower chest: Normal heart size without pericardial or pleural
effusion. bibasilar peripheral predominant pulmonary nodularity is
suboptimally evaluated.

Hepatobiliary: Segment 8 5 mm hepatic cyst. Scattered areas of
arterial phase hepatic hyperenhancement, including within segment 4
on images 24 and 30 of series 26. Within the inferior right hepatic
lobe on 46/26. These areas are favored to represent perfusion
anomalies, given absence of T2 signal abnormality or correlate
abnormality on later post-contrast images. Normal gallbladder.
Placement of a common duct stent. Persistent mild left-sided
intrahepatic duct dilatation, including on 25/05/33. The stent
terminates in the descending duodenum.

Pancreas: The pancreatic primary is difficult to delineate secondary
to is non border deforming morphology. Identified within the
body/neck junction on [DATE], as evidenced by an area of abrupt ductal
caught off. Abnormal soft tissue extending along the celiac,
including on [DATE] and 32/2 of the prior CT, consistent with direct
tumor extension. Suggestion of peripancreatic edema adjacent the
head, including on [DATE].

Spleen:  Normal in size, without focal abnormality.

Adrenals/Urinary Tract: Normal adrenal glands. Normal kidneys,
without hydronephrosis.

Stomach/Bowel: Grossly normal stomach and abdominal bowel loops.

Vascular/Lymphatic: Aortic atherosclerosis. Narrowing of the portal
vein, including on 32/28. Celiac and SMA patent. No gross abdominal
adenopathy.

Other:  No ascites.

Musculoskeletal: No acute osseous abnormality.
IMPRESSION: 1. Multifactorial degradation, including motion and paucity of
intra-abdominal fat.
2. Heterogeneous hepatic hyperenhancement, favored to represent
perfusion anomalies in the setting of portal vein narrowing. No
convincing evidence of hepatic metastasis.
3. Locally advanced pancreatic carcinoma, suboptimally evaluated.
4. Placement of a common duct stent on 09/13/2019. Persistent mild
intrahepatic biliary duct dilatation. Subtle peripancreatic edema,
for which pancreatitis should be excluded clinically.
5.  Aortic Atherosclerosis ([id number]).

## 2023-02-23 NOTE — Telephone Encounter (Signed)
Telephone call
